# Patient Record
Sex: Male | Born: 1950
Health system: Southern US, Community
[De-identification: ages and names within clinical notes are randomized; demographics above are authoritative.]

## PROBLEM LIST (undated history)

## (undated) DIAGNOSIS — I2510 Atherosclerotic heart disease of native coronary artery without angina pectoris: Secondary | ICD-10-CM

## (undated) DIAGNOSIS — T4145XA Adverse effect of unspecified anesthetic, initial encounter: Secondary | ICD-10-CM

## (undated) DIAGNOSIS — I739 Peripheral vascular disease, unspecified: Secondary | ICD-10-CM

## (undated) DIAGNOSIS — H269 Unspecified cataract: Secondary | ICD-10-CM

## (undated) DIAGNOSIS — K219 Gastro-esophageal reflux disease without esophagitis: Secondary | ICD-10-CM

## (undated) DIAGNOSIS — C801 Malignant (primary) neoplasm, unspecified: Secondary | ICD-10-CM

## (undated) DIAGNOSIS — E119 Type 2 diabetes mellitus without complications: Secondary | ICD-10-CM

## (undated) DIAGNOSIS — E785 Hyperlipidemia, unspecified: Secondary | ICD-10-CM

## (undated) DIAGNOSIS — M1990 Unspecified osteoarthritis, unspecified site: Secondary | ICD-10-CM

## (undated) DIAGNOSIS — I10 Essential (primary) hypertension: Secondary | ICD-10-CM

## (undated) DIAGNOSIS — T8859XA Other complications of anesthesia, initial encounter: Secondary | ICD-10-CM

## (undated) DIAGNOSIS — F419 Anxiety disorder, unspecified: Secondary | ICD-10-CM

## (undated) DIAGNOSIS — G4730 Sleep apnea, unspecified: Secondary | ICD-10-CM

## (undated) HISTORY — DX: Unspecified cataract: H26.9

## (undated) HISTORY — PX: TONSILLECTOMY: SUR1361

## (undated) HISTORY — DX: Unspecified osteoarthritis, unspecified site: M19.90

## (undated) HISTORY — DX: Anxiety disorder, unspecified: F41.9

## (undated) HISTORY — DX: Peripheral vascular disease, unspecified: I73.9

## (undated) HISTORY — PX: MENISCUS REPAIR: SHX5179

## (undated) HISTORY — DX: Atherosclerotic heart disease of native coronary artery without angina pectoris: I25.10

## (undated) HISTORY — PX: CARDIAC SURGERY: SHX584

---

## 1974-10-25 HISTORY — PX: NASAL SINUS SURGERY: SHX719

## 2004-11-25 HISTORY — PX: CORONARY ANGIOPLASTY WITH STENT PLACEMENT: SHX49

## 2014-12-31 ENCOUNTER — Encounter: Payer: Self-pay | Admitting: *Deleted

## 2014-12-31 ENCOUNTER — Emergency Department
Admission: EM | Admit: 2014-12-31 | Discharge: 2014-12-31 | Disposition: A | Discharge status: Home / Self Care | Payer: 59 | Source: Home / Self Care | Attending: Emergency Medicine | Admitting: Emergency Medicine

## 2014-12-31 DIAGNOSIS — J0101 Acute recurrent maxillary sinusitis: Secondary | ICD-10-CM

## 2014-12-31 HISTORY — DX: Type 2 diabetes mellitus without complications: E11.9

## 2014-12-31 HISTORY — DX: Essential (primary) hypertension: I10

## 2014-12-31 HISTORY — DX: Hyperlipidemia, unspecified: E78.5

## 2014-12-31 HISTORY — DX: Gastro-esophageal reflux disease without esophagitis: K21.9

## 2014-12-31 MED ORDER — METHYLPREDNISOLONE 4 MG PO KIT: PACK | ORAL | Status: DC

## 2014-12-31 MED ORDER — TRIAMCINOLONE ACETONIDE 55 MCG/ACT NA AERO: 2.0000 | INHALATION_SPRAY | Freq: Two times a day (BID) | NASAL | Status: DC

## 2014-12-31 MED ORDER — CEFDINIR 300 MG PO CAPS: 300.0000 mg | ORAL_CAPSULE | Freq: Two times a day (BID) | ORAL | Status: DC

## 2014-12-31 NOTE — ED Notes (Signed)
Pt c/o nasal congestion x 3 days. He has taken OTC Coricidin and generic mucus relief with minimal relief.

## 2014-12-31 NOTE — ED Provider Notes (Signed)
CSN: [id number]     Arrival date & time 12/31/14  6283 History   First MD Initiated Contact with Patient 12/31/14 1014     Chief Complaint  Patient presents with  . Nasal Congestion   HPI SINUSITIS  Onset: 3 days Bilateral Facial/sinus pressure with discolored, green nasal mucus.    Severity: Severe Tried OTC Coricidin and Mucinex without significant relief. He admits that he is chronically allergic to Afrin nasal spray for years, using once daily or more often for many years. During this acute sinus episode, he is using Afrin nasal spray every 2-3 hours around the clock.  In the past has had recurrent sinusitis.  Associated Symptoms:  + Fever  + URI prodrome with nasal congestion + Minimal swollen neck glands + Moderate Sinus Headache + mild ear pressure  Review of systems:  No Allergy symptoms + Minimal Sore Throat No eye symptoms     No significant Cough No chest pain No shortness of breath  No wheezing  No Abdominal Pain No Nausea No Vomiting No diarrhea  No Myalgias No focal neurologic symptoms No syncope No Rash  No Urinary symptoms    Past Medical History  Diagnosis Date  . Diabetes mellitus without complication   . Hypertension   . Hyperlipidemia   . GERD (gastroesophageal reflux disease)    Past Surgical History  Procedure Laterality Date  . Tonsillectomy    . Meniscus repair Right   . Nasal sinus surgery    . Cardiac surgery     Family History  Problem Relation Age of Onset  . Diabetes Mother   . Hypertension Mother   . Hyperlipidemia Mother   . Alcohol abuse Brother    History  Substance Use Topics  . Smoking status: Former Research scientist (life sciences)  . Smokeless tobacco: Not on file  . Alcohol Use: Yes     Comment: 2-4 q year    Review of Systems See above Allergies  Dexilant  Home Medications   Prior to Admission medications   Medication Sig Start Date End Date Taking? Authorizing Provider  amLODipine (NORVASC) 5 MG tablet Take 5 mg by  mouth daily.   Yes Historical Provider, MD  aspirin 81 MG tablet Take 81 mg by mouth daily.   Yes Historical Provider, MD  atorvastatin (LIPITOR) 80 MG tablet Take 80 mg by mouth daily.   Yes Historical Provider, MD  busPIRone (BUSPAR) 10 MG tablet Take 10 mg by mouth 3 (three) times daily.   Yes Historical Provider, MD  carvedilol (COREG) 25 MG tablet Take 25 mg by mouth 2 (two) times daily with a meal.   Yes Historical Provider, MD  Cholecalciferol (VITAMIN D-3 PO) Take by mouth.   Yes Historical Provider, MD  clopidogrel (PLAVIX) 75 MG tablet Take 75 mg by mouth daily.   Yes Historical Provider, MD  co-enzyme Q-10 30 MG capsule Take 30 mg by mouth 3 (three) times daily.   Yes Historical Provider, MD  Cyanocobalamin (VITAMIN B-12) 2500 MCG SUBL Place under the tongue.   Yes Historical Provider, MD  fenofibrate (TRICOR) 145 MG tablet Take 145 mg by mouth daily.   Yes Historical Provider, MD  finasteride (PROSCAR) 5 MG tablet Take 5 mg by mouth daily.   Yes Historical Provider, MD  Fish Oil-Cholecalciferol (FISH OIL + D3 PO) Take by mouth.   Yes Historical Provider, MD  lisinopril (PRINIVIL,ZESTRIL) 40 MG tablet Take 40 mg by mouth daily.   Yes Historical Provider, MD  metFORMIN (GLUCOPHAGE) 1000  MG tablet Take 1,000 mg by mouth 2 (two) times daily with a meal.   Yes Historical Provider, MD  metoprolol succinate (TOPROL-XL) 50 MG 24 hr tablet Take 50 mg by mouth daily. Take with or immediately following a meal.   Yes Historical Provider, MD  MULTIPLE VITAMIN PO Take by mouth.   Yes Historical Provider, MD  pantoprazole (PROTONIX) 40 MG tablet Take 40 mg by mouth daily.   Yes Historical Provider, MD  ranitidine (ZANTAC) 300 MG capsule Take 300 mg by mouth every evening.   Yes Historical Provider, MD  cefdinir (OMNICEF) 300 MG capsule Take 1 capsule (300 mg total) by mouth 2 (two) times daily. X 10 days 12/31/14   Jacqulyn Cane, MD  methylPREDNISolone (MEDROL DOSEPAK) 4 MG tablet Take as directed for 6  days 12/31/14   Jacqulyn Cane, MD  triamcinolone (NASACORT ALLERGY 24HR) 55 MCG/ACT AERO nasal inhaler Place 2 sprays into the nose 2 (two) times daily. 12/31/14   Jacqulyn Cane, MD   BP 139/86 mmHg  Pulse 104  Temp(Src) 98.1 F (36.7 C) (Oral)  Resp 18  Ht 5\' 9"  (1.753 m)  Wt 278 lb (126.1 kg)  BMI 41.03 kg/m2  SpO2 96% Physical Exam  Constitutional: He is oriented to person, place, and time. He appears well-developed and well-nourished. No distress.  HENT:  Head: Normocephalic and atraumatic.  Right Ear: Tympanic membrane, external ear and ear canal normal.  Left Ear: Tympanic membrane, external ear and ear canal normal.  Nose: Mucosal edema and rhinorrhea present. Right sinus exhibits maxillary sinus tenderness. Left sinus exhibits maxillary sinus tenderness.  Mouth/Throat: Oropharynx is clear and moist. No oral lesions. No oropharyngeal exudate.  Eyes: Right eye exhibits no discharge. Left eye exhibits no discharge. No scleral icterus.  Neck: Neck supple.  Cardiovascular: Normal rate, regular rhythm and normal heart sounds.   Pulmonary/Chest: Effort normal and breath sounds normal. He has no wheezes. He has no rales.  Lymphadenopathy:    He has no cervical adenopathy.  Neurological: He is alert and oriented to person, place, and time.  Skin: Skin is warm and dry.  Nursing note and vitals reviewed.   ED Course  Procedures (including critical care time) Labs Review Labs Reviewed - No data to display  Imaging Review No results found.   MDM   1. Acute recurrent maxillary sinusitis   Treatment options discussed, as well as risks, benefits, alternatives. He declined any imaging today. Patient voiced understanding and agreement with the following plans:  Omnicef 300 mg twice a day 10 days Medrol 4 mg tablet-6 day Dosepak--given his severe symptoms and Afrin addiction, I feel the benefits of this outweigh the risks. (I advised steroid nasal spray such as Flonase. He declined  Flonase specifically because of the odor of that product) Therefore, prescribed Nasacort nasal spray, 2 sprays each nostril twice a day for 1 week, then decrease to 2 sprays each nostril daily.--I explained that it takes several days for the Nasacort nasal spray to achieve benefit. Wean off chronic Afrin nasal spray. Follow-up with ENT in 7-10 days, sooner if worse or new symptoms.  Jacqulyn Cane, MD 12/31/14 [phone]

## 2015-01-06 ENCOUNTER — Ambulatory Visit (INDEPENDENT_AMBULATORY_CARE_PROVIDER_SITE_OTHER): Payer: 59 | Admitting: Family Medicine

## 2015-01-06 ENCOUNTER — Encounter: Payer: Self-pay | Admitting: Family Medicine

## 2015-01-06 VITALS — BP 149/87 | HR 74 | Wt 277.0 lb

## 2015-01-06 DIAGNOSIS — I739 Peripheral vascular disease, unspecified: Secondary | ICD-10-CM | POA: Insufficient documentation

## 2015-01-06 DIAGNOSIS — Z955 Presence of coronary angioplasty implant and graft: Secondary | ICD-10-CM

## 2015-01-06 DIAGNOSIS — I2510 Atherosclerotic heart disease of native coronary artery without angina pectoris: Secondary | ICD-10-CM | POA: Insufficient documentation

## 2015-01-06 DIAGNOSIS — E119 Type 2 diabetes mellitus without complications: Secondary | ICD-10-CM

## 2015-01-06 DIAGNOSIS — I10 Essential (primary) hypertension: Secondary | ICD-10-CM

## 2015-01-06 DIAGNOSIS — E785 Hyperlipidemia, unspecified: Secondary | ICD-10-CM

## 2015-01-06 MED ORDER — CILOSTAZOL 100 MG PO TABS: 100.0000 mg | ORAL_TABLET | Freq: Two times a day (BID) | ORAL | Status: DC

## 2015-01-06 NOTE — Progress Notes (Signed)
CC: David Singh is a 64 y.o. male is here for Establish Care   Subjective: HPI:  Very pleasant 64 year old here to establish care  Patient reports a history of type 2 diabetes: He is uncertain when this was originally diagnosed. He's been taking metformin twice a day for at least a year now. He believes his A1c was checked 3 months ago and he is uncertain what the results were. No polyuria polyphagia or polydipsia. He tries to exercise on a daily basis but is limited by limb claudication  Reports a history of coronary artery disease requiring a stent many years ago. He was seen a cardiologist in [place] every 6 months and was getting at least an annual nuclear stress test he reports that all of them have been stable over the past 1 or 2 years. Denies any exertional chest pain or chest pain in general.  He tells me that he was administered with peripheral artery disease in the femoral artery on the right about 3 months ago. After 1.5 years of exertional buttock and thigh aching with walking he was seen by a vascular surgeon 3 months ago and was told that he wasn't walking enough. He tells me that within 1 or 2 minutes of walking on a hill he has to stop for a few minutes to let the pain subside. He believes that the pain is getting worse on a monthly basis. Denies any rest pain. No interventions other than above. He takes Plavix and statin.  Reports a history of hyperlipidemia. He is uncertain when his cholesterol was checked last.  He is on 80 mg of Lipitor without any right upper quadrant pain or myalgias other than the discomfort described above.  Essential hypertension: He is currently taking amlodipine, metoprolol, lisinopril on a daily basis. His medication list contains carvedilol however he is uncertain whether or not he is taking this at home.  Review of Systems - General ROS: negative for - chills, fever, night sweats, weight gain or weight loss Ophthalmic ROS: negative for - decreased  vision Psychological ROS: negative for - anxiety or depression ENT ROS: negative for - hearing change, nasal congestion, tinnitus or allergies Hematological and Lymphatic ROS: negative for - bleeding problems, bruising or swollen lymph nodes Breast ROS: negative Respiratory ROS: no cough, shortness of breath, or wheezing Cardiovascular ROS: no chest pain or dyspnea on exertion Gastrointestinal ROS: no abdominal pain, change in bowel habits, or black or bloody stools Genito-Urinary ROS: negative for - genital discharge, genital ulcers, incontinence or abnormal bleeding from genitals Musculoskeletal ROS: negative for - joint pain  Neurological ROS: negative for - headaches or memory loss Dermatological ROS: negative for lumps, mole changes, rash and skin lesion changes  Past Medical History  Diagnosis Date  . Diabetes mellitus without complication   . Hypertension   . Hyperlipidemia   . GERD (gastroesophageal reflux disease)     Past Surgical History  Procedure Laterality Date  . Tonsillectomy    . Meniscus repair Right   . Nasal sinus surgery    . Cardiac surgery     Family History  Problem Relation Age of Onset  . Diabetes Mother   . Hypertension Mother   . Hyperlipidemia Mother   . Alcohol abuse Brother     History   Social History  . Marital Status: Married    Spouse Name: N/A  . Number of Children: N/A  . Years of Education: N/A   Occupational History  . Not on file.  Social History Main Topics  . Smoking status: Former Research scientist (life sciences)  . Smokeless tobacco: Not on file  . Alcohol Use: Yes     Comment: 2-4 q year  . Drug Use: No  . Sexual Activity: Not on file   Other Topics Concern  . Not on file   Social History Narrative     Objective: BP 149/87 mmHg  Pulse 74  Wt 277 lb (125.646 kg)  General: Alert and Oriented, No Acute Distress HEENT: Pupils equal, round, reactive to light. Conjunctivae clear.  Moist mucous membranes Lungs: Clear to auscultation  bilaterally, no wheezing/ronchi/rales.  Comfortable work of breathing. Good air movement. Cardiac: Regular rate and rhythm. Normal S1/S2.  No murmurs, rubs, nor gallops. No carotid bruit  Abdomen: Obese and soft Extremities: No peripheral edema.  Faint peripheral pulses in the lower extremities.  Mental Status: No depression, anxiety, nor agitation. Skin: Warm and dry.  Assessment & Plan: Johathon was seen today for establish care.  Diagnoses and all orders for this visit:  Type 2 diabetes mellitus without complication Orders: -     BASIC METABOLIC PANEL WITH GFR -     Hemoglobin A1c  CAD in native artery Orders: -     Ambulatory referral to Cardiology  History of coronary artery stent placement Orders: -     Ambulatory referral to Cardiology  PAD (peripheral artery disease) Orders: -     Lipid panel  Hyperlipidemia Orders: -     Lipid panel  Essential hypertension  Bilateral claudication of lower limb Orders: -     Ambulatory referral to Vascular Surgery -     cilostazol (PLETAL) 100 MG tablet; Take 1 tablet (100 mg total) by mouth 2 (two) times daily.   Type 2 diabetes: Clinically controlled due for A1c and renal function. He is not fasting today but will return tomorrow morning Coronary artery disease: Currently stable referral to cardiology for further management. Continue beta blocker, statin, Plavix Peripheral artery disease with symptomatic claudication. Starting pletal, encouraged to continue walking and referral to vascular surgery for a second opinion, if the above medication does not help I think that he may need stenting in the lower extremities. Essential hypertension: Uncontrolled, discussed DASH diet prior to adjusting antihypertensives. I've asked him to stop taking carvedilol if he is taking metoprolol. Advised that he does not need to take ranitidine if he is taking Protonix and reflux symptoms are controlled with Protonix alone. Hyperlipidemia: Overdue  for lipid panel continue atorvastatin pending results, of note he cannot afford fenofibrate  Return in about 3 months (around 04/08/2015).

## 2015-01-10 ENCOUNTER — Other Ambulatory Visit: Payer: Self-pay

## 2015-01-10 DIAGNOSIS — I739 Peripheral vascular disease, unspecified: Secondary | ICD-10-CM

## 2015-01-16 ENCOUNTER — Telehealth: Payer: Self-pay | Admitting: Family Medicine

## 2015-01-16 DIAGNOSIS — E781 Pure hyperglyceridemia: Secondary | ICD-10-CM

## 2015-01-16 DIAGNOSIS — E785 Hyperlipidemia, unspecified: Secondary | ICD-10-CM

## 2015-01-16 LAB — BASIC METABOLIC PANEL WITH GFR
BUN: 13 mg/dL (ref 6–23)
CHLORIDE: 100 meq/L (ref 96–112)
CO2: 28 meq/L (ref 19–32)
Calcium: 9.4 mg/dL (ref 8.4–10.5)
Creat: 0.75 mg/dL (ref 0.50–1.35)
GFR, Est Non African American: >89 mL/min
GLUCOSE: 115 mg/dL — AB (ref 70–99)
POTASSIUM: 4.4 meq/L (ref 3.5–5.3)
SODIUM: 139 meq/L (ref 135–145)

## 2015-01-16 LAB — LIPID PANEL
CHOL/HDL RATIO: 4.7 ratio
Cholesterol: 159 mg/dL (ref 0–200)
HDL: 34 mg/dL — ABNORMAL LOW (ref >=40)
LDL Cholesterol: 75 mg/dL (ref 0–99)
Triglycerides: 252 mg/dL — ABNORMAL HIGH (ref <150)
VLDL: 50 mg/dL — ABNORMAL HIGH (ref 0–40)

## 2015-01-16 LAB — HEMOGLOBIN A1C
HEMOGLOBIN A1C: 6.8 % — AB (ref <5.7)
Mean Plasma Glucose: 148 mg/dL — ABNORMAL HIGH (ref <117)

## 2015-01-16 MED ORDER — ICOSAPENT ETHYL 1 G PO CAPS: 2.0000 g | ORAL_CAPSULE | Freq: Two times a day (BID) | ORAL | Status: DC

## 2015-01-16 NOTE — Telephone Encounter (Signed)
Andrea/Coverage, Will you please let patient know that his A1c was at goal at 6.8, liver and kidney function were normal.  LDL cholesterol was satisfactory and triglycerides were moderately elevated.  I know he's already taking fish oil but I'd recommend switching to a different formulation of fish oil that's more purified.  It's a Rx named Vascepa which I've sent to his [facility], this usually makes a huge difference for an individual's triglycerides.  It's best to get this Rx with a savings voucher we have here in the closet, can you please provide him with one of these?

## 2015-01-21 NOTE — Telephone Encounter (Signed)
Pt notified and he states the medication for his circulation is not helping FYI

## 2015-01-21 NOTE — Telephone Encounter (Signed)
He may as well stop taking it then as long as he's referring to cliostazol.  I still think it's very important to meet with the vascular surgeon and I referred him to earlier this month, please let me know if he is not been notified by now regarding a appointment.

## 2015-01-22 NOTE — Telephone Encounter (Signed)
Left message on vm

## 2015-01-23 ENCOUNTER — Telehealth: Payer: Self-pay | Admitting: Family Medicine

## 2015-01-23 NOTE — Telephone Encounter (Signed)
Received fax for prior auth on Vascepa 1 gm capsules sent through cover my meds waiting on auth. - CF

## 2015-01-24 ENCOUNTER — Telehealth: Payer: Self-pay | Admitting: *Deleted

## 2015-01-24 ENCOUNTER — Other Ambulatory Visit: Payer: Self-pay | Admitting: Family Medicine

## 2015-01-24 MED ORDER — FINASTERIDE 5 MG PO TABS: 5.0000 mg | ORAL_TABLET | Freq: Every day | ORAL | Status: DC

## 2015-01-24 NOTE — Telephone Encounter (Signed)
Patient called wondering if he would be still on Finasteride 5 mg. I called the patient to inform that   Dr Ileene Rubens wasn't the prescribing doctor & that I am sending medication refill request.

## 2015-01-24 NOTE — Telephone Encounter (Signed)
REfill appropriate, I sent this to his [facility] in [place].

## 2015-01-24 NOTE — Telephone Encounter (Signed)
Pharmacy sent fax for refill on Proscar. Please advise if this medication is appropriate as it was entered by a historical provider.

## 2015-01-27 ENCOUNTER — Telehealth: Payer: Self-pay | Admitting: Family Medicine

## 2015-01-27 DIAGNOSIS — E781 Pure hyperglyceridemia: Secondary | ICD-10-CM

## 2015-01-27 MED ORDER — NIACIN ER (ANTIHYPERLIPIDEMIC) 500 MG PO TBCR: EXTENDED_RELEASE_TABLET | ORAL | Status: DC

## 2015-01-27 NOTE — Telephone Encounter (Signed)
Left message on vm

## 2015-01-27 NOTE — Telephone Encounter (Signed)
Received fax on vascepa and medication has been denied - CF

## 2015-01-27 NOTE — Telephone Encounter (Signed)
David Singh, Will you please let patient know that [place] is not providing coverage for Vascepa.  To avoid having to pay hundreds of dollars out of pocket I'd reocmmend we try a different agent to lower his triglycerides.  I've sent an Rx of niacin to [facility] to help with this, he can go back to taking regular OTC fish oil with this medication.

## 2015-01-30 ENCOUNTER — Telehealth: Payer: Self-pay | Admitting: *Deleted

## 2015-01-30 NOTE — Telephone Encounter (Signed)
Pt called and states the Niacin in still $100 out of pocket. He called his insurance company  And colestipol is only $10 out of pocket. He wanted to know if this could be rx;ed instead

## 2015-01-31 MED ORDER — COLESTIPOL HCL 5 G PO GRAN: 5.0000 g | GRANULES | Freq: Every day | ORAL | Status: DC

## 2015-01-31 NOTE — Telephone Encounter (Signed)
Pt notified. Pt was to see a cardiovascular surgeon and at the last min the office canceled his appt and referred him to a Dr. Bobette Mo. He wanted hommel to know this

## 2015-01-31 NOTE — Telephone Encounter (Signed)
Yes this is an acceptable alternative, Rx sent to [facility] in [place].

## 2015-02-12 ENCOUNTER — Ambulatory Visit: Payer: 59 | Admitting: Cardiology

## 2015-02-13 ENCOUNTER — Encounter: Payer: Self-pay | Admitting: Vascular Surgery

## 2015-02-14 ENCOUNTER — Ambulatory Visit (INDEPENDENT_AMBULATORY_CARE_PROVIDER_SITE_OTHER): Payer: 59 | Admitting: Vascular Surgery

## 2015-02-14 ENCOUNTER — Ambulatory Visit (INDEPENDENT_AMBULATORY_CARE_PROVIDER_SITE_OTHER)
Admission: RE | Admit: 2015-02-14 | Discharge: 2015-02-14 | Disposition: A | Discharge status: Home / Self Care | Payer: 59 | Source: Ambulatory Visit | Attending: Vascular Surgery | Admitting: Vascular Surgery

## 2015-02-14 ENCOUNTER — Encounter: Payer: Self-pay | Admitting: Vascular Surgery

## 2015-02-14 ENCOUNTER — Ambulatory Visit (HOSPITAL_COMMUNITY)
Admission: RE | Admit: 2015-02-14 | Discharge: 2015-02-14 | Disposition: A | Discharge status: Home / Self Care | Payer: 59 | Source: Ambulatory Visit | Attending: Vascular Surgery | Admitting: Vascular Surgery

## 2015-02-14 VITALS — Ht 69.0 in | Wt 280.3 lb

## 2015-02-14 DIAGNOSIS — I70219 Atherosclerosis of native arteries of extremities with intermittent claudication, unspecified extremity: Secondary | ICD-10-CM

## 2015-02-14 DIAGNOSIS — I739 Peripheral vascular disease, unspecified: Secondary | ICD-10-CM

## 2015-02-14 NOTE — Progress Notes (Signed)
[facility]  Patient name: David Singh MRN: [id number] DOB: 10-18-1951 Sex: male  REASON FOR CONSULT: Claudication. Referred by Dr. Nicoletta Ba.  HPI: David Singh is a 64 y.o. male with a long history of claudication. He experiences significant pain in his right thigh initially and this later involves his hips until a lesser degree his right calf. His symptoms are brought on by ambulation and relieved with rest. He experiences some symptoms on the left however his symptoms are predominantly on the right side. He states he can walk approximately the distance of 3-4 houses for experiencing symptoms. The symptoms have gradually progressed. There are no other aggravating or alleviating factors. He denies any history of rest pain. He denies any history of nonhealing ulcers. He was on Pletal for 2 weeks but did not think that this helped and therefore stopped taking this.  I reviewed the patient's records from Dr. Lewis Moccasin office. The patient has type 2 diabetes. This has been under good control. The patient does have a history of coronary artery disease with no recent symptoms. The patient also has essential hypertension. The patient is on Pletal for his peripheral vascular disease.   Past Medical History  Diagnosis Date  . Diabetes mellitus without complication   . Hypertension   . Hyperlipidemia   . GERD (gastroesophageal reflux disease)   . Peripheral vascular disease    Family History  Problem Relation Age of Onset  . Diabetes Mother   . Hypertension Mother   . Hyperlipidemia Mother   . Heart attack Mother   . Alcohol abuse Brother   . Cancer Brother   . Diabetes Brother   . Hyperlipidemia Brother   . Hypertension Brother   . Hyperlipidemia Father   . Hypertension Father    SOCIAL HISTORY: History  Substance Use Topics  . Smoking status: Former Research scientist (life sciences)  . Smokeless tobacco: Not on file  . Alcohol Use: 0.0 oz/week    0 Standard drinks or  equivalent per week     Comment: 2-8 times per year   Allergies  Allergen Reactions  . Dexilant [Dexlansoprazole]     Pressure in stomach and chest   Current Outpatient Prescriptions  Medication Sig Dispense Refill  . amLODipine (NORVASC) 5 MG tablet Take 5 mg by mouth daily.    Marland Kitchen aspirin 81 MG tablet Take 81 mg by mouth daily.    Marland Kitchen atorvastatin (LIPITOR) 80 MG tablet Take 80 mg by mouth daily.    . busPIRone (BUSPAR) 10 MG tablet Take 10 mg by mouth 3 (three) times daily.    . Cholecalciferol (VITAMIN D-3 PO) Take by mouth.    . cilostazol (PLETAL) 100 MG tablet Take 1 tablet (100 mg total) by mouth 2 (two) times daily. 60 tablet 2  . clopidogrel (PLAVIX) 75 MG tablet Take 75 mg by mouth daily.    Marland Kitchen co-enzyme Q-10 30 MG capsule Take 30 mg by mouth 3 (three) times daily.    . colestipol (COLESTID) 5 G granules Take 5 g by mouth daily. 300 g 12  . Cyanocobalamin (VITAMIN B-12) 2500 MCG SUBL Place under the tongue.    . finasteride (PROSCAR) 5 MG tablet Take 1 tablet (5 mg total) by mouth daily. 90 tablet 1  . Fish Oil-Cholecalciferol (FISH OIL + D3 PO) Take by mouth.    Marland Kitchen lisinopril (PRINIVIL,ZESTRIL) 40 MG tablet Take 40 mg by mouth daily.    . metFORMIN (GLUCOPHAGE) 1000 MG tablet Take 1,000 mg by  mouth 2 (two) times daily with a meal.    . metoprolol succinate (TOPROL-XL) 50 MG 24 hr tablet Take 50 mg by mouth daily. Take with or immediately following a meal.    . MULTIPLE VITAMIN PO Take by mouth.    . pantoprazole (PROTONIX) 40 MG tablet Take 40 mg by mouth daily.    Marland Kitchen triamcinolone (NASACORT ALLERGY 24HR) 55 MCG/ACT AERO nasal inhaler Place 2 sprays into the nose 2 (two) times daily. 1 Inhaler 12   No current facility-administered medications for this visit.   REVIEW OF SYSTEMS: Valu.Nieves ] denotes positive finding; [  ] denotes negative finding  CARDIOVASCULAR:  [ ]  chest pain   [ ]  chest pressure   [ ]  palpitations   [ ]  orthopnea   Valu.Nieves ] dyspnea on exertion   Valu.Nieves ] claudication   [ ]   rest pain   [ ]  DVT   [ ]  phlebitis PULMONARY:   [ ]  productive cough   [ ]  asthma   [ ]  wheezing NEUROLOGIC:   [ ]  weakness  [ ]  paresthesias  [ ]  aphasia  [ ]  amaurosis  [ ]  dizziness HEMATOLOGIC:   [ ]  bleeding problems   [ ]  clotting disorders MUSCULOSKELETAL:  [ ]  joint pain   [ ]  joint swelling [ ]  leg swelling GASTROINTESTINAL: [ ]   blood in stool  [ ]   hematemesis GENITOURINARY:  [ ]   dysuria  [ ]   hematuria PSYCHIATRIC:  [ ]  history of major depression INTEGUMENTARY:  [ ]  rashes  [ ]  ulcers CONSTITUTIONAL:  [ ]  fever   [ ]  chills  PHYSICAL EXAM: Filed Vitals:   02/14/15 1401  Height: 5\' 9"  (1.753 m)  Weight: 280 lb 4.8 oz (127.143 kg)   GENERAL: The patient is a well-nourished male, in no acute distress. The vital signs are documented above. CARDIOVASCULAR: There is a regular rate and rhythm. I do not detect carotid bruits. He has a diminished right femoral pulse. He has a normal left femoral pulse. On the left lower extremity has palpable popliteal posterior tibial and dorsalis pedis pulse. He has a barely palpable right dorsalis pedis pulse. He has no significant lower extremity swelling. PULMONARY: There is good air exchange bilaterally without wheezing or rales. ABDOMEN: Soft and non-tender with normal pitched bowel sounds. I do not palpate an abdominal aortic aneurysm although his abdomen is difficult to assess because of his size. MUSCULOSKELETAL: There are no major deformities or cyanosis. NEUROLOGIC: No focal weakness or paresthesias are detected. SKIN: There are no ulcers or rashes noted. PSYCHIATRIC: The patient has a normal affect.  DATA:  I have independently interpreted his arterial Doppler study today which shows an ABI of 86% on the right. At rest he has a biphasic posterior tibial and dorsalis pedis signal on the right. On the left side he has an ABI of 100% with biphasic signals in the dorsalis pedis and posterior tibial positions.  Independently interpreted his  duplex scan which shows no significant infrainguinal arterial occlusive disease. Of note he has a monophasic common femoral artery waveform on the right with a triphasic common femoral artery waveform on the left.  MEDICAL ISSUES: PERIPHERAL VASCULAR DISEASE WITH CLAUDICATION: Based on his exam and Doppler studies he has evidence of right iliac artery occlusive disease. Discussed the importance of a structured walking program. Fortunately he quit tobacco 20 years ago. I've explained that if his symptoms were significantly disabling we could consider arteriography in order to determine  if he has an iliac stenosis amenable to angioplasty. However currently he feels the symptoms are tolerable. I ordered a follow up study in 6 months and I will see him back at that time. He knows to call sooner if he has problems. We also discussed the potential use of Pletal although he did not think this was especially helpful. I did explain that he would have to stay on it for at least a month to really know if it was beneficial.  DICKSON,CHRISTOPHER S [facility] [place]: [phone]

## 2015-02-18 NOTE — Addendum Note (Signed)
Addended by: Mena Goes on: 02/18/2015 03:19 PM   Modules accepted: Orders

## 2015-02-25 ENCOUNTER — Encounter: Payer: 59 | Admitting: Vascular Surgery

## 2015-02-25 ENCOUNTER — Encounter (HOSPITAL_COMMUNITY): Payer: 59

## 2015-02-25 ENCOUNTER — Other Ambulatory Visit (HOSPITAL_COMMUNITY): Payer: 59

## 2015-03-17 ENCOUNTER — Other Ambulatory Visit: Payer: Self-pay | Admitting: Family Medicine

## 2015-03-17 MED ORDER — AMLODIPINE BESYLATE 5 MG PO TABS: 5.0000 mg | ORAL_TABLET | Freq: Every day | ORAL | Status: DC

## 2015-03-17 MED ORDER — ATORVASTATIN CALCIUM 80 MG PO TABS: 80.0000 mg | ORAL_TABLET | Freq: Every day | ORAL | Status: DC

## 2015-03-17 NOTE — Telephone Encounter (Signed)
David Singh, Appropriate, I've sent refills to [facility] in [place]

## 2015-03-17 NOTE — Telephone Encounter (Addendum)
Received fax from pharmacy for refill on Amlodipine and Lipitor. These Rx's were entered by a historical provider. Please advise if refill is appropriate, thank you.

## 2015-03-19 ENCOUNTER — Telehealth: Payer: Self-pay | Admitting: Family Medicine

## 2015-03-19 NOTE — Telephone Encounter (Signed)
New Message  We are sending this correspondence to inform your office that we have attempted to contact the patient on three separate attempts to schedule per referral. We were unsuccessful in our attempts to reschedule and wanted you to be aware of our efforts.  Thank you!  [facility]  Patient care coordinator

## 2015-04-08 ENCOUNTER — Ambulatory Visit (INDEPENDENT_AMBULATORY_CARE_PROVIDER_SITE_OTHER): Payer: 59 | Admitting: Family Medicine

## 2015-04-08 ENCOUNTER — Encounter: Payer: Self-pay | Admitting: Family Medicine

## 2015-04-08 VITALS — BP 147/86 | HR 89 | Wt 283.0 lb

## 2015-04-08 DIAGNOSIS — E119 Type 2 diabetes mellitus without complications: Secondary | ICD-10-CM

## 2015-04-08 DIAGNOSIS — I739 Peripheral vascular disease, unspecified: Secondary | ICD-10-CM

## 2015-04-08 DIAGNOSIS — E781 Pure hyperglyceridemia: Secondary | ICD-10-CM | POA: Diagnosis not present

## 2015-04-08 DIAGNOSIS — I10 Essential (primary) hypertension: Secondary | ICD-10-CM

## 2015-04-08 MED ORDER — AMLODIPINE BESYLATE 10 MG PO TABS: 10.0000 mg | ORAL_TABLET | Freq: Every day | ORAL | Status: DC

## 2015-04-08 MED ORDER — COLESTIPOL HCL 1 G PO TABS: 2.0000 g | ORAL_TABLET | Freq: Two times a day (BID) | ORAL | Status: DC

## 2015-04-08 NOTE — Progress Notes (Signed)
CC: David Singh is a 64 y.o. male is here for Establish Care   Subjective: HPI:  Follow-up type 2 diabetes: Continues to take metformin daily without known side effects. No outside blood sugars to report. No formal exercise routine but trying to watch what he eats. No polyuria polyphagia or polydipsia  Follow peripheral artery disease: He tried Pletal for at least a month but did not notice any benefit so he stopped taking it. He still has calf and thigh claudication if walking more than a quarter of a mile. He's had difficulty getting beyond a quarter to half a mile due to the degree of pain that he experiences at this distance even after resting. He denies any chest pain or shortness of breath.  Follow-up hypercholesterolemia: He was unable to consume colestid a regular basis due to gagging on the powder despite trying different liquids to dilute this in. He denies right upper quadrant pain or epigastric pain. No new back pain.   Review Of Systems Outlined In HPI  Past Medical History  Diagnosis Date  . Diabetes mellitus without complication   . Hypertension   . Hyperlipidemia   . GERD (gastroesophageal reflux disease)   . Peripheral vascular disease     Past Surgical History  Procedure Laterality Date  . Tonsillectomy    . Meniscus repair Right   . Nasal sinus surgery    . Cardiac surgery     Family History  Problem Relation Age of Onset  . Diabetes Mother   . Hypertension Mother   . Hyperlipidemia Mother   . Heart attack Mother   . Alcohol abuse Brother   . Cancer Brother   . Diabetes Brother   . Hyperlipidemia Brother   . Hypertension Brother   . Hyperlipidemia Father   . Hypertension Father     History   Social History  . Marital Status: Married    Spouse Name: N/A  . Number of Children: N/A  . Years of Education: N/A   Occupational History  . Not on file.   Social History Main Topics  . Smoking status: Former Research scientist (life sciences)  . Smokeless tobacco: Not on file   . Alcohol Use: 0.0 oz/week    0 Standard drinks or equivalent per week     Comment: 2-8 times per year  . Drug Use: No  . Sexual Activity: Not on file   Other Topics Concern  . Not on file   Social History Narrative     Objective: BP 147/86 mmHg  Pulse 89  Wt 283 lb (128.368 kg)  General: Alert and Oriented, No Acute Distress HEENT: Pupils equal, round, reactive to light. Conjunctivae clear.  Moist mucous membranes Lungs: Clear to auscultation bilaterally, no wheezing/ronchi/rales.  Comfortable work of breathing. Good air movement. Cardiac: Regular rate and rhythm. Normal S1/S2.  No murmurs, rubs, nor gallops.   Abdomen: Obese and soft Extremities: No peripheral edema.  Strong peripheral pulses.  Mental Status: No depression, anxiety, nor agitation. Skin: Warm and dry.  Assessment & Plan: David Singh was seen today for establish care.  Diagnoses and all orders for this visit:  Type 2 diabetes mellitus without complication Orders: -     Hemoglobin A1c  PAD (peripheral artery disease)  Hypertriglyceridemia Orders: -     Lipid panel  Essential hypertension  Other orders -     colestipol (COLESTID) 1 G tablet; Take 2 tablets (2 g total) by mouth 2 (two) times daily. -     amLODipine (NORVASC) 10  MG tablet; Take 1 tablet (10 mg total) by mouth daily.   Type 2 diabetes: A1c is not due until next month, lab slip was printed, clinically appears to be controlled therefore continue metformin Peripheral artery disease: Still symptomatic, encouraged him to continue walking on a daily basis and to take it slow to focus on endurance as opposed to quickly getting over with. Continues on aspirin. Hypertriglyceridemia: Due for repeat lipid panel however since she's not been taking colestid a daily basis I've asked him to hold off on getting lipid panel until next month provided he can start the new tablet form of this medication on a daily basis. Essential hypertension: Uncontrolled  chronic condition increasing Norvasc to 10 mg daily  Return in about 3 months (around 07/09/2015).

## 2015-04-29 ENCOUNTER — Other Ambulatory Visit: Payer: Self-pay | Admitting: Family Medicine

## 2015-04-29 MED ORDER — METFORMIN HCL 1000 MG PO TABS: 1000.0000 mg | ORAL_TABLET | Freq: Two times a day (BID) | ORAL | Status: DC

## 2015-05-27 ENCOUNTER — Telehealth: Payer: Self-pay | Admitting: Family Medicine

## 2015-05-27 LAB — LIPID PANEL
CHOLESTEROL: 150 mg/dL (ref 125–200)
HDL: 31 mg/dL — ABNORMAL LOW (ref >=40)
LDL Cholesterol: 61 mg/dL (ref <130)
Total CHOL/HDL Ratio: 4.8 Ratio (ref <=5.0)
Triglycerides: 291 mg/dL — ABNORMAL HIGH (ref <150)
VLDL: 58 mg/dL — ABNORMAL HIGH (ref <30)

## 2015-05-27 LAB — HEMOGLOBIN A1C
HEMOGLOBIN A1C: 6.9 % — AB (ref <5.7)
Mean Plasma Glucose: 151 mg/dL — ABNORMAL HIGH (ref <117)

## 2015-05-27 MED ORDER — COLESTIPOL HCL 1 G PO TABS: 4.0000 g | ORAL_TABLET | Freq: Two times a day (BID) | ORAL | Status: DC

## 2015-05-27 NOTE — Telephone Encounter (Signed)
Pt notified and pt will come back in 3 months to recheck chol and A1c

## 2015-05-27 NOTE — Telephone Encounter (Signed)
David Singh, Will you please let patient know that his LDL cholesterol looks great but his triglycerides remain elevated.  I'd recommend increasing his colestipol to four tablets twice a day.  A Rx for this was sent to his [facility] in [place]. I'd recommend rechecking this in two months. His A1c was controlled at 6.9.

## 2015-06-02 ENCOUNTER — Ambulatory Visit: Payer: 59 | Admitting: Family Medicine

## 2015-06-09 ENCOUNTER — Ambulatory Visit: Payer: 59 | Admitting: Family Medicine

## 2015-07-02 ENCOUNTER — Other Ambulatory Visit: Payer: Self-pay | Admitting: *Deleted

## 2015-07-02 MED ORDER — AMLODIPINE BESYLATE 10 MG PO TABS: 10.0000 mg | ORAL_TABLET | Freq: Every day | ORAL | Status: DC

## 2015-07-03 ENCOUNTER — Other Ambulatory Visit: Payer: Self-pay | Admitting: Family Medicine

## 2015-07-04 MED ORDER — LISINOPRIL 40 MG PO TABS: 40.0000 mg | ORAL_TABLET | Freq: Every day | ORAL | Status: DC

## 2015-07-18 ENCOUNTER — Other Ambulatory Visit: Payer: Self-pay | Admitting: Family Medicine

## 2015-07-18 MED ORDER — BUSPIRONE HCL 10 MG PO TABS: 10.0000 mg | ORAL_TABLET | Freq: Three times a day (TID) | ORAL | Status: DC

## 2015-07-31 ENCOUNTER — Other Ambulatory Visit: Payer: Self-pay | Admitting: Family Medicine

## 2015-08-06 ENCOUNTER — Telehealth: Payer: Self-pay | Admitting: Family Medicine

## 2015-08-06 DIAGNOSIS — I70219 Atherosclerosis of native arteries of extremities with intermittent claudication, unspecified extremity: Secondary | ICD-10-CM

## 2015-08-06 NOTE — Telephone Encounter (Signed)
We received a Fax from [place] [facility] and they need a referral entered for this patient and Diagnosis code is I70.219 and patient is going to See Dr. Deitra Mayo on 08-20-15 so can you please enter that info in the comment section of the referral when placed so We will know when we get the referral who the patient is seeing and when. Thank you

## 2015-08-08 NOTE — Telephone Encounter (Signed)
Referral entered with comments

## 2015-08-20 ENCOUNTER — Ambulatory Visit: Payer: 59 | Admitting: Vascular Surgery

## 2015-08-20 ENCOUNTER — Encounter (HOSPITAL_COMMUNITY): Payer: 59

## 2015-09-09 ENCOUNTER — Telehealth: Payer: Self-pay

## 2015-09-09 DIAGNOSIS — E781 Pure hyperglyceridemia: Secondary | ICD-10-CM

## 2015-09-09 DIAGNOSIS — E119 Type 2 diabetes mellitus without complications: Secondary | ICD-10-CM

## 2015-09-09 NOTE — Telephone Encounter (Signed)
Fasting lab slips in your inbox.

## 2015-09-09 NOTE — Telephone Encounter (Signed)
Pt is due for a f/u visit but wants to get lab work done 1st before making appointment.

## 2015-09-10 NOTE — Telephone Encounter (Signed)
Pt.notified

## 2015-09-17 LAB — HEMOGLOBIN A1C
Hgb A1c MFr Bld: 6.5 % — ABNORMAL HIGH (ref <5.7)
Mean Plasma Glucose: 140 mg/dL — ABNORMAL HIGH (ref <117)

## 2015-09-18 LAB — LIPID PANEL
CHOL/HDL RATIO: 3.6 ratio (ref <=5.0)
Cholesterol: 105 mg/dL — ABNORMAL LOW (ref 125–200)
HDL: 29 mg/dL — AB (ref >=40)
LDL CALC: 20 mg/dL (ref <130)
Triglycerides: 281 mg/dL — ABNORMAL HIGH (ref <150)
VLDL: 56 mg/dL — AB (ref <30)

## 2015-10-10 ENCOUNTER — Other Ambulatory Visit: Payer: Self-pay

## 2015-10-10 MED ORDER — METFORMIN HCL 1000 MG PO TABS: 1000.0000 mg | ORAL_TABLET | Freq: Two times a day (BID) | ORAL | Status: DC

## 2015-10-21 ENCOUNTER — Ambulatory Visit: Payer: 59 | Admitting: Family Medicine

## 2015-10-30 ENCOUNTER — Other Ambulatory Visit: Payer: Self-pay | Admitting: Family Medicine

## 2015-10-30 ENCOUNTER — Ambulatory Visit: Payer: 59 | Admitting: Family Medicine

## 2015-10-31 NOTE — Telephone Encounter (Signed)
Please advise if refill is appropriate. Thanks.

## 2015-11-05 ENCOUNTER — Encounter: Payer: Self-pay | Admitting: Family Medicine

## 2015-11-05 ENCOUNTER — Ambulatory Visit (INDEPENDENT_AMBULATORY_CARE_PROVIDER_SITE_OTHER): Payer: BLUE CROSS/BLUE SHIELD | Admitting: Family Medicine

## 2015-11-05 VITALS — BP 153/94 | HR 82 | Wt 271.0 lb

## 2015-11-05 DIAGNOSIS — Z0000 Encounter for general adult medical examination without abnormal findings: Secondary | ICD-10-CM | POA: Diagnosis not present

## 2015-11-05 LAB — CBC
HEMATOCRIT: 41.3 % (ref 39.0–52.0)
Hemoglobin: 13.9 g/dL (ref 13.0–17.0)
MCH: 28.4 pg (ref 26.0–34.0)
MCHC: 33.7 g/dL (ref 30.0–36.0)
MCV: 84.3 fL (ref 78.0–100.0)
MPV: 11.1 fL (ref 8.6–12.4)
Platelets: 187 10*3/uL (ref 150–400)
RBC: 4.9 MIL/uL (ref 4.22–5.81)
RDW: 14.1 % (ref 11.5–15.5)
WBC: 5.8 10*3/uL (ref 4.0–10.5)

## 2015-11-05 LAB — COMPLETE METABOLIC PANEL WITH GFR
ALT: 47 U/L — ABNORMAL HIGH (ref 9–46)
AST: 30 U/L (ref 10–35)
Albumin: 4.4 g/dL (ref 3.6–5.1)
Alkaline Phosphatase: 52 U/L (ref 40–115)
BUN: 11 mg/dL (ref 7–25)
CALCIUM: 9.7 mg/dL (ref 8.6–10.3)
CHLORIDE: 102 mmol/L (ref 98–110)
CO2: 24 mmol/L (ref 20–31)
Creat: 0.84 mg/dL (ref 0.70–1.25)
GFR, Est Non African American: >89 mL/min (ref >=60)
Glucose, Bld: 108 mg/dL — ABNORMAL HIGH (ref 65–99)
POTASSIUM: 4.9 mmol/L (ref 3.5–5.3)
Sodium: 136 mmol/L (ref 135–146)
Total Bilirubin: 0.4 mg/dL (ref 0.2–1.2)
Total Protein: 6.9 g/dL (ref 6.1–8.1)

## 2015-11-05 LAB — LIPID PANEL
Cholesterol: 117 mg/dL — ABNORMAL LOW (ref 125–200)
HDL: 33 mg/dL — ABNORMAL LOW (ref >=40)
LDL CALC: 42 mg/dL (ref <130)
TRIGLYCERIDES: 209 mg/dL — AB (ref <150)
Total CHOL/HDL Ratio: 3.5 Ratio (ref <=5.0)
VLDL: 42 mg/dL — ABNORMAL HIGH (ref <30)

## 2015-11-05 MED ORDER — LISINOPRIL 40 MG PO TABS: 40.0000 mg | ORAL_TABLET | Freq: Every day | ORAL | Status: DC

## 2015-11-05 MED ORDER — METOPROLOL SUCCINATE ER 50 MG PO TB24: 50.0000 mg | ORAL_TABLET | Freq: Every day | ORAL | Status: DC

## 2015-11-05 MED ORDER — COLESTIPOL HCL 1 G PO TABS: 4.0000 g | ORAL_TABLET | Freq: Two times a day (BID) | ORAL | Status: DC

## 2015-11-05 MED ORDER — CLOPIDOGREL BISULFATE 75 MG PO TABS: 75.0000 mg | ORAL_TABLET | Freq: Every day | ORAL | Status: DC

## 2015-11-05 MED ORDER — FINASTERIDE 5 MG PO TABS: 5.0000 mg | ORAL_TABLET | Freq: Every day | ORAL | Status: DC

## 2015-11-05 MED ORDER — BUSPIRONE HCL 10 MG PO TABS: 10.0000 mg | ORAL_TABLET | Freq: Three times a day (TID) | ORAL | Status: DC

## 2015-11-05 MED ORDER — METFORMIN HCL 1000 MG PO TABS: 1000.0000 mg | ORAL_TABLET | Freq: Two times a day (BID) | ORAL | Status: DC

## 2015-11-05 MED ORDER — PANTOPRAZOLE SODIUM 40 MG PO TBEC: 40.0000 mg | DELAYED_RELEASE_TABLET | Freq: Every day | ORAL | Status: DC

## 2015-11-05 MED ORDER — ATORVASTATIN CALCIUM 80 MG PO TABS: 80.0000 mg | ORAL_TABLET | Freq: Every day | ORAL | Status: DC

## 2015-11-05 NOTE — Progress Notes (Signed)
CC: David Singh is a 65 y.o. male is here for Annual Exam and Medication Management   Subjective: HPI:  Colonoscopy: up-to-date and due in 5 years Prostate: Discussed screening risks/beneifts with patient during today's visit, we'll obtain PSA.  Influenza Vaccine: UTD from September Pneumovax: no current indication Td/Tdap: Overdue, he plans on getting this at [facility]. Zoster: UTD from this year  Requesting complete physical exam with no acute complaints. Requesting refills on all medications given recent switching pharmacies.   Review of Systems - General ROS: negative for - chills, fever, night sweats, weight gain or weight loss Ophthalmic ROS: negative for - decreased vision Psychological ROS: negative for - anxiety or depression ENT ROS: negative for - hearing change, nasal congestion, tinnitus or allergies Hematological and Lymphatic ROS: negative for - bleeding problems, bruising or swollen lymph nodes Breast ROS: negative Respiratory ROS: no cough, shortness of breath, or wheezing Cardiovascular ROS: no chest pain or dyspnea on exertion Gastrointestinal ROS: no abdominal pain, change in bowel habits, or black or bloody stools Genito-Urinary ROS: negative for - genital discharge, genital ulcers, incontinence or abnormal bleeding from genitals Musculoskeletal ROS: negative for - joint pain or muscle pain Neurological ROS: negative for - headaches or memory loss Dermatological ROS: negative for lumps, mole changes, rash and skin lesion changes  Past Medical History  Diagnosis Date  . Diabetes mellitus without complication   . Hypertension   . Hyperlipidemia   . GERD (gastroesophageal reflux disease)   . Peripheral vascular disease     Past Surgical History  Procedure Laterality Date  . Tonsillectomy    . Meniscus repair Right   . Nasal sinus surgery    . Cardiac surgery     Family History  Problem Relation Age of Onset  . Diabetes Mother   . Hypertension Mother    . Hyperlipidemia Mother   . Heart attack Mother   . Alcohol abuse Brother   . Cancer Brother   . Diabetes Brother   . Hyperlipidemia Brother   . Hypertension Brother   . Hyperlipidemia Father   . Hypertension Father     Social History   Social History  . Marital Status: Married    Spouse Name: N/A  . Number of Children: N/A  . Years of Education: N/A   Occupational History  . Not on file.   Social History Main Topics  . Smoking status: Former Research scientist (life sciences)  . Smokeless tobacco: Not on file  . Alcohol Use: 0.0 oz/week    0 Standard drinks or equivalent per week     Comment: 2-8 times per year  . Drug Use: No  . Sexual Activity: Not on file   Other Topics Concern  . Not on file   Social History Narrative     Objective: BP 153/94 mmHg  Pulse 82  Wt 271 lb (122.925 kg)  General: No Acute Distress HEENT: Atraumatic, normocephalic, conjunctivae normal without scleral icterus.  No nasal discharge, hearing grossly intact, TMs with good landmarks bilaterally with no middle ear abnormalities, posterior pharynx clear without oral lesions. Neck: Supple, trachea midline, no cervical nor supraclavicular adenopathy. Pulmonary: Clear to auscultation bilaterally without wheezing, rhonchi, nor rales. Cardiac: Regular rate and rhythm.  No murmurs, rubs, nor gallops. No peripheral edema.  2+ peripheral pulses bilaterally. Abdomen: Bowel sounds normal.  No masses.  Non-tender without rebound.  Negative Murphy's sign. MSK: Grossly intact, no signs of weakness.  Full strength throughout upper and lower extremities.  Full ROM in upper and  lower extremities.  No midline spinal tenderness. Neuro: Gait unremarkable, CN II-XII grossly intact.  C5-C6 Reflex 2/4 Bilaterally, L4 Reflex 2/4 Bilaterally.  Cerebellar function intact. Skin: No rashes. Psych: Alert and oriented to person/place/time.  Thought process normal. No anxiety/depression.  Assessment & Plan: Joandri was seen today for annual exam  and medication management.  Diagnoses and all orders for this visit:  Annual physical exam -     COMPLETE METABOLIC PANEL WITH GFR -     CBC -     Lipid panel -     PSA  Other orders -     atorvastatin (LIPITOR) 80 MG tablet; Take 1 tablet (80 mg total) by mouth daily. -     busPIRone (BUSPAR) 10 MG tablet; Take 1 tablet (10 mg total) by mouth 3 (three) times daily. -     colestipol (COLESTID) 1 g tablet; Take 4 tablets (4 g total) by mouth 2 (two) times daily. -     lisinopril (PRINIVIL,ZESTRIL) 40 MG tablet; Take 1 tablet (40 mg total) by mouth daily. -     metFORMIN (GLUCOPHAGE) 1000 MG tablet; Take 1 tablet (1,000 mg total) by mouth 2 (two) times daily with a meal. -     metoprolol succinate (TOPROL-XL) 50 MG 24 hr tablet; Take 1 tablet (50 mg total) by mouth daily. Take with or immediately following a meal. -     clopidogrel (PLAVIX) 75 MG tablet; Take 1 tablet (75 mg total) by mouth daily. -     pantoprazole (PROTONIX) 40 MG tablet; Take 1 tablet (40 mg total) by mouth daily. -     finasteride (PROSCAR) 5 MG tablet; Take 1 tablet (5 mg total) by mouth daily.   Healthy lifestyle interventions including but not limited to regular exercise, a healthy low fat diet, moderation of salt intake, the dangers of tobacco/alcohol/recreational drug use, nutrition supplementation, and accident avoidance were discussed with the patient and a handout was provided for future reference.  Return in about 3 months (around 02/03/2016) for Blood Sugar.

## 2015-11-06 ENCOUNTER — Telehealth: Payer: Self-pay

## 2015-11-06 LAB — PSA: PSA: 1.08 ng/mL (ref <=4.00)

## 2015-11-06 NOTE — Telephone Encounter (Signed)
Pt stated that Colestid is too costly and would like is there something cheaper he could take? And he also wants to make sure that he is not to take amlodipine?

## 2015-11-07 MED ORDER — AMLODIPINE BESYLATE 10 MG PO TABS: 10.0000 mg | ORAL_TABLET | Freq: Every day | ORAL | Status: DC

## 2015-11-07 MED ORDER — NIACIN ER 500 MG PO CPCR
500.0000 mg | ORAL_CAPSULE | Freq: Every day | ORAL | Status: DC
Start: 2015-11-07 — End: 2016-03-31

## 2015-11-07 NOTE — Telephone Encounter (Signed)
Wife notified.

## 2015-11-07 NOTE — Telephone Encounter (Signed)
A new medication called niacin will be sent to his [facility] to replace colestid.  Looking back it looks like his amlodipine Rx expired in our electronic medical records, I would actually still recommend he take it so I'll send in refills for the next year.  Sorry about the accidental expiration.

## 2016-03-09 ENCOUNTER — Ambulatory Visit (INDEPENDENT_AMBULATORY_CARE_PROVIDER_SITE_OTHER): Payer: BLUE CROSS/BLUE SHIELD

## 2016-03-09 ENCOUNTER — Encounter: Payer: Self-pay | Admitting: Family Medicine

## 2016-03-09 ENCOUNTER — Ambulatory Visit (INDEPENDENT_AMBULATORY_CARE_PROVIDER_SITE_OTHER): Payer: BLUE CROSS/BLUE SHIELD | Admitting: Family Medicine

## 2016-03-09 VITALS — BP 148/93 | HR 93 | Wt 278.0 lb

## 2016-03-09 DIAGNOSIS — M1000 Idiopathic gout, unspecified site: Secondary | ICD-10-CM | POA: Diagnosis not present

## 2016-03-09 DIAGNOSIS — M25474 Effusion, right foot: Secondary | ICD-10-CM | POA: Diagnosis not present

## 2016-03-09 DIAGNOSIS — M1A9XX0 Chronic gout, unspecified, without tophus (tophi): Secondary | ICD-10-CM | POA: Insufficient documentation

## 2016-03-09 DIAGNOSIS — M109 Gout, unspecified: Secondary | ICD-10-CM

## 2016-03-09 MED ORDER — COLCHICINE 0.6 MG PO TABS: 0.6000 mg | ORAL_TABLET | Freq: Every day | ORAL | Status: DC

## 2016-03-09 MED ORDER — COLCHICINE 0.6 MG PO CAPS: 1.0000 | ORAL_CAPSULE | Freq: Every day | ORAL | Status: DC

## 2016-03-09 MED ORDER — HYDROCODONE-ACETAMINOPHEN 5-325 MG PO TABS: 1.0000 | ORAL_TABLET | Freq: Four times a day (QID) | ORAL | Status: DC | PRN

## 2016-03-09 NOTE — Patient Instructions (Signed)
Thank you for coming in today. Take either the colchicine capsules or tablets based on cost.  Use norco for severe pain.  Use the rigid sole shoe as needed.  Return in 1 week if not better.  Return sooner if needed.  Get labs today as well.   Gout Gout is an inflammatory arthritis caused by a buildup of uric acid crystals in the joints. Uric acid is a chemical that is normally present in the blood. When the level of uric acid in the blood is too high it can form crystals that deposit in your joints and tissues. This causes joint redness, soreness, and swelling (inflammation). Repeat attacks are common. Over time, uric acid crystals can form into masses (tophi) near a joint, destroying bone and causing disfigurement. Gout is treatable and often preventable. CAUSES  The disease begins with elevated levels of uric acid in the blood. Uric acid is produced by your body when it breaks down a naturally found substance called purines. Certain foods you eat, such as meats and fish, contain high amounts of purines. Causes of an elevated uric acid level include:  Being passed down from parent to child (heredity).  Diseases that cause increased uric acid production (such as obesity, psoriasis, and certain cancers).  Excessive alcohol use.  Diet, especially diets rich in meat and seafood.  Medicines, including certain cancer-fighting medicines (chemotherapy), water pills (diuretics), and aspirin.  Chronic kidney disease. The kidneys are no longer able to remove uric acid well.  Problems with metabolism. Conditions strongly associated with gout include:  Obesity.  High blood pressure.  High cholesterol.  Diabetes. Not everyone with elevated uric acid levels gets gout. It is not understood why some people get gout and others do not. Surgery, joint injury, and eating too much of certain foods are some of the factors that can lead to gout attacks. SYMPTOMS   An attack of gout comes on quickly.  It causes intense pain with redness, swelling, and warmth in a joint.  Fever can occur.  Often, only one joint is involved. Certain joints are more commonly involved:  Base of the big toe.  Knee.  Ankle.  Wrist.  Finger. Without treatment, an attack usually goes away in a few days to weeks. Between attacks, you usually will not have symptoms, which is different from many other forms of arthritis. DIAGNOSIS  Your caregiver will suspect gout based on your symptoms and exam. In some cases, tests may be recommended. The tests may include:  Blood tests.  Urine tests.  X-rays.  Joint fluid exam. This exam requires a needle to remove fluid from the joint (arthrocentesis). Using a microscope, gout is confirmed when uric acid crystals are seen in the joint fluid. TREATMENT  There are two phases to gout treatment: treating the sudden onset (acute) attack and preventing attacks (prophylaxis).  Treatment of an Acute Attack.  Medicines are used. These include anti-inflammatory medicines or steroid medicines.  An injection of steroid medicine into the affected joint is sometimes necessary.  The painful joint is rested. Movement can worsen the arthritis.  You may use warm or cold treatments on painful joints, depending which works best for you.  Treatment to Prevent Attacks.  If you suffer from frequent gout attacks, your caregiver may advise preventive medicine. These medicines are started after the acute attack subsides. These medicines either help your kidneys eliminate uric acid from your body or decrease your uric acid production. You may need to stay on these medicines for  a very long time.  The early phase of treatment with preventive medicine can be associated with an increase in acute gout attacks. For this reason, during the first few months of treatment, your caregiver may also advise you to take medicines usually used for acute gout treatment. Be sure you understand your  caregiver's directions. Your caregiver may make several adjustments to your medicine dose before these medicines are effective.  Discuss dietary treatment with your caregiver or dietitian. Alcohol and drinks high in sugar and fructose and foods such as meat, poultry, and seafood can increase uric acid levels. Your caregiver or dietitian can advise you on drinks and foods that should be limited. HOME CARE INSTRUCTIONS   Do not take aspirin to relieve pain. This raises uric acid levels.  Only take over-the-counter or prescription medicines for pain, discomfort, or fever as directed by your caregiver.  Rest the joint as much as possible. When in bed, keep sheets and blankets off painful areas.  Keep the affected joint raised (elevated).  Apply warm or cold treatments to painful joints. Use of warm or cold treatments depends on which works best for you.  Use crutches if the painful joint is in your leg.  Drink enough fluids to keep your urine clear or pale yellow. This helps your body get rid of uric acid. Limit alcohol, sugary drinks, and fructose drinks.  Follow your dietary instructions. Pay careful attention to the amount of protein you eat. Your daily diet should emphasize fruits, vegetables, whole grains, and fat-free or low-fat milk products. Discuss the use of coffee, vitamin C, and cherries with your caregiver or dietitian. These may be helpful in lowering uric acid levels.  Maintain a healthy body weight. SEEK MEDICAL CARE IF:   You develop diarrhea, vomiting, or any side effects from medicines.  You do not feel better in 24 hours, or you are getting worse. SEEK IMMEDIATE MEDICAL CARE IF:   Your joint becomes suddenly more tender, and you have chills or a fever. MAKE SURE YOU:   Understand these instructions.  Will watch your condition.  Will get help right away if you are not doing well or get worse.   This information is not intended to replace advice given to you by  your health care provider. Make sure you discuss any questions you have with your health care provider.   Document Released: 10/08/2000 Document Revised: 11/01/2014 Document Reviewed: 05/24/2012 Elsevier Interactive Patient Education [employer].

## 2016-03-09 NOTE — Progress Notes (Signed)
Quick Note:  No fractures seen on xray ______

## 2016-03-09 NOTE — Progress Notes (Signed)
   Subjective:    I'm seeing this patient as a consultation for:  Dr. Ileene Rubens  CC: Right great toe pain  HPI: Patient notes right great toe and foot pain over the last several days worsening recently. He notes the pain was occurring without any specific injury or inciting event. However the pain did worsen when his 64-year-old grandson fell on his foot. He notes severe pain with difficulty ambulating. His tried some over-the-counter medicines which have not helped. He notes he's never been diagnosed with gout. No fevers chills nausea vomiting or diarrhea. He denies any tick bites.  Past medical history, Surgical history, Family history not pertinant except as noted below, Social history, Allergies, and medications have been entered into the medical record, reviewed, and no changes needed.   Review of Systems: No headache, visual changes, nausea, vomiting, diarrhea, constipation, dizziness, abdominal pain, skin rash, fevers, chills, night sweats, weight loss, swollen lymph nodes, body aches, joint swelling, muscle aches, chest pain, shortness of breath, mood changes, visual or auditory hallucinations.   Objective:    Filed Vitals:   03/09/16 1353  BP: 148/93  Pulse: 93   General: Well Developed, well nourished, and in no acute distress.  Neuro/Psych: Alert and oriented x3, extra-ocular muscles intact, able to move all 4 extremities, sensation grossly intact. Skin: Warm and dry, no rashes noted.  Respiratory: Not using accessory muscles, speaking in full sentences, trachea midline.  Cardiovascular: Pulses palpable, no extremity edema. Abdomen: Does not appear distended. MSK: Right foot is erythematous and swollen especially at the first MTP. Tender palpation at the first MTP. Pulses capillary refill and sensation are intact. Antalgic gait is present.    No results found for this or any previous visit (from the past 24 hour(s)). Dg Foot Complete Right  03/09/2016  CLINICAL DATA:  Right  great toe pain and erythema for the past day ; direct trauma to the foot when the patient's grandson jumped on it last night. EXAM: RIGHT FOOT COMPLETE - 3+ VIEW COMPARISON:  None in PACs FINDINGS: The bones are adequately mineralized. Specific attention to the great toe reveals no acute fracture nor dislocation. The joint spaces are preserved. There is mild soft tissue swelling over the medial aspect of the first MTP joint. There is degenerative change of the second metatarsophalangeal joint with mild joint space loss. The other MTP joints are normal. The interphalangeal joints are preserved. No phalangeal fracture is observed. The metatarsals and tarsals are intact. There is a plantar calcaneal spur. IMPRESSION: There is no acute bony abnormality of the right great toe. Mild soft tissue swelling over the medial aspect of the MTP joint is nonspecific. There is degenerative change of the second MTP joint. Electronically Signed   By: David  Martinique M.D.   On: 03/09/2016 15:19    Impression and Recommendations:   65 year old male with right foot pain likely gout related. Treat with colchicine, Norco, and rigid soled postoperative shoe. CBC and CMP and uric acid pending. Follow-up with PCP soon.  This case required medical decision making of moderate complexity.

## 2016-03-10 ENCOUNTER — Encounter: Payer: Self-pay | Admitting: Family Medicine

## 2016-03-10 LAB — COMPREHENSIVE METABOLIC PANEL
ALT: 32 U/L (ref 9–46)
AST: 25 U/L (ref 10–35)
Albumin: 4.4 g/dL (ref 3.6–5.1)
Alkaline Phosphatase: 48 U/L (ref 40–115)
BILIRUBIN TOTAL: 0.5 mg/dL (ref 0.2–1.2)
BUN: 16 mg/dL (ref 7–25)
CHLORIDE: 103 mmol/L (ref 98–110)
CO2: 20 mmol/L (ref 20–31)
CREATININE: 1.01 mg/dL (ref 0.70–1.25)
Calcium: 9.6 mg/dL (ref 8.6–10.3)
GLUCOSE: 90 mg/dL (ref 65–99)
Potassium: 4.1 mmol/L (ref 3.5–5.3)
SODIUM: 137 mmol/L (ref 135–146)
Total Protein: 7 g/dL (ref 6.1–8.1)

## 2016-03-10 LAB — CBC
HCT: 40.7 % (ref 38.5–50.0)
Hemoglobin: 13.6 g/dL (ref 13.2–17.1)
MCH: 28.5 pg (ref 27.0–33.0)
MCHC: 33.4 g/dL (ref 32.0–36.0)
MCV: 85.1 fL (ref 80.0–100.0)
MPV: 11.4 fL (ref 7.5–12.5)
PLATELETS: 181 10*3/uL (ref 140–400)
RBC: 4.78 MIL/uL (ref 4.20–5.80)
RDW: 14.8 % (ref 11.0–15.0)
WBC: 8.2 10*3/uL (ref 3.8–10.8)

## 2016-03-10 LAB — URIC ACID: Uric Acid, Serum: 8.8 mg/dL — ABNORMAL HIGH (ref 4.0–7.8)

## 2016-03-10 NOTE — Progress Notes (Signed)
Quick Note:  Labs show significant gout. Take the colchicine if possible and follow-up with your primary care doctor soon to manage your gout levels. ______

## 2016-03-18 ENCOUNTER — Other Ambulatory Visit: Payer: Self-pay | Admitting: Family Medicine

## 2016-03-31 ENCOUNTER — Ambulatory Visit (INDEPENDENT_AMBULATORY_CARE_PROVIDER_SITE_OTHER): Payer: BLUE CROSS/BLUE SHIELD | Admitting: Family Medicine

## 2016-03-31 ENCOUNTER — Encounter: Payer: Self-pay | Admitting: Family Medicine

## 2016-03-31 VITALS — BP 157/94 | HR 80 | Wt 276.0 lb

## 2016-03-31 DIAGNOSIS — E119 Type 2 diabetes mellitus without complications: Secondary | ICD-10-CM

## 2016-03-31 DIAGNOSIS — I2510 Atherosclerotic heart disease of native coronary artery without angina pectoris: Secondary | ICD-10-CM | POA: Diagnosis not present

## 2016-03-31 DIAGNOSIS — E781 Pure hyperglyceridemia: Secondary | ICD-10-CM | POA: Diagnosis not present

## 2016-03-31 DIAGNOSIS — I10 Essential (primary) hypertension: Secondary | ICD-10-CM

## 2016-03-31 DIAGNOSIS — I739 Peripheral vascular disease, unspecified: Secondary | ICD-10-CM

## 2016-03-31 LAB — LIPID PANEL
CHOL/HDL RATIO: 4.2 ratio (ref <=5.0)
CHOLESTEROL: 157 mg/dL (ref 125–200)
HDL: 37 mg/dL — ABNORMAL LOW (ref >=40)
LDL CALC: 71 mg/dL (ref <130)
TRIGLYCERIDES: 244 mg/dL — AB (ref <150)
VLDL: 49 mg/dL — AB (ref <30)

## 2016-03-31 LAB — POCT GLYCOSYLATED HEMOGLOBIN (HGB A1C): Hemoglobin A1C: 6.9

## 2016-03-31 MED ORDER — NIACIN ER 500 MG PO CPCR: 500.0000 mg | ORAL_CAPSULE | Freq: Every day | ORAL | Status: DC

## 2016-03-31 MED ORDER — AMLODIPINE BESYLATE 10 MG PO TABS: 10.0000 mg | ORAL_TABLET | Freq: Every day | ORAL | Status: DC

## 2016-03-31 MED ORDER — ATORVASTATIN CALCIUM 80 MG PO TABS: 80.0000 mg | ORAL_TABLET | Freq: Every day | ORAL | Status: DC

## 2016-03-31 MED ORDER — METFORMIN HCL 1000 MG PO TABS: ORAL_TABLET | ORAL | Status: DC

## 2016-03-31 MED ORDER — METOPROLOL SUCCINATE ER 100 MG PO TB24: 100.0000 mg | ORAL_TABLET | Freq: Every day | ORAL | Status: DC

## 2016-03-31 MED ORDER — LISINOPRIL 40 MG PO TABS: 40.0000 mg | ORAL_TABLET | Freq: Every day | ORAL | Status: DC

## 2016-03-31 MED ORDER — BUSPIRONE HCL 10 MG PO TABS: 10.0000 mg | ORAL_TABLET | Freq: Three times a day (TID) | ORAL | Status: DC

## 2016-03-31 NOTE — Progress Notes (Signed)
CC: David Singh is a 65 y.o. male is here for Hypertension and Hyperglycemia   Subjective: HPI:  Follow-up type 2 diabetes: Taking metformin 100% compliance, no outside blood sugars to report. No polyuria or polyphagia or polydipsia.  Follow hypertension: Taking metoprolol, amlodipine and lisinopril. No known side effects. He tells me he "feels like his blood pressure is elevated". He has difficulty elaborating on what symptoms are influencing this sensation however he denies any chest pain shortness of breath or peripheral edema. He still has some right leg claudication when walking long distances however those away after a minute or 2 of resting. He is interested in getting a stent placed in his femoral artery however he cannot afford it right now with his high deductible insurance plan and is hoping to wait until he gets Medicare in February.  Follow-up hypertriglyceridemia: He is able to afford niacin has been taking it daily without any known side effects. No right upper quadrant pain or epigastric pain.   Review Of Systems Outlined In HPI  Past Medical History  Diagnosis Date  . Diabetes mellitus without complication ([place])   . Hypertension   . Hyperlipidemia   . GERD (gastroesophageal reflux disease)   . Peripheral vascular disease [facility])     Past Surgical History  Procedure Laterality Date  . Tonsillectomy    . Meniscus repair Right   . Nasal sinus surgery    . Cardiac surgery     Family History  Problem Relation Age of Onset  . Diabetes Mother   . Hypertension Mother   . Hyperlipidemia Mother   . Heart attack Mother   . Alcohol abuse Brother   . Cancer Brother   . Diabetes Brother   . Hyperlipidemia Brother   . Hypertension Brother   . Hyperlipidemia Father   . Hypertension Father     Social History   Social History  . Marital Status: Married    Spouse Name: N/A  . Number of Children: N/A  . Years of Education: N/A   Occupational History  . Not on file.    Social History Main Topics  . Smoking status: Former Research scientist (life sciences)  . Smokeless tobacco: Not on file  . Alcohol Use: 0.0 oz/week    0 Standard drinks or equivalent per week     Comment: 2-8 times per year  . Drug Use: No  . Sexual Activity: Not on file   Other Topics Concern  . Not on file   Social History Narrative     Objective: BP 157/94 mmHg  Pulse 80  Wt 276 lb (125.193 kg)  General: Alert and Oriented, No Acute Distress HEENT: Pupils equal, round, reactive to light. Conjunctivae clear.  Moist mucous membranes Lungs: Clear to auscultation bilaterally, no wheezing/ronchi/rales.  Comfortable work of breathing. Good air movement. Cardiac: Regular rate and rhythm. Normal S1/S2.  No murmurs, rubs, nor gallops.   Abdomen: Normal bowel sounds, soft and non tender without palpable masses. Extremities: No peripheral edema.  Strong peripheral pulses.  Mental Status: No depression, anxiety, nor agitation. Skin: Warm and dry.  Assessment & Plan: Nabeel was seen today for hypertension and hyperglycemia.  Diagnoses and all orders for this visit:  Type 2 diabetes mellitus without complication, without long-term current use of insulin (HCC) -     atorvastatin (LIPITOR) 80 MG tablet; Take 1 tablet (80 mg total) by mouth daily. -     metFORMIN (GLUCOPHAGE) 1000 MG tablet; TAKE ONE TABLET BY MOUTH TWICE DAILY WITH MEALS -  POCT HgB A1C  Essential hypertension -     amLODipine (NORVASC) 10 MG tablet; Take 1 tablet (10 mg total) by mouth daily. -     lisinopril (PRINIVIL,ZESTRIL) 40 MG tablet; Take 1 tablet (40 mg total) by mouth daily. -     metoprolol succinate (TOPROL-XL) 100 MG 24 hr tablet; Take 1 tablet (100 mg total) by mouth daily. Take with or immediately following a meal.  CAD in native artery  Hypertriglyceridemia -     niacin 500 MG CR capsule; Take 1 capsule (500 mg total) by mouth at bedtime. -     Lipid panel  PAD (peripheral artery disease) (HCC)  Other orders -      busPIRone (BUSPAR) 10 MG tablet; Take 1 tablet (10 mg total) by mouth 3 (three) times daily.   Type 2 diabetes: A1c of 6.9, controlled continue metformin twice a day Essential hypertension: Uncontrolled chronic condition continue current dose of lisinopril and amlodipine, increasing metoprolol Peripheral artery disease: Symptomatically but stable, discussed importance of staying on aspirin and Plavix and encouraging a walking regimen daily as tolerated for pain and subtherapeutic for his planned to wait until he gets Medicare before stenting Hypertriglyceridemia: Continue niacin pending lipid panel today,   Return in about 3 months (around 07/01/2016) for sugar.

## 2016-03-31 NOTE — Addendum Note (Signed)
Addended by: Delrae Alfred on: 03/31/2016 11:06 AM   Modules accepted: Miquel Dunn

## 2016-04-05 ENCOUNTER — Telehealth: Payer: Self-pay

## 2016-04-05 ENCOUNTER — Other Ambulatory Visit: Payer: Self-pay

## 2016-04-05 DIAGNOSIS — E781 Pure hyperglyceridemia: Secondary | ICD-10-CM

## 2016-04-05 MED ORDER — FENOFIBRATE 160 MG PO TABS: 160.0000 mg | ORAL_TABLET | Freq: Every day | ORAL | Status: DC

## 2016-04-05 NOTE — Telephone Encounter (Signed)
Starting fenofibrate 160, recheck lipids in 3 months.Fasting.

## 2016-04-05 NOTE — Assessment & Plan Note (Signed)
Starting fenofibrate 160, recheck lipid panel in 3 months.

## 2016-04-05 NOTE — Telephone Encounter (Signed)
Opened in error

## 2016-04-05 NOTE — Telephone Encounter (Signed)
Pt notified of results and is ok with starting fenofibrate

## 2016-05-07 ENCOUNTER — Other Ambulatory Visit: Payer: Self-pay | Admitting: Family Medicine

## 2016-06-02 ENCOUNTER — Other Ambulatory Visit: Payer: Self-pay | Admitting: Family Medicine

## 2016-07-20 ENCOUNTER — Other Ambulatory Visit: Payer: Self-pay | Admitting: Family Medicine

## 2016-07-20 DIAGNOSIS — E781 Pure hyperglyceridemia: Secondary | ICD-10-CM

## 2016-08-13 ENCOUNTER — Other Ambulatory Visit: Payer: Self-pay | Admitting: Sports Medicine

## 2016-08-13 DIAGNOSIS — E781 Pure hyperglyceridemia: Secondary | ICD-10-CM

## 2016-08-17 ENCOUNTER — Telehealth: Payer: Self-pay | Admitting: Family Medicine

## 2016-08-17 NOTE — Telephone Encounter (Signed)
That is fine 

## 2016-08-17 NOTE — Telephone Encounter (Signed)
Patient wishes to transfer care from [facility] (Dr. Marcial Pacas) to Dr. Lorelei Pont. Dr. Ileene Rubens has left the [place] office.

## 2016-08-18 ENCOUNTER — Emergency Department (HOSPITAL_BASED_OUTPATIENT_CLINIC_OR_DEPARTMENT_OTHER): Payer: BLUE CROSS/BLUE SHIELD

## 2016-08-18 ENCOUNTER — Encounter (HOSPITAL_BASED_OUTPATIENT_CLINIC_OR_DEPARTMENT_OTHER): Payer: Self-pay | Admitting: *Deleted

## 2016-08-18 ENCOUNTER — Telehealth: Payer: Self-pay | Admitting: Family Medicine

## 2016-08-18 ENCOUNTER — Emergency Department (HOSPITAL_BASED_OUTPATIENT_CLINIC_OR_DEPARTMENT_OTHER)
Admission: EM | Admit: 2016-08-18 | Discharge: 2016-08-18 | Disposition: A | Discharge status: Home / Self Care | Payer: BLUE CROSS/BLUE SHIELD | Attending: Emergency Medicine | Admitting: Emergency Medicine

## 2016-08-18 DIAGNOSIS — Z7984 Long term (current) use of oral hypoglycemic drugs: Secondary | ICD-10-CM | POA: Insufficient documentation

## 2016-08-18 DIAGNOSIS — Z87891 Personal history of nicotine dependence: Secondary | ICD-10-CM | POA: Diagnosis not present

## 2016-08-18 DIAGNOSIS — Z79899 Other long term (current) drug therapy: Secondary | ICD-10-CM | POA: Insufficient documentation

## 2016-08-18 DIAGNOSIS — E119 Type 2 diabetes mellitus without complications: Secondary | ICD-10-CM | POA: Diagnosis not present

## 2016-08-18 DIAGNOSIS — Z7982 Long term (current) use of aspirin: Secondary | ICD-10-CM | POA: Insufficient documentation

## 2016-08-18 DIAGNOSIS — R0789 Other chest pain: Secondary | ICD-10-CM

## 2016-08-18 DIAGNOSIS — R079 Chest pain, unspecified: Secondary | ICD-10-CM

## 2016-08-18 DIAGNOSIS — I10 Essential (primary) hypertension: Secondary | ICD-10-CM | POA: Diagnosis not present

## 2016-08-18 DIAGNOSIS — I2510 Atherosclerotic heart disease of native coronary artery without angina pectoris: Secondary | ICD-10-CM | POA: Diagnosis not present

## 2016-08-18 LAB — BASIC METABOLIC PANEL
Anion gap: 9 (ref 5–15)
BUN: 15 mg/dL (ref 6–20)
CHLORIDE: 103 mmol/L (ref 101–111)
CO2: 25 mmol/L (ref 22–32)
CREATININE: 0.9 mg/dL (ref 0.61–1.24)
Calcium: 9.6 mg/dL (ref 8.9–10.3)
GFR calc Af Amer: >60 mL/min (ref >60)
GFR calc non Af Amer: >60 mL/min (ref >60)
Glucose, Bld: 139 mg/dL — ABNORMAL HIGH (ref 65–99)
POTASSIUM: 4 mmol/L (ref 3.5–5.1)
Sodium: 137 mmol/L (ref 135–145)

## 2016-08-18 LAB — CBC
HEMATOCRIT: 39.7 % (ref 39.0–52.0)
Hemoglobin: 13.5 g/dL (ref 13.0–17.0)
MCH: 29.4 pg (ref 26.0–34.0)
MCHC: 34 g/dL (ref 30.0–36.0)
MCV: 86.5 fL (ref 78.0–100.0)
PLATELETS: 186 10*3/uL (ref 150–400)
RBC: 4.59 MIL/uL (ref 4.22–5.81)
RDW: 13.2 % (ref 11.5–15.5)
WBC: 5.7 10*3/uL (ref 4.0–10.5)

## 2016-08-18 LAB — CBG MONITORING, ED: Glucose-Capillary: 138 mg/dL — ABNORMAL HIGH (ref 65–99)

## 2016-08-18 LAB — TROPONIN I
Troponin I: <0.03 ng/mL (ref <0.03)
Troponin I: <0.03 ng/mL (ref <0.03)

## 2016-08-18 NOTE — ED Notes (Signed)
Patient transported to X-ray 

## 2016-08-18 NOTE — ED Provider Notes (Signed)
[place] DEPT [facility] Provider Note   CSN: [id number] Arrival date & time: 08/18/16  0940     History   Chief Complaint Chief Complaint  Patient presents with  . Chest Pain    HPI David Singh is a 65 y.o. male.  HPI   Ache in the chest started yesterday AM And when touch area of ribs and upper sternum has pain.  Aching radiating to shoulder blades and back, when lay back hurts, when sit up it's better. Today upper middle chest and when touch it hurts.  Now only hurts when pressing on it.   Not worse with exertion, no hx of exertional chest pain. 2006 had heavy chest and had stents placed. Pain today is different. No falls, no trauma, no cough, no fever, no dypsnea. On Saturday had high blood sugar and nausea.   [place] was where cath was, then 1 week went back     Past Medical History:  Diagnosis Date  . Diabetes mellitus without complication ([place])   . GERD (gastroesophageal reflux disease)   . Hyperlipidemia   . Hypertension   . Peripheral vascular disease [facility])     Patient Active Problem List   Diagnosis Date Noted  . Chronic gout 03/09/2016  . Hypertriglyceridemia 01/16/2015  . Type 2 diabetes mellitus ([place]) 01/06/2015  . CAD in native artery 01/06/2015  . History of coronary artery stent placement 01/06/2015  . PAD (peripheral artery disease) ([place]) 01/06/2015  . Hyperlipidemia 01/06/2015  . Essential hypertension 01/06/2015    Past Surgical History:  Procedure Laterality Date  . CARDIAC SURGERY    . CORONARY ANGIOPLASTY WITH STENT PLACEMENT    . MENISCUS REPAIR Right   . NASAL SINUS SURGERY    . TONSILLECTOMY         Home Medications    Prior to Admission medications   Medication Sig Start Date End Date Taking? Authorizing Provider  anastrozole (ARIMIDEX) 1 MG tablet Take 1 mg by mouth 3 (three) times a week.   Yes Historical Provider, MD  aspirin 81 MG tablet Take 81 mg by mouth daily.   Yes Historical Provider, MD  atorvastatin  (LIPITOR) 40 MG tablet Take 40 mg by mouth daily.   Yes Historical Provider, MD  busPIRone (BUSPAR) 10 MG tablet Take 1 tablet (10 mg total) by mouth 3 (three) times daily. Patient taking differently: Take 7.5 mg by mouth daily.  03/31/16  Yes Sean Hommel, DO  carvedilol (COREG) 25 MG tablet Take 25 mg by mouth 2 (two) times daily with a meal.   Yes Historical Provider, MD  clopidogrel (PLAVIX) 75 MG tablet Take 1 tablet (75 mg total) by mouth daily. 11/05/15  Yes Sean Hommel, DO  co-enzyme Q-10 30 MG capsule Take 30 mg by mouth 3 (three) times daily.   Yes Historical Provider, MD  Cyanocobalamin (VITAMIN B-12) 2500 MCG SUBL Place under the tongue.   Yes Historical Provider, MD  fenofibrate 160 MG tablet TAKE 1 TABLET(160 MG) BY MOUTH DAILY. NEED FOLLOW UP VISIT FOR MORE REFILLS 08/15/16  Yes Silverio Decamp, MD  lisinopril (PRINIVIL,ZESTRIL) 40 MG tablet Take 1 tablet (40 mg total) by mouth daily. 03/31/16  Yes Sean Hommel, DO  metFORMIN (GLUCOPHAGE) 1000 MG tablet TAKE ONE TABLET BY MOUTH TWICE DAILY WITH MEALS Patient taking differently: 500 mg 2 (two) times daily with a meal. TAKE ONE TABLET BY MOUTH TWICE DAILY WITH MEALS 03/31/16  Yes Sean Hommel, DO  ranitidine (ZANTAC) 300 MG capsule Take 300 mg  by mouth every evening.   Yes Historical Provider, MD  amLODipine (NORVASC) 10 MG tablet Take 1 tablet (10 mg total) by mouth daily. 03/31/16   Sean Hommel, DO  atorvastatin (LIPITOR) 80 MG tablet TAKE 1 TABLET BY MOUTH EVERY DAY 05/07/16   Marcial Pacas, DO  Cholecalciferol (VITAMIN D-3 PO) Take 2,000 Int'l Units by mouth daily.     Historical Provider, MD  finasteride (PROSCAR) 5 MG tablet Take 1 tablet (5 mg total) by mouth daily. 11/05/15   Marcial Pacas, DO  Fish Oil-Cholecalciferol (FISH OIL + D3 PO) Take by mouth.    Historical Provider, MD  metoprolol succinate (TOPROL-XL) 100 MG 24 hr tablet Take 1 tablet (100 mg total) by mouth daily. Take with or immediately following a meal. 03/31/16   Sean Hommel,  DO  MULTIPLE VITAMIN PO Take by mouth.    Historical Provider, MD  niacin 500 MG CR capsule Take 1 capsule (500 mg total) by mouth at bedtime. 03/31/16   Sean Hommel, DO  Omega-3 1000 MG CAPS Take by mouth.    Historical Provider, MD  pantoprazole (PROTONIX) 40 MG tablet TAKE 1 TABLET (40 MG TOTAL) BY MOUTH DAILY. 06/02/16   Marcial Pacas, DO    Family History Family History  Problem Relation Age of Onset  . Alcohol abuse Brother   . Cancer Brother   . Diabetes Brother   . Hyperlipidemia Brother   . Hypertension Brother   . Diabetes Mother   . Hypertension Mother   . Hyperlipidemia Mother   . Heart attack Mother   . Hyperlipidemia Father   . Hypertension Father     Social History Social History  Substance Use Topics  . Smoking status: Former Research scientist (life sciences)  . Smokeless tobacco: Former Systems developer  . Alcohol use 0.0 oz/week     Comment: 2-8 times per year     Allergies   Dexilant [dexlansoprazole]   Review of Systems Review of Systems  Constitutional: Negative for diaphoresis and fever.  HENT: Negative for sore throat.   Eyes: Negative for visual disturbance.  Respiratory: Negative for shortness of breath.   Cardiovascular: Positive for chest pain. Negative for leg swelling.  Gastrointestinal: Positive for constipation. Negative for abdominal pain, diarrhea, nausea and vomiting.  Genitourinary: Negative for difficulty urinating.  Musculoskeletal: Negative for back pain and neck stiffness.  Skin: Negative for rash.  Neurological: Negative for syncope, light-headedness and headaches.     Physical Exam Updated Vital Signs BP 131/78   Pulse 73   Temp 98.5 F (36.9 C) (Oral)   Resp 19   Ht 5\' 9"  (1.753 m)   Wt 273 lb (123.8 kg)   SpO2 95%   BMI 40.32 kg/m   Physical Exam  Constitutional: He is oriented to person, place, and time. He appears well-developed and well-nourished. No distress.  HENT:  Head: Normocephalic and atraumatic.  Eyes: Conjunctivae and EOM are normal.    Neck: Normal range of motion.  Cardiovascular: Normal rate, regular rhythm, normal heart sounds and intact distal pulses.  Exam reveals no gallop and no friction rub.   No murmur heard. Pulmonary/Chest: Effort normal and breath sounds normal. No respiratory distress. He has no wheezes. He has no rales. He exhibits tenderness (focal tenderness over manubrium).  Abdominal: Soft. He exhibits no distension. There is no tenderness. There is no guarding.  Musculoskeletal: He exhibits no edema.  Neurological: He is alert and oriented to person, place, and time.  Skin: Skin is warm and dry. He is  not diaphoretic.  Nursing note and vitals reviewed.    ED Treatments / Results  Labs (all labs ordered are listed, but only abnormal results are displayed) Labs Reviewed  BASIC METABOLIC PANEL - Abnormal; Notable for the following:       Result Value   Glucose, Bld 139 (*)    All other components within normal limits  CBG MONITORING, ED - Abnormal; Notable for the following:    Glucose-Capillary 138 (*)    All other components within normal limits  CBC  TROPONIN I  TROPONIN I    EKG  EKG Interpretation  Date/Time:  Wednesday August 18 2016 09:52:22 EDT Ventricular Rate:  84 PR Interval:    QRS Duration: 111 QT Interval:  380 QTC Calculation: 450 R Axis:   -29 Text Interpretation:  Sinus rhythm Borderline left axis deviation No previous ECGs available Confirmed by [facility] MD, [place] ([id number]) on 08/18/2016 10:37:52 AM       Radiology Dg Chest 2 View  Result Date: 08/18/2016 CLINICAL DATA:  Chest pain starting yesterday. EXAM: CHEST  2 VIEW COMPARISON:  None. FINDINGS: Atherosclerotic calcification of the aortic arch. Heart size within normal limits. Mild lingular subsegmental atelectasis or scarring. Slight lobularity of the right hemidiaphragm. No blunting of the costophrenic angles.  Mild thoracic spondylosis. IMPRESSION: 1. Atherosclerotic aortic arch but no cardiomegaly or  pulmonary edema identified. 2. Subsegmental atelectasis or scarring in the lingula. Electronically Signed   By: Van Clines M.D.   On: 08/18/2016 10:15    Procedures Procedures (including critical care time)  Medications Ordered in ED Medications - No data to display   Initial Impression / Assessment and Plan / ED Course  I have reviewed the triage vital signs and the nursing notes.  Pertinent labs & imaging results that were available during my care of the patient were reviewed by me and considered in my medical decision making (see chart for details).  Clinical Course    65 year old male with history of htn, hlpe, CAD, PVD, DM, presents with concern of chest pain. Differential diagnosis for chest pain includes pulmonary embolus, dissection, pneumothorax, pneumonia, ACS, myocarditis, pericarditis.  EKG was done and evaluate by me and showed no acute ST changes and no signs of pericarditis. Chest x-ray was done and evaluated by me and radiology and showed no sign of pneumonia or pneumothorax. No dyspnea or tachycardia nor hypoxia and have low suspicion for PE.  Delta troponins negative, ECG unchanged.  Given no exertional symptoms, and focal pain and tenderness over manubrium suspect move likely musculoskeletal etiology of this chest pain, however given hx of CAD, recommend close follow up with PCP and Cardiology. Patient discharged in stable condition with understanding of reasons to return.    Final diagnoses:  Chest wall pain  Chest pain, unspecified type    New Prescriptions Discharge Medication List as of 08/18/2016  2:31 PM       Gareth Morgan, MD 08/19/16 0021

## 2016-08-18 NOTE — Telephone Encounter (Signed)
Scheduled pt to establish care with Dr. Lorelei Pont for 09/29/16. While scheduling pt mentioned having some shoulder and chest discomfort that started yesterday morning. Transferred pt to [facility], spoke with Maudie Mercury. (pt coordinator)

## 2016-08-18 NOTE — ED Notes (Signed)
MD at bedside. 

## 2016-08-18 NOTE — ED Notes (Signed)
Pt's wife in lobby updated on plan of care per pt request. She declined to come back to room

## 2016-08-18 NOTE — Telephone Encounter (Signed)
Patient Name: David Singh  DOB: [birth year]    Initial Comment Caller states he's having shoulder discomfort, and around chest area. Bottom of neck pain.   Nurse Assessment  Nurse: Julien Girt, RN, [facility] Date/Time Eilene Ghazi Time): 08/18/2016 9:01:56 AM  Confirm and document reason for call. If symptomatic, describe symptoms. You must click the next button to save text entered. ---Caller states he woke up on Tuesday with pain at the base of his neck, below collar bone, that radiated down through his ribs. States it was tender to touch. He was unable to sleep last night due to this pain, that began radiating through to his shoulder blades. This morning he has pain in the same area, below collar bone but in center of upper chest and shoulder blades.  Has the patient traveled out of the country within the last 30 days? ---Not Applicable  Does the patient have any new or worsening symptoms? ---Yes  Will a triage be completed? ---Yes  Related visit to physician within the last 2 weeks? ---No  Does the PT have any chronic conditions? (i.e. diabetes, asthma, etc.) ---Yes  List chronic conditions. ---Htn, High Cholesterol, Anxiety, Cardiac Stent, Inquinal Hernia, CVD, Type 2 diabetic  Is this a behavioral health or substance abuse call? ---No     Guidelines    Guideline Title Affirmed Question Affirmed Notes  Chest Pain [1] Chest pain lasts > 5 minutes AND [2] history of heart disease (i.e., heart attack, bypass surgery, angina, angioplasty, CHF; not just a heart murmur)    Final Disposition User   Call EMS 911 Now Julien Girt, RN, [facility]    Referrals  [facility] [place] - ED   Disagree/Comply: Disagree  Disagree/Comply Reason: Disagree with instructions   08/18/2016 9:19:59 AM 911 Outcome Documentation Julien Girt, RN, [facility] Reason: Caller refused to call 911, his wife will drive him to the ED

## 2016-08-18 NOTE — ED Triage Notes (Addendum)
Pt reports chest pain since yesterday "ache". States pain was worse when he pushed on his sternum and pain was worse when lying down last night. Pt had a cardiac stent placed 10 years ago. Denies pain at this time

## 2016-09-20 ENCOUNTER — Other Ambulatory Visit: Payer: Self-pay | Admitting: Sports Medicine

## 2016-09-20 DIAGNOSIS — E781 Pure hyperglyceridemia: Secondary | ICD-10-CM

## 2016-09-28 ENCOUNTER — Telehealth: Payer: Self-pay

## 2016-09-28 NOTE — Telephone Encounter (Signed)
Pre-visit call completed with  patient. 

## 2016-09-29 ENCOUNTER — Encounter: Payer: Self-pay | Admitting: Family Medicine

## 2016-09-29 ENCOUNTER — Ambulatory Visit (INDEPENDENT_AMBULATORY_CARE_PROVIDER_SITE_OTHER): Payer: BLUE CROSS/BLUE SHIELD | Admitting: Family Medicine

## 2016-09-29 VITALS — BP 140/86 | HR 72 | Temp 98.0°F | Ht 69.0 in | Wt 275.6 lb

## 2016-09-29 DIAGNOSIS — E119 Type 2 diabetes mellitus without complications: Secondary | ICD-10-CM | POA: Diagnosis not present

## 2016-09-29 DIAGNOSIS — I739 Peripheral vascular disease, unspecified: Secondary | ICD-10-CM

## 2016-09-29 DIAGNOSIS — J310 Chronic rhinitis: Secondary | ICD-10-CM

## 2016-09-29 DIAGNOSIS — I10 Essential (primary) hypertension: Secondary | ICD-10-CM | POA: Diagnosis not present

## 2016-09-29 DIAGNOSIS — T485X5A Adverse effect of other anti-common-cold drugs, initial encounter: Secondary | ICD-10-CM

## 2016-09-29 DIAGNOSIS — I2510 Atherosclerotic heart disease of native coronary artery without angina pectoris: Secondary | ICD-10-CM

## 2016-09-29 DIAGNOSIS — T485X1A Poisoning by other anti-common-cold drugs, accidental (unintentional), initial encounter: Secondary | ICD-10-CM

## 2016-09-29 DIAGNOSIS — E781 Pure hyperglyceridemia: Secondary | ICD-10-CM | POA: Diagnosis not present

## 2016-09-29 NOTE — Patient Instructions (Addendum)
I think that plain mucinex (guiafenesin) would be ok for your current URI symptoms. Let me know if you are nog getting back to normal soon.  Please try to taper off the nasal decongestant spray Have [facility] contact me if you need any refills of medications or test strips.   I am going to refer you to cardiology and vascular surgery but will ask them to schedule you for February or later so that your medicare will be in order Please plan to see me in February as well for labs, etc.  If you have any concerns in the meantime please let me know

## 2016-09-29 NOTE — Progress Notes (Signed)
[facility] [address] [place], [place] [address] [phone]  Date:  09/29/2016   Name:  David Singh   DOB:  01/20/1951   MRN:  [id number]  PCP:  Marcial Pacas, DO    Chief Complaint: No chief complaint on file.   History of Present Illness:  David Singh is a 65 y.o. very pleasant male patient who presents with the following:  Here today as a new patient to my practice History of HTN, DM2, dyslipidemia, PAD, CAD s/p angioplasty and stent placed in 2006.   Cardiologist: does not have one currently.  He was in the ER in October with chest pain- he had a rule-out He has right iliofemoral occlusive disease per [place] 02/14/15 He has pain in his leg with exertion.  He would like to have this treated surgically but will need to wait until spring.  Right how his insurnace coverage is minimal and he is awaiting his medicare starting in [place]  Here today to establish care, also with complaint of possible illness His past PCP has left the area so they were in need of a new provider  They moved here from [place] a couple of years ago.  Their daughter and SIL live here with their 4 children so they wanted to be closer. Their son is near [place]- he is unmarried He has an umbilical hernia and lower ventral hernia that are not really bothersome.    He thinks that he recently caught cold from his granddaughter- he has noted sx of illness for about one week- he has noted cough and chest congestion. This is getting better and he does not think it is anything serious.  However he was not sure what medication he could safely take for his sx No fever noted. No ST or earache.    Lab Results  Component Value Date   HGBA1C 6.9 03/31/2016   Will arrange vascular surgery for February for him- also would like to set up with cardiolgoy  He has been on a nasal decongestant spray for a long time He has been using this for 2 years,  notes that he has very annoying congestion if he does not use it, but knows that he needs to stop using this  Declines labs today- would like to wait until 2/18  Patient Active Problem List   Diagnosis Date Noted  . Chronic gout 03/09/2016  . Hypertriglyceridemia 01/16/2015  . Type 2 diabetes mellitus ([place]) 01/06/2015  . CAD in native artery 01/06/2015  . History of coronary artery stent placement 01/06/2015  . PAD (peripheral artery disease) ([place]) 01/06/2015  . Hyperlipidemia 01/06/2015  . Essential hypertension 01/06/2015    Past Medical History:  Diagnosis Date  . Diabetes mellitus without complication ([place])   . GERD (gastroesophageal reflux disease)   . Hyperlipidemia   . Hypertension   . Peripheral vascular disease [facility])     Past Surgical History:  Procedure Laterality Date  . CARDIAC SURGERY    . CORONARY ANGIOPLASTY WITH STENT PLACEMENT    . MENISCUS REPAIR Right   . NASAL SINUS SURGERY    . TONSILLECTOMY      Social History  Substance Use Topics  . Smoking status: Former Research scientist (life sciences)  . Smokeless tobacco: Former Systems developer  . Alcohol use 0.0 oz/week     Comment: 2-8 times per year    Family History  Problem Relation Age of Onset  . Alcohol abuse  Brother   . Cancer Brother   . Diabetes Brother   . Hyperlipidemia Brother   . Hypertension Brother   . Diabetes Mother   . Hypertension Mother   . Hyperlipidemia Mother   . Heart attack Mother   . Hyperlipidemia Father   . Hypertension Father     Allergies  Allergen Reactions  . Dexilant [Dexlansoprazole]     Pressure in stomach and chest    Medication list has been reviewed and updated.  Current Outpatient Prescriptions on File Prior to Visit  Medication Sig Dispense Refill  . amLODipine (NORVASC) 10 MG tablet Take 1 tablet (10 mg total) by mouth daily. 90 tablet 1  . anastrozole (ARIMIDEX) 1 MG tablet Take 1 mg by mouth 3 (three) times a week.    Marland Kitchen aspirin 81 MG tablet Take 81 mg by mouth daily.    Marland Kitchen  atorvastatin (LIPITOR) 40 MG tablet Take 40 mg by mouth daily.    Marland Kitchen atorvastatin (LIPITOR) 80 MG tablet TAKE 1 TABLET BY MOUTH EVERY DAY 90 tablet 0  . busPIRone (BUSPAR) 10 MG tablet Take 1 tablet (10 mg total) by mouth 3 (three) times daily. (Patient taking differently: Take 7.5 mg by mouth daily. ) 90 tablet 5  . carvedilol (COREG) 25 MG tablet Take 25 mg by mouth 2 (two) times daily with a meal.    . Cholecalciferol (VITAMIN D-3 PO) Take 2,000 Int'l Units by mouth daily.     . clopidogrel (PLAVIX) 75 MG tablet Take 1 tablet (75 mg total) by mouth daily. 90 tablet 3  . co-enzyme Q-10 30 MG capsule Take 30 mg by mouth 3 (three) times daily.    . Cyanocobalamin (VITAMIN B-12) 2500 MCG SUBL Place under the tongue.    . fenofibrate 160 MG tablet TAKE 1 TABLET(160 MG) BY MOUTH DAILY. NEED FOLLOW UP VISIT FOR MORE REFILLS 30 tablet 0  . finasteride (PROSCAR) 5 MG tablet Take 1 tablet (5 mg total) by mouth daily. 90 tablet 2  . Fish Oil-Cholecalciferol (FISH OIL + D3 PO) Take by mouth.    Marland Kitchen lisinopril (PRINIVIL,ZESTRIL) 40 MG tablet Take 1 tablet (40 mg total) by mouth daily. 90 tablet 1  . metFORMIN (GLUCOPHAGE) 1000 MG tablet TAKE ONE TABLET BY MOUTH TWICE DAILY WITH MEALS (Patient taking differently: 500 mg 2 (two) times daily with a meal. TAKE ONE TABLET BY MOUTH TWICE DAILY WITH MEALS) 180 tablet 1  . metoprolol succinate (TOPROL-XL) 100 MG 24 hr tablet Take 1 tablet (100 mg total) by mouth daily. Take with or immediately following a meal. 90 tablet 1  . MULTIPLE VITAMIN PO Take by mouth.    . niacin 500 MG CR capsule Take 1 capsule (500 mg total) by mouth at bedtime. 90 capsule 1  . Omega-3 1000 MG CAPS Take by mouth.    . pantoprazole (PROTONIX) 40 MG tablet TAKE 1 TABLET (40 MG TOTAL) BY MOUTH DAILY. 180 tablet 0  . ranitidine (ZANTAC) 300 MG capsule Take 300 mg by mouth every evening.     No current facility-administered medications on file prior to visit.     Review of Systems:  As per  HPI- otherwise negative.   Physical Examination: Blood pressure 140/86, pulse 72, temperature 98 F (36.7 C), temperature source Oral, height 5\' 9"  (1.753 m), weight 275 lb 9.6 oz (125 kg), SpO2 99 %. Body mass index is 40.7 kg/m.  Ideal Body Weight:    GEN: WDWN, NAD, Non-toxic, A & O x  3, obese, otherwise looks well HEENT: Atraumatic, Normocephalic. Neck supple. No masses, No LAD.  Bilateral TM wnl, oropharynx normal.  PEERL,EOMI.   Nasal cavity is normal  Ears and Nose: No external deformity. CV: RRR, No M/G/R. No JVD. No thrill. No extra heart sounds. PULM: CTA B, no wheezes, crackles, rhonchi. No retractions. No resp. distress. No accessory muscle use. EXTR: No c/c/e NEURO Normal gait.  PSYCH: Normally interactive. Conversant. Not depressed or anxious appearing.  Calm demeanor.    Assessment and Plan: Hypertriglyceridemia  Type 2 diabetes mellitus without complication, without long-term current use of insulin (HCC)  Essential hypertension  CAD in native artery - Plan: Ambulatory referral to Cardiology  PAD (peripheral artery disease) ([place]) - Plan: Ambulatory referral to Vascular Surgery  Rhinitis medicamentosa  Here today to establish care.  He declines labs today as he would like to wait until February- this is ok Referrals made to vascular surgery and cardiology for 2/18 Discussed tapering off his afrin and encouraged him to try and stop using this over a couple of weeks- he will try He can use plain mucinex for the time being See patient instructions for more details.     Signed Lamar Blinks, MD

## 2016-10-01 ENCOUNTER — Other Ambulatory Visit: Payer: Self-pay

## 2016-10-01 DIAGNOSIS — I739 Peripheral vascular disease, unspecified: Secondary | ICD-10-CM

## 2016-10-16 ENCOUNTER — Other Ambulatory Visit: Payer: Self-pay | Admitting: Sports Medicine

## 2016-10-16 DIAGNOSIS — E781 Pure hyperglyceridemia: Secondary | ICD-10-CM

## 2016-10-22 ENCOUNTER — Other Ambulatory Visit: Payer: Self-pay | Admitting: Family Medicine

## 2016-10-22 ENCOUNTER — Other Ambulatory Visit: Payer: Self-pay | Admitting: Emergency Medicine

## 2016-10-22 DIAGNOSIS — I10 Essential (primary) hypertension: Secondary | ICD-10-CM

## 2016-10-22 MED ORDER — METOPROLOL SUCCINATE ER 100 MG PO TB24: 100.0000 mg | ORAL_TABLET | Freq: Every day | ORAL | 3 refills | Status: DC

## 2016-10-22 NOTE — Telephone Encounter (Signed)
Caller name: Relationship to patient:Self Can be reached: [phone]  Pharmacy:  [facility] [place] - [address] AT [place] [phone] (Phone) [phone] (Fax)     Reason for call: Request refill metoprolol succinate (TOPROL-XL) 100 MG 24 hr tablet [id number]

## 2016-10-30 ENCOUNTER — Other Ambulatory Visit: Payer: Self-pay | Admitting: Family Medicine

## 2016-10-30 DIAGNOSIS — E119 Type 2 diabetes mellitus without complications: Secondary | ICD-10-CM

## 2016-10-30 DIAGNOSIS — I10 Essential (primary) hypertension: Secondary | ICD-10-CM

## 2016-11-17 ENCOUNTER — Other Ambulatory Visit: Payer: Self-pay | Admitting: Emergency Medicine

## 2016-11-17 DIAGNOSIS — E781 Pure hyperglyceridemia: Secondary | ICD-10-CM

## 2016-11-17 MED ORDER — NIACIN ER 500 MG PO CPCR: 500.0000 mg | ORAL_CAPSULE | Freq: Every day | ORAL | 3 refills | Status: DC

## 2016-11-17 MED ORDER — FINASTERIDE 5 MG PO TABS: 5.0000 mg | ORAL_TABLET | Freq: Every day | ORAL | 3 refills | Status: DC

## 2016-11-17 NOTE — Telephone Encounter (Signed)
Received request from [facility] for New Prescription for Niacin 500 MG and Finasteride 5 MG, 90 day supply. Both last refilled by Corinna Lines, DO (Niacin on 03/30/16 and Finasteride 5 MG on 11/05/15). Last office visit 09/29/16. Please advise.

## 2016-12-09 ENCOUNTER — Encounter: Payer: Medicare Other | Admitting: Family Medicine

## 2016-12-09 ENCOUNTER — Ambulatory Visit (INDEPENDENT_AMBULATORY_CARE_PROVIDER_SITE_OTHER): Payer: Medicare Other | Admitting: Medical

## 2016-12-09 ENCOUNTER — Encounter: Payer: Self-pay | Admitting: Medical

## 2016-12-09 VITALS — BP 140/90 | HR 73 | Temp 98.1°F | Ht 69.75 in | Wt 280.2 lb

## 2016-12-09 DIAGNOSIS — N401 Enlarged prostate with lower urinary tract symptoms: Secondary | ICD-10-CM | POA: Diagnosis not present

## 2016-12-09 DIAGNOSIS — I10 Essential (primary) hypertension: Secondary | ICD-10-CM | POA: Diagnosis not present

## 2016-12-09 DIAGNOSIS — Z23 Encounter for immunization: Secondary | ICD-10-CM

## 2016-12-09 DIAGNOSIS — Z1283 Encounter for screening for malignant neoplasm of skin: Secondary | ICD-10-CM | POA: Diagnosis not present

## 2016-12-09 DIAGNOSIS — Z1159 Encounter for screening for other viral diseases: Secondary | ICD-10-CM

## 2016-12-09 DIAGNOSIS — N3943 Post-void dribbling: Secondary | ICD-10-CM

## 2016-12-09 DIAGNOSIS — E119 Type 2 diabetes mellitus without complications: Secondary | ICD-10-CM | POA: Diagnosis not present

## 2016-12-09 DIAGNOSIS — Z125 Encounter for screening for malignant neoplasm of prostate: Secondary | ICD-10-CM

## 2016-12-09 DIAGNOSIS — Z0000 Encounter for general adult medical examination without abnormal findings: Secondary | ICD-10-CM | POA: Diagnosis not present

## 2016-12-09 DIAGNOSIS — E781 Pure hyperglyceridemia: Secondary | ICD-10-CM | POA: Diagnosis not present

## 2016-12-09 LAB — CBC WITH DIFFERENTIAL/PLATELET
BASOS PCT: 0.7 % (ref 0.0–3.0)
Basophils Absolute: 0 10*3/uL (ref 0.0–0.1)
EOS PCT: 2 % (ref 0.0–5.0)
Eosinophils Absolute: 0.1 10*3/uL (ref 0.0–0.7)
HCT: 40.4 % (ref 39.0–52.0)
Hemoglobin: 13.6 g/dL (ref 13.0–17.0)
LYMPHS ABS: 1.9 10*3/uL (ref 0.7–4.0)
Lymphocytes Relative: 26.3 % (ref 12.0–46.0)
MCHC: 33.7 g/dL (ref 30.0–36.0)
MCV: 87.8 fl (ref 78.0–100.0)
MONO ABS: 0.8 10*3/uL (ref 0.1–1.0)
MONOS PCT: 10.6 % (ref 3.0–12.0)
NEUTROS PCT: 60.4 % (ref 43.0–77.0)
Neutro Abs: 4.3 10*3/uL (ref 1.4–7.7)
Platelets: 198 10*3/uL (ref 150.0–400.0)
RBC: 4.6 Mil/uL (ref 4.22–5.81)
RDW: 13.3 % (ref 11.5–15.5)
WBC: 7.1 10*3/uL (ref 4.0–10.5)

## 2016-12-09 LAB — POC URINALSYSI DIPSTICK (AUTOMATED)
BILIRUBIN UA: NEGATIVE
Blood, UA: NEGATIVE
Glucose, UA: NEGATIVE
Ketones, UA: NEGATIVE
Leukocytes, UA: NEGATIVE
NITRITE UA: NEGATIVE
PH UA: 6
Protein, UA: NEGATIVE
Spec Grav, UA: 1.015
Urobilinogen, UA: NEGATIVE

## 2016-12-09 LAB — COMPREHENSIVE METABOLIC PANEL
ALT: 47 U/L (ref 0–53)
AST: 31 U/L (ref 0–37)
Albumin: 4.8 g/dL (ref 3.5–5.2)
Alkaline Phosphatase: 35 U/L — ABNORMAL LOW (ref 39–117)
BUN: 13 mg/dL (ref 6–23)
CALCIUM: 9.7 mg/dL (ref 8.4–10.5)
CHLORIDE: 102 meq/L (ref 96–112)
CO2: 27 meq/L (ref 19–32)
Creatinine, Ser: 0.85 mg/dL (ref 0.40–1.50)
GFR: 96.16 mL/min (ref >60.00)
Glucose, Bld: 113 mg/dL — ABNORMAL HIGH (ref 70–99)
POTASSIUM: 4.5 meq/L (ref 3.5–5.1)
Sodium: 135 mEq/L (ref 135–145)
Total Bilirubin: 0.5 mg/dL (ref 0.2–1.2)
Total Protein: 7.2 g/dL (ref 6.0–8.3)

## 2016-12-09 LAB — PSA: PSA: 0.91 ng/mL (ref 0.10–4.00)

## 2016-12-09 LAB — LDL CHOLESTEROL, DIRECT: LDL DIRECT: 94 mg/dL

## 2016-12-09 LAB — HEMOGLOBIN A1C: Hgb A1c MFr Bld: 7 % — ABNORMAL HIGH (ref 4.6–6.5)

## 2016-12-09 LAB — LIPID PANEL
CHOL/HDL RATIO: 5
CHOLESTEROL: 180 mg/dL (ref 0–200)
HDL: 38.1 mg/dL — ABNORMAL LOW (ref >39.00)
NonHDL: 141.8
TRIGLYCERIDES: 330 mg/dL — AB (ref 0.0–149.0)
VLDL: 66 mg/dL — AB (ref 0.0–40.0)

## 2016-12-09 LAB — HEPATITIS C ANTIBODY: HCV AB: NEGATIVE

## 2016-12-09 LAB — TSH: TSH: 0.75 u[IU]/mL (ref 0.35–4.50)

## 2016-12-09 MED ORDER — AMLODIPINE BESYLATE 10 MG PO TABS: 10.0000 mg | ORAL_TABLET | Freq: Every day | ORAL | 0 refills | Status: DC

## 2016-12-09 MED ORDER — METOPROLOL SUCCINATE ER 100 MG PO TB24: 100.0000 mg | ORAL_TABLET | Freq: Every day | ORAL | 3 refills | Status: DC

## 2016-12-09 MED ORDER — PANTOPRAZOLE SODIUM 40 MG PO TBEC: 40.0000 mg | DELAYED_RELEASE_TABLET | Freq: Every day | ORAL | 1 refills | Status: DC

## 2016-12-09 MED ORDER — FINASTERIDE 5 MG PO TABS: 5.0000 mg | ORAL_TABLET | Freq: Every day | ORAL | 3 refills | Status: DC

## 2016-12-09 MED ORDER — CLOPIDOGREL BISULFATE 75 MG PO TABS: 75.0000 mg | ORAL_TABLET | Freq: Every day | ORAL | 3 refills | Status: DC

## 2016-12-09 MED ORDER — BUSPIRONE HCL 10 MG PO TABS: 10.0000 mg | ORAL_TABLET | Freq: Two times a day (BID) | ORAL | 3 refills | Status: DC

## 2016-12-09 MED ORDER — LISINOPRIL 40 MG PO TABS: 40.0000 mg | ORAL_TABLET | Freq: Every day | ORAL | 1 refills | Status: DC

## 2016-12-09 MED ORDER — METFORMIN HCL 1000 MG PO TABS: 1000.0000 mg | ORAL_TABLET | Freq: Two times a day (BID) | ORAL | 0 refills | Status: DC

## 2016-12-09 MED ORDER — ATORVASTATIN CALCIUM 80 MG PO TABS: 80.0000 mg | ORAL_TABLET | Freq: Every day | ORAL | 0 refills | Status: DC

## 2016-12-09 MED ORDER — FENOFIBRATE 160 MG PO TABS: ORAL_TABLET | ORAL | 1 refills | Status: DC

## 2016-12-09 NOTE — Patient Instructions (Addendum)
For your wellness exam will get labs today. Give urine at lab to UA dip.  Tdap given today and hep c screening lab done today as well.  Refer for diabetic eye exam and refer to derm skin cancer screening.  When you are ready to see surgeon for umbilical hernia let us know.  Medications refilled today.  Will notify you of lab results when in.  Follow date to be determined after lab review  Also get new bp mahcine. Start to check daily. If bp greater then 140/90 consistently then will need bp med adjusted.(gave rx for bp cuff)   Preventive Care 40-64 Years, Male Preventive care refers to lifestyle choices and visits with your health care provider that can promote health and wellness. What does preventive care include?  A yearly physical exam. This is also called an annual well check.  Dental exams once or twice a year.  Routine eye exams. Ask your health care provider how often you should have your eyes checked.  Personal lifestyle choices, including:  Daily care of your teeth and gums.  Regular physical activity.  Eating a healthy diet.  Avoiding tobacco and drug use.  Limiting alcohol use.  Practicing safe sex.  Taking low-dose aspirin every day starting at age 72. What happens during an annual well check? The services and screenings done by your health care provider during your annual well check will depend on your age, overall health, lifestyle risk factors, and family history of disease. Counseling  Your health care provider may ask you questions about your:  Alcohol use.  Tobacco use.  Drug use.  Emotional well-being.  Home and relationship well-being.  Sexual activity.  Eating habits.  Work and work Statistician. Screening  You may have the following tests or measurements:  Height, weight, and BMI.  Blood pressure.  Lipid and cholesterol levels. These may be checked every 5 years, or more frequently if you are over 44 years old.  Skin  check.  Lung cancer screening. You may have this screening every year starting at age 31 if you have a 30-pack-year history of smoking and currently smoke or have quit within the past 15 years.  Fecal occult blood test (FOBT) of the stool. You may have this test every year starting at age 26.  Flexible sigmoidoscopy or colonoscopy. You may have a sigmoidoscopy every 5 years or a colonoscopy every 10 years starting at age 35.  Prostate cancer screening. Recommendations will vary depending on your family history and other risks.  Hepatitis C blood test.  Hepatitis B blood test.  Sexually transmitted disease (STD) testing.  Diabetes screening. This is done by checking your blood sugar (glucose) after you have not eaten for a while (fasting). You may have this done every 1-3 years. Discuss your test results, treatment options, and if necessary, the need for more tests with your health care provider. Vaccines  Your health care provider may recommend certain vaccines, such as:  Influenza vaccine. This is recommended every year.  Tetanus, diphtheria, and acellular pertussis (Tdap, Td) vaccine. You may need a Td booster every 10 years.  Varicella vaccine. You may need this if you have not been vaccinated.  Zoster vaccine. You may need this after age 63.  Measles, mumps, and rubella (MMR) vaccine. You may need at least one dose of MMR if you were born in 1957 or later. You may also need a second dose.  Pneumococcal 13-valent conjugate (PCV13) vaccine. You may need this if you have  certain conditions and have not been vaccinated.  Pneumococcal polysaccharide (PPSV23) vaccine. You may need one or two doses if you smoke cigarettes or if you have certain conditions.  Meningococcal vaccine. You may need this if you have certain conditions.  Hepatitis A vaccine. You may need this if you have certain conditions or if you travel or work in places where you may be exposed to hepatitis  A.  Hepatitis B vaccine. You may need this if you have certain conditions or if you travel or work in places where you may be exposed to hepatitis B.  Haemophilus influenzae type b (Hib) vaccine. You may need this if you have certain risk factors. Talk to your health care provider about which screenings and vaccines you need and how often you need them. This information is not intended to replace advice given to you by your health care provider. Make sure you discuss any questions you have with your health care provider. Document Released: 11/07/2015 Document Revised: 06/30/2016 Document Reviewed: 08/12/2015 Elsevier Interactive Patient Education  2017 [employer].

## 2016-12-09 NOTE — Progress Notes (Signed)
Pre visit review using our clinic review tool, if applicable. No additional management support is needed unless otherwise documented below in the visit note. 

## 2016-12-09 NOTE — Progress Notes (Signed)
Subjective:    Patient ID: David Singh, male    DOB: 10-Sep-1951, 66 y.o.   MRN: [id number]  HPI   I have reviewed pt PMH, PSH, FH, Social History and Surgical History  Pt is up to date on flu vaccine, zostavax and colonoscopy.  Pt due for tdap.   Pt only states only faint shaky feeling lat 2 days but no presently. No gross motor or neurologic or cardiac symptoms. Sugars have not been  Real high or low. Pt did not check his bp since battery corroded and likey ruined the machine. Pt states he may have white coat htn.   Review of Systems  Constitutional: Negative for chills, fatigue and fever.  HENT: Negative for congestion, ear pain, nosebleeds, postnasal drip, rhinorrhea, sinus pain, sinus pressure, sore throat and trouble swallowing.   Respiratory: Negative for cough, choking, shortness of breath and wheezing.   Gastrointestinal: Negative for abdominal pain, nausea and vomiting.  Genitourinary: Negative for decreased urine volume, dysuria, flank pain, testicular pain and urgency.       Pt brother had history of prostate cancer.   Pt has some leakage when urinates. Known bph.  Musculoskeletal: Negative for back pain, gait problem, myalgias and neck pain.  Skin: Negative for rash.  Neurological: Negative for dizziness, seizures, syncope, speech difficulty, weakness and headaches.  Hematological: Negative for adenopathy. Does not bruise/bleed easily.  Psychiatric/Behavioral: Negative for behavioral problems, confusion and hallucinations. The patient is not nervous/anxious.     Past Medical History:  Diagnosis Date  . Diabetes mellitus without complication ([place])   . GERD (gastroesophageal reflux disease)   . Hyperlipidemia   . Hypertension   . Peripheral vascular disease [facility])      Social History   Social History  . Marital status: Married    Spouse name: N/A  . Number of children: N/A  . Years of education: N/A   Occupational History  . Not on file.   Social  History Main Topics  . Smoking status: Former Research scientist (life sciences)  . Smokeless tobacco: Former Systems developer  . Alcohol use 0.0 oz/week     Comment: 2-8 times per year  . Drug use: No  . Sexual activity: Not on file   Other Topics Concern  . Not on file   Social History Narrative  . No narrative on file    Past Surgical History:  Procedure Laterality Date  . CARDIAC SURGERY    . CORONARY ANGIOPLASTY WITH STENT PLACEMENT    . MENISCUS REPAIR Right   . NASAL SINUS SURGERY    . TONSILLECTOMY      Family History  Problem Relation Age of Onset  . Alcohol abuse Brother   . Cancer Brother   . Diabetes Brother   . Hyperlipidemia Brother   . Hypertension Brother   . Diabetes Mother   . Hypertension Mother   . Hyperlipidemia Mother   . Heart attack Mother   . Hyperlipidemia Father   . Hypertension Father     Allergies  Allergen Reactions  . Dexilant [Dexlansoprazole]     Pressure in stomach and chest    Current Outpatient Prescriptions on File Prior to Visit  Medication Sig Dispense Refill  . amLODipine (NORVASC) 10 MG tablet TAKE 1 TABLET BY MOUTH ONCE DAILY 90 tablet 0  . anastrozole (ARIMIDEX) 1 MG tablet Take 1 mg by mouth 3 (three) times a week.    Marland Kitchen aspirin 81 MG tablet Take 81 mg by mouth daily.    Marland Kitchen  atorvastatin (LIPITOR) 80 MG tablet TAKE 1 TABLET BY MOUTH ONCE DAILY 90 tablet 0  . busPIRone (BUSPAR) 10 MG tablet Take 1 tablet (10 mg total) by mouth 3 (three) times daily. (Patient taking differently: Take 7.5 mg by mouth daily. ) 90 tablet 5  . carvedilol (COREG) 25 MG tablet Take 25 mg by mouth 2 (two) times daily with a meal.    . Cholecalciferol (VITAMIN D-3 PO) Take 2,000 Int'l Units by mouth daily.     . clopidogrel (PLAVIX) 75 MG tablet Take 1 tablet (75 mg total) by mouth daily. 90 tablet 3  . co-enzyme Q-10 30 MG capsule Take 30 mg by mouth 3 (three) times daily.    . Cyanocobalamin (VITAMIN B-12) 2500 MCG SUBL Place under the tongue.    . fenofibrate 160 MG tablet TAKE 1  TABLET(160 MG) BY MOUTH DAILY. NEED FOLLOW UP VISIT FOR MORE REFILLS 30 tablet 0  . finasteride (PROSCAR) 5 MG tablet Take 1 tablet (5 mg total) by mouth daily. 90 tablet 3  . Fish Oil-Cholecalciferol (FISH OIL + D3 PO) Take by mouth.    Marland Kitchen lisinopril (PRINIVIL,ZESTRIL) 40 MG tablet Take 1 tablet (40 mg total) by mouth daily. 90 tablet 1  . metFORMIN (GLUCOPHAGE) 1000 MG tablet TAKE 1 TABLET BY MOUTH TWICE DAILY WITH MEALS 180 tablet 0  . metoprolol succinate (TOPROL-XL) 100 MG 24 hr tablet Take 1 tablet (100 mg total) by mouth daily. Take with or immediately following a meal. 90 tablet 3  . MULTIPLE VITAMIN PO Take by mouth.    . niacin 500 MG CR capsule Take 1 capsule (500 mg total) by mouth at bedtime. 90 capsule 3  . Omega-3 1000 MG CAPS Take by mouth.    . pantoprazole (PROTONIX) 40 MG tablet TAKE 1 TABLET (40 MG TOTAL) BY MOUTH DAILY. 180 tablet 0  . ranitidine (ZANTAC) 300 MG capsule Take 300 mg by mouth every evening.     No current facility-administered medications on file prior to visit.     BP (!) 161/87   Pulse 73   Temp 98.1 F (36.7 C) (Oral)   Ht 5' 9.75" (1.772 m)   Wt 280 lb 3.2 oz (127.1 kg)   SpO2 99%   BMI 40.49 kg/m       Objective:   Physical Exam  General Mental Status- Alert. General Appearance- Not in acute distress.   Skin General:  Scattered borderline sized  moles on his back.  Neck Carotid Arteries- Normal color. Moisture- Normal Moisture. No carotid bruits. No JVD.  Chest and Lung Exam Auscultation: Breath Sounds:-Normal.  Cardiovascular Auscultation:Rythm- Regular. Murmurs & Other Heart Sounds:Auscultation of the heart reveals- No Murmurs.  Abdomen Inspection:-Inspeection Normal. Palpation/Percussion:Note:No mass. Palpation and Percussion of the abdomen reveal- Non Tender, Non Distended + BS, no rebound or guarding.(pt had ventral hernia on exam)    Neurologic Cranial Nerve exam:- CN III-XII intact(No nystagmus), symmetric  smile. Strength:- 5/5 equal and symmetric strength both upper and lower extremities.   Male Genitourinary Urethra:- No discharge. Penis- Circumcised. Scrotum- No masses. Testes- Bilateral-Normal.  Rectal Anorectal Exam: Performed- Normal sphincter tone. No masses noted. Prostate smooth normal size. Stool HEME Negative.          Assessment & Plan:  For your wellness exam will get labs today. Give urine at lab to UA dip.  Tdap given today and hep c screening lab done today as well.  Refer for diabetic eye exam and refer to derm skin cancer  screening.  When you are ready to see surgeon for umbilical hernia let us know.  Medications refilled today.  Will notify you of lab results when in.  Follow date to be determined after lab review

## 2016-12-10 ENCOUNTER — Telehealth: Payer: Self-pay | Admitting: Medical

## 2016-12-10 NOTE — Telephone Encounter (Signed)
Opened to review 

## 2016-12-15 ENCOUNTER — Encounter: Payer: Medicare Other | Admitting: Medical

## 2016-12-15 ENCOUNTER — Encounter: Payer: Self-pay | Admitting: Vascular Surgery

## 2016-12-15 ENCOUNTER — Encounter: Payer: Self-pay | Admitting: *Deleted

## 2016-12-15 NOTE — Telephone Encounter (Signed)
Opened in error

## 2016-12-22 ENCOUNTER — Encounter: Payer: Self-pay | Admitting: Vascular Surgery

## 2016-12-22 ENCOUNTER — Other Ambulatory Visit: Payer: Self-pay

## 2016-12-22 ENCOUNTER — Ambulatory Visit (HOSPITAL_COMMUNITY)
Admission: RE | Admit: 2016-12-22 | Discharge: 2016-12-22 | Disposition: A | Discharge status: Home / Self Care | Payer: Medicare Other | Source: Ambulatory Visit | Attending: Vascular Surgery | Admitting: Vascular Surgery

## 2016-12-22 ENCOUNTER — Ambulatory Visit (INDEPENDENT_AMBULATORY_CARE_PROVIDER_SITE_OTHER): Payer: Medicare Other | Admitting: Vascular Surgery

## 2016-12-22 VITALS — BP 148/94 | HR 75 | Temp 98.2°F | Resp 20 | Ht 69.0 in | Wt 280.0 lb

## 2016-12-22 DIAGNOSIS — I739 Peripheral vascular disease, unspecified: Secondary | ICD-10-CM | POA: Diagnosis not present

## 2016-12-22 NOTE — Progress Notes (Signed)
Patient name: David Singh MRN: [id number] DOB: 1951/03/01 Sex: male  REASON FOR VISIT: Follow up of peripheral vascular disease. Referred by Dr. Lorelei Pont.  HPI: David Singh is a 66 y.o. male who I saw in consultation on 02/14/2015 with claudication. At that time he had an ABI of 86% on the right and 100% on the left. He did have a monophasic common femoral artery waveform in the right suggesting some inflow disease on the right. We discussed the importance of a structured walking program. I will order a follow up study in 6 months but have not seen him back since that time.  He continues to have right thigh hip and calf claudication which is brought on by ambulation and relieved with rest. He has minimal symptoms on the left side. His symptoms have gradually progressed over the last year. He denies any rest pain or history of nonhealing ulcers.  He has had previous coronary angioplasty and stenting in 2006 in [place] and is on Plavix.  He quit smoking in 1994.  Past Medical History:  Diagnosis Date  . Diabetes mellitus without complication ([place])   . GERD (gastroesophageal reflux disease)   . Hyperlipidemia   . Hypertension   . Peripheral vascular disease ([place])     Family History  Problem Relation Age of Onset  . Alcohol abuse Brother   . Cancer Brother   . Diabetes Brother   . Hyperlipidemia Brother   . Hypertension Brother   . Diabetes Mother   . Hypertension Mother   . Hyperlipidemia Mother   . Heart attack Mother   . Hyperlipidemia Father   . Hypertension Father     SOCIAL HISTORY: Social History  Substance Use Topics  . Smoking status: Former Smoker    Quit date: 1994  . Smokeless tobacco: Former Systems developer  . Alcohol use 0.0 oz/week     Comment: 2-8 times per year    Allergies  Allergen Reactions  . Dexilant [Dexlansoprazole]     Pressure in stomach and chest    Current Outpatient Prescriptions  Medication Sig Dispense Refill  . amLODipine (NORVASC) 10 MG  tablet Take 1 tablet (10 mg total) by mouth daily. 90 tablet 0  . aspirin 81 MG tablet Take 81 mg by mouth daily.    Marland Kitchen atorvastatin (LIPITOR) 80 MG tablet Take 1 tablet (80 mg total) by mouth daily. 90 tablet 0  . busPIRone (BUSPAR) 10 MG tablet Take 1 tablet (10 mg total) by mouth 2 (two) times daily. 60 tablet 3  . clopidogrel (PLAVIX) 75 MG tablet Take 1 tablet (75 mg total) by mouth daily. 90 tablet 3  . co-enzyme Q-10 30 MG capsule Take 30 mg by mouth 3 (three) times daily.    . Cyanocobalamin (VITAMIN B-12) 2500 MCG SUBL Place under the tongue.    . fenofibrate 160 MG tablet TAKE 1 TABLET(160 MG) BY MOUTH DAILY. 90 tablet 1  . finasteride (PROSCAR) 5 MG tablet Take 1 tablet (5 mg total) by mouth daily. 90 tablet 3  . Fish Oil-Cholecalciferol (FISH OIL + D3 PO) Take by mouth.    Marland Kitchen lisinopril (PRINIVIL,ZESTRIL) 40 MG tablet Take 1 tablet (40 mg total) by mouth daily. 90 tablet 1  . metFORMIN (GLUCOPHAGE) 1000 MG tablet Take 1 tablet (1,000 mg total) by mouth 2 (two) times daily with a meal. 180 tablet 0  . metoprolol succinate (TOPROL-XL) 100 MG 24 hr tablet Take 1 tablet (100 mg total) by mouth daily. Take with or immediately  following a meal. 90 tablet 3  . MULTIPLE VITAMIN PO Take by mouth.    . niacin 500 MG tablet Take 500 mg by mouth at bedtime.    . pantoprazole (PROTONIX) 40 MG tablet Take 1 tablet (40 mg total) by mouth daily. 180 tablet 1   No current facility-administered medications for this visit.     REVIEW OF SYSTEMS:  [X]  denotes positive finding, [ ]  denotes negative finding Cardiac  Comments:  Chest pain or chest pressure:    Shortness of breath upon exertion: X   Short of breath when lying flat:    Irregular heart rhythm:        Vascular    Pain in calf, thigh, or hip brought on by ambulation:    Pain in feet at night that wakes you up from your sleep:     Blood clot in your veins:    Leg swelling:         Pulmonary    Oxygen at home:    Productive cough:      Wheezing:         Neurologic    Sudden weakness in arms or legs:     Sudden numbness in arms or legs:     Sudden onset of difficulty speaking or slurred speech:    Temporary loss of vision in one eye:     Problems with dizziness:         Gastrointestinal    Blood in stool:     Vomited blood:         Genitourinary    Burning when urinating:     Blood in urine:        Psychiatric    Major depression:         Hematologic    Bleeding problems:    Problems with blood clotting too easily:        Skin    Rashes or ulcers:        Constitutional    Fever or chills:      PHYSICAL EXAM: Vitals:   12/22/16 1256  BP: (!) 148/94  Pulse: 75  Resp: 20  Temp: 98.2 F (36.8 C)  TempSrc: Oral  SpO2: 95%  Weight: 280 lb (127 kg)  Height: 5\' 9"  (1.753 m)    GENERAL: The patient is a well-nourished male, in no acute distress. The vital signs are documented above. CARDIAC: There is a regular rate and rhythm.  VASCULAR: I do not detect carotid bruits. Because of his weight, it is difficult to palpate femoral pulses. I cannot palpate pedal pulses on the right. He does have a palpable left posterior tibial pulse. He has mild bilateral lower extremity swelling. PULMONARY: There is good air exchange bilaterally without wheezing or rales. ABDOMEN: Soft and non-tender with normal pitched bowel sounds.  MUSCULOSKELETAL: There are no major deformities or cyanosis. NEUROLOGIC: No focal weakness or paresthesias are detected. SKIN: There are no ulcers or rashes noted. PSYCHIATRIC: The patient has a normal affect.  DATA:   BILATERAL LOWER EXTREMITY ARTERIAL DOPPLER STUDY: I have independently interpreted his bilateral lower extremity arterial Doppler study.  On the right side, which is the symptomatic side, he has a monophasic posterior tibial signal with a biphasic dorsalis pedis signal. ABI is 84% which is stable compared to one year ago. Toe pressures 80 mmHg.  On the left side, he  has an ABI of 100%. As a triphasic posterior tibial signal with a biphasic dorsalis pedis signal.  Toe pressure on the left is 96 mmHg.   MEDICAL ISSUES:  AORTOILIAC OCCLUSIVE DISEASE:  Although his Doppler study looks reasonable at rest, I suspect that he has right common iliac artery or external iliac artery disease which explains his symptoms. We've discussed conservative treatment versus arteriography and possible angioplasty and stenting. He states that since he moved here to [place] he has been anxious to do some hiking with his wife and therefore feels that this is significantly limiting him. He would like to proceed with arteriography and possible angioplasty and stenting of the right iliac artery. I will make further recommendations pending these results.  I have reviewed with the patient the indications for arteriography. In addition, I have reviewed the potential complications of arteriography including but not limited to: Bleeding, arterial injury, arterial thrombosis, dye action, renal insufficiency, or other unpredictable medical problems. I have explained to the patient that if we find disease amenable to angioplasty we could potentially address this at the same time. I have discussed the potential complications of angioplasty and stenting, including but not limited to: Bleeding, arterial thrombosis, arterial injury, dissection, or the need for surgical intervention.  HYPERTENSION: The patient's initial blood pressure today was elevated. We repeated this and this was still elevated. We have encouraged the patient to follow up with their primary care physician for management of their blood pressure.   Deitra Mayo [facility] [phone]

## 2016-12-27 ENCOUNTER — Encounter (HOSPITAL_COMMUNITY): Payer: Self-pay | Admitting: Vascular Surgery

## 2016-12-27 ENCOUNTER — Telehealth: Payer: Self-pay | Admitting: Vascular Surgery

## 2016-12-27 ENCOUNTER — Ambulatory Visit (HOSPITAL_COMMUNITY)
Admission: RE | Admit: 2016-12-27 | Discharge: 2016-12-27 | Disposition: A | Discharge status: Home / Self Care | Payer: Medicare Other | Source: Ambulatory Visit | Attending: Vascular Surgery | Admitting: Vascular Surgery

## 2016-12-27 ENCOUNTER — Encounter (HOSPITAL_COMMUNITY)
Admission: RE | Disposition: A | Discharge status: Home / Self Care | Payer: Self-pay | Source: Ambulatory Visit | Attending: Vascular Surgery

## 2016-12-27 DIAGNOSIS — Z8249 Family history of ischemic heart disease and other diseases of the circulatory system: Secondary | ICD-10-CM | POA: Diagnosis not present

## 2016-12-27 DIAGNOSIS — Z7982 Long term (current) use of aspirin: Secondary | ICD-10-CM | POA: Diagnosis not present

## 2016-12-27 DIAGNOSIS — Z955 Presence of coronary angioplasty implant and graft: Secondary | ICD-10-CM | POA: Diagnosis not present

## 2016-12-27 DIAGNOSIS — Z833 Family history of diabetes mellitus: Secondary | ICD-10-CM | POA: Insufficient documentation

## 2016-12-27 DIAGNOSIS — Z7902 Long term (current) use of antithrombotics/antiplatelets: Secondary | ICD-10-CM | POA: Insufficient documentation

## 2016-12-27 DIAGNOSIS — I70211 Atherosclerosis of native arteries of extremities with intermittent claudication, right leg: Secondary | ICD-10-CM | POA: Diagnosis not present

## 2016-12-27 DIAGNOSIS — E1151 Type 2 diabetes mellitus with diabetic peripheral angiopathy without gangrene: Secondary | ICD-10-CM | POA: Insufficient documentation

## 2016-12-27 DIAGNOSIS — I70213 Atherosclerosis of native arteries of extremities with intermittent claudication, bilateral legs: Secondary | ICD-10-CM | POA: Insufficient documentation

## 2016-12-27 DIAGNOSIS — Z6841 Body Mass Index (BMI) 40.0 and over, adult: Secondary | ICD-10-CM | POA: Insufficient documentation

## 2016-12-27 DIAGNOSIS — K219 Gastro-esophageal reflux disease without esophagitis: Secondary | ICD-10-CM | POA: Diagnosis not present

## 2016-12-27 DIAGNOSIS — I10 Essential (primary) hypertension: Secondary | ICD-10-CM | POA: Diagnosis not present

## 2016-12-27 DIAGNOSIS — Z87891 Personal history of nicotine dependence: Secondary | ICD-10-CM | POA: Diagnosis not present

## 2016-12-27 DIAGNOSIS — E669 Obesity, unspecified: Secondary | ICD-10-CM | POA: Diagnosis not present

## 2016-12-27 DIAGNOSIS — Z7984 Long term (current) use of oral hypoglycemic drugs: Secondary | ICD-10-CM | POA: Diagnosis not present

## 2016-12-27 DIAGNOSIS — E785 Hyperlipidemia, unspecified: Secondary | ICD-10-CM | POA: Insufficient documentation

## 2016-12-27 HISTORY — PX: ABDOMINAL AORTOGRAM W/LOWER EXTREMITY: CATH118223

## 2016-12-27 LAB — POCT I-STAT, CHEM 8
BUN: 9 mg/dL (ref 6–20)
CHLORIDE: 103 mmol/L (ref 101–111)
CREATININE: 0.8 mg/dL (ref 0.61–1.24)
Calcium, Ion: 1.26 mmol/L (ref 1.15–1.40)
GLUCOSE: 156 mg/dL — AB (ref 65–99)
HCT: 40 % (ref 39.0–52.0)
HEMOGLOBIN: 13.6 g/dL (ref 13.0–17.0)
POTASSIUM: 4 mmol/L (ref 3.5–5.1)
Sodium: 141 mmol/L (ref 135–145)
TCO2: 26 mmol/L (ref 0–100)

## 2016-12-27 LAB — GLUCOSE, CAPILLARY: Glucose-Capillary: 121 mg/dL — ABNORMAL HIGH (ref 65–99)

## 2016-12-27 SURGERY — ABDOMINAL AORTOGRAM W/LOWER EXTREMITY

## 2016-12-27 MED ORDER — FENTANYL CITRATE (PF) 100 MCG/2ML IJ SOLN
INTRAMUSCULAR | Status: DC | PRN
  Administered 2016-12-27: 50 ug via INTRAVENOUS

## 2016-12-27 MED ORDER — HEPARIN (PORCINE) IN NACL 2-0.9 UNIT/ML-% IJ SOLN
INTRAMUSCULAR | Status: AC
  Filled 2016-12-27: qty 1000

## 2016-12-27 MED ORDER — FENTANYL CITRATE (PF) 100 MCG/2ML IJ SOLN
INTRAMUSCULAR | Status: AC
  Filled 2016-12-27: qty 2

## 2016-12-27 MED ORDER — LIDOCAINE HCL (PF) 1 % IJ SOLN
INTRAMUSCULAR | Status: AC
  Filled 2016-12-27: qty 30

## 2016-12-27 MED ORDER — LIDOCAINE HCL (PF) 1 % IJ SOLN
INTRAMUSCULAR | Status: DC | PRN
  Administered 2016-12-27: 20 mL

## 2016-12-27 MED ORDER — HEPARIN (PORCINE) IN NACL 2-0.9 UNIT/ML-% IJ SOLN
INTRAMUSCULAR | Status: DC | PRN
  Administered 2016-12-27: 1000 mL

## 2016-12-27 MED ORDER — MIDAZOLAM HCL 2 MG/2ML IJ SOLN
INTRAMUSCULAR | Status: AC
  Filled 2016-12-27: qty 2

## 2016-12-27 MED ORDER — IODIXANOL 320 MG/ML IV SOLN
INTRAVENOUS | Status: DC | PRN
  Administered 2016-12-27: 167 mL via INTRA_ARTERIAL

## 2016-12-27 MED ORDER — MIDAZOLAM HCL 2 MG/2ML IJ SOLN
INTRAMUSCULAR | Status: DC | PRN
  Administered 2016-12-27: 1 mg via INTRAVENOUS

## 2016-12-27 MED ORDER — SODIUM CHLORIDE 0.9 % IV SOLN
INTRAVENOUS | Status: DC
  Administered 2016-12-27: 07:00:00 via INTRAVENOUS

## 2016-12-27 MED ORDER — SODIUM CHLORIDE 0.9 % IV SOLN: 1.0000 mL/kg/h | INTRAVENOUS | Status: DC

## 2016-12-27 SURGICAL SUPPLY — 11 items
CATH ANGIO 5F BER2 65CM (CATHETERS) ×2 IMPLANT
CATH ANGIO 5F PIGTAIL 65CM (CATHETERS) ×2 IMPLANT
CATH STRAIGHT 5FR 65CM (CATHETERS) ×4 IMPLANT
GUIDEWIRE ANGLED .035X150CM (WIRE) ×2 IMPLANT
KIT PV (KITS) ×2 IMPLANT
SHEATH PINNACLE 5F 10CM (SHEATH) ×2 IMPLANT
SYR MEDRAD MARK V 150ML (SYRINGE) ×2 IMPLANT
TRANSDUCER W/STOPCOCK (MISCELLANEOUS) ×2 IMPLANT
TRAY PV CATH (CUSTOM PROCEDURE TRAY) ×2 IMPLANT
WIRE HITORQ VERSACORE ST 145CM (WIRE) ×2 IMPLANT
WIRE MINI STICK MAX (SHEATH) ×2 IMPLANT

## 2016-12-27 NOTE — Discharge Instructions (Signed)
Angiogram, Care After °This sheet gives you information about how to care for yourself after your procedure. Your health care provider may also give you more specific instructions. If you have problems or questions, contact your health care provider. °What can I expect after the procedure? °After the procedure, it is common to have bruising and tenderness at the catheter insertion area. °Follow these instructions at home: °Insertion site care  °· Follow instructions from your health care provider about how to take care of your insertion site. Make sure you: °¨ Wash your hands with soap and water before you change your bandage (dressing). If soap and water are not available, use hand sanitizer. °¨ Change your dressing as told by your health care provider. °¨ Leave stitches (sutures), skin glue, or adhesive strips in place. These skin closures may need to stay in place for 2 weeks or longer. If adhesive strip edges start to loosen and curl up, you may trim the loose edges. Do not remove adhesive strips completely unless your health care provider tells you to do that. °· Do not take baths, swim, or use a hot tub until your health care provider approves. °· You may shower 24-48 hours after the procedure or as told by your health care provider. °¨ Gently wash the site with plain soap and water. °¨ Pat the area dry with a clean towel. °¨ Do not rub the site. This may cause bleeding. °· Do not apply powder or lotion to the site. Keep the site clean and dry. °· Check your insertion site every day for signs of infection. Check for: °¨ Redness, swelling, or pain. °¨ Fluid or blood. °¨ Warmth. °¨ Pus or a bad smell. °Activity  °· Rest as told by your health care provider, usually for 1-2 days. °· Do not lift anything that is heavier than 10 lbs. (4.5 kg) or as told by your health care provider. °· Do not drive for 24 hours if you were given a medicine to help you relax (sedative). °· Do not drive or use heavy machinery while  taking prescription pain medicine. °General instructions  °· Return to your normal activities as told by your health care provider, usually in about a week. Ask your health care provider what activities are safe for you. °· If the catheter site starts bleeding, lie flat and put pressure on the site. If the bleeding does not stop, get help right away. This is a medical emergency. °· Drink enough fluid to keep your urine clear or pale yellow. This helps flush the contrast dye from your body. °· Take over-the-counter and prescription medicines only as told by your health care provider. °· Keep all follow-up visits as told by your health care provider. This is important. °Contact a health care provider if: °· You have a fever or chills. °· You have redness, swelling, or pain around your insertion site. °· You have fluid or blood coming from your insertion site. °· The insertion site feels warm to the touch. °· You have pus or a bad smell coming from your insertion site. °· You have bruising around the insertion site. °· You notice blood collecting in the tissue around the catheter site (hematoma). The hematoma may be painful to the touch. °Get help right away if: °· You have severe pain at the catheter insertion area. °· The catheter insertion area swells very fast. °· The catheter insertion area is bleeding, and the bleeding does not stop when you hold steady pressure on   the area. °· The area near or just beyond the catheter insertion site becomes pale, cool, tingly, or numb. °These symptoms may represent a serious problem that is an emergency. Do not wait to see if the symptoms will go away. Get medical help right away. Call your local emergency services (911 in the [place] Do not drive yourself to the hospital. °Summary °· After the procedure, it is common to have bruising and tenderness at the catheter insertion area. °· After the procedure, it is important to rest and drink plenty of fluids. °· Do not take baths,  swim, or use a hot tub until your health care provider says it is okay to do so. You may shower 24-48 hours after the procedure or as told by your health care provider. °· If the catheter site starts bleeding, lie flat and put pressure on the site. If the bleeding does not stop, get help right away. This is a medical emergency. °This information is not intended to replace advice given to you by your health care provider. Make sure you discuss any questions you have with your health care provider. °Document Released: 04/29/2005 Document Revised: 09/15/2016 Document Reviewed: 09/15/2016 °Elsevier Interactive Patient Education © 2017 [employer] ° °

## 2016-12-27 NOTE — Progress Notes (Addendum)
Site area: LFA Site Prior to Removal:  Level 0 Pressure Applied For:20 min  Manual: yes   Patient Status During Pull:  stable Post Pull Site:  Level 0 Post Pull Instructions Given:  yes Post Pull Pulses Present: doppler Dressing Applied:  tegaderm Bedrest begins @ [id number] till 1320 Comments:by Ramond Marrow

## 2016-12-27 NOTE — Telephone Encounter (Signed)
-----   Message from Mena Goes, RN sent at 12/27/2016 10:08 AM EST ----- Regarding: 6 months   ----- Message ----- From: Angelia Mould, MD Sent: 12/27/2016   8:52 AM To: [facility] Charge Pool Subject: charge                                         PROCEDURE:  1. Ultrasound-guided access to the left common femoral artery 2. Aortogram with bilateral iliac arteriogram and bilateral lower extremity runoff  Also conscious sedation.  SURGEON: Judeth Cornfield. Scot Dock, MD, FACS  He will need a follow up visit in 6 months with ABIs. Thank you. CD

## 2016-12-27 NOTE — H&P (View-Only) (Signed)
Patient name: David Singh MRN: [id number] DOB: 1951-04-11 Sex: male  REASON FOR VISIT: Follow up of peripheral vascular disease. Referred by Dr. Lorelei Pont.  HPI: David Singh is a 66 y.o. male who I saw in consultation on 02/14/2015 with claudication. At that time he had an ABI of 86% on the right and 100% on the left. He did have a monophasic common femoral artery waveform in the right suggesting some inflow disease on the right. We discussed the importance of a structured walking program. I will order a follow up study in 6 months but have not seen him back since that time.  He continues to have right thigh hip and calf claudication which is brought on by ambulation and relieved with rest. He has minimal symptoms on the left side. His symptoms have gradually progressed over the last year. He denies any rest pain or history of nonhealing ulcers.  He has had previous coronary angioplasty and stenting in 2006 in [place] and is on Plavix.  He quit smoking in 1994.  Past Medical History:  Diagnosis Date  . Diabetes mellitus without complication ([place])   . GERD (gastroesophageal reflux disease)   . Hyperlipidemia   . Hypertension   . Peripheral vascular disease ([place])     Family History  Problem Relation Age of Onset  . Alcohol abuse Brother   . Cancer Brother   . Diabetes Brother   . Hyperlipidemia Brother   . Hypertension Brother   . Diabetes Mother   . Hypertension Mother   . Hyperlipidemia Mother   . Heart attack Mother   . Hyperlipidemia Father   . Hypertension Father     SOCIAL HISTORY: Social History  Substance Use Topics  . Smoking status: Former Smoker    Quit date: 1994  . Smokeless tobacco: Former Systems developer  . Alcohol use 0.0 oz/week     Comment: 2-8 times per year    Allergies  Allergen Reactions  . Dexilant [Dexlansoprazole]     Pressure in stomach and chest    Current Outpatient Prescriptions  Medication Sig Dispense Refill  . amLODipine (NORVASC) 10 MG  tablet Take 1 tablet (10 mg total) by mouth daily. 90 tablet 0  . aspirin 81 MG tablet Take 81 mg by mouth daily.    Marland Kitchen atorvastatin (LIPITOR) 80 MG tablet Take 1 tablet (80 mg total) by mouth daily. 90 tablet 0  . busPIRone (BUSPAR) 10 MG tablet Take 1 tablet (10 mg total) by mouth 2 (two) times daily. 60 tablet 3  . clopidogrel (PLAVIX) 75 MG tablet Take 1 tablet (75 mg total) by mouth daily. 90 tablet 3  . co-enzyme Q-10 30 MG capsule Take 30 mg by mouth 3 (three) times daily.    . Cyanocobalamin (VITAMIN B-12) 2500 MCG SUBL Place under the tongue.    . fenofibrate 160 MG tablet TAKE 1 TABLET(160 MG) BY MOUTH DAILY. 90 tablet 1  . finasteride (PROSCAR) 5 MG tablet Take 1 tablet (5 mg total) by mouth daily. 90 tablet 3  . Fish Oil-Cholecalciferol (FISH OIL + D3 PO) Take by mouth.    Marland Kitchen lisinopril (PRINIVIL,ZESTRIL) 40 MG tablet Take 1 tablet (40 mg total) by mouth daily. 90 tablet 1  . metFORMIN (GLUCOPHAGE) 1000 MG tablet Take 1 tablet (1,000 mg total) by mouth 2 (two) times daily with a meal. 180 tablet 0  . metoprolol succinate (TOPROL-XL) 100 MG 24 hr tablet Take 1 tablet (100 mg total) by mouth daily. Take with or immediately  following a meal. 90 tablet 3  . MULTIPLE VITAMIN PO Take by mouth.    . niacin 500 MG tablet Take 500 mg by mouth at bedtime.    . pantoprazole (PROTONIX) 40 MG tablet Take 1 tablet (40 mg total) by mouth daily. 180 tablet 1   No current facility-administered medications for this visit.     REVIEW OF SYSTEMS:  [X]  denotes positive finding, [ ]  denotes negative finding Cardiac  Comments:  Chest pain or chest pressure:    Shortness of breath upon exertion: X   Short of breath when lying flat:    Irregular heart rhythm:        Vascular    Pain in calf, thigh, or hip brought on by ambulation:    Pain in feet at night that wakes you up from your sleep:     Blood clot in your veins:    Leg swelling:         Pulmonary    Oxygen at home:    Productive cough:      Wheezing:         Neurologic    Sudden weakness in arms or legs:     Sudden numbness in arms or legs:     Sudden onset of difficulty speaking or slurred speech:    Temporary loss of vision in one eye:     Problems with dizziness:         Gastrointestinal    Blood in stool:     Vomited blood:         Genitourinary    Burning when urinating:     Blood in urine:        Psychiatric    Major depression:         Hematologic    Bleeding problems:    Problems with blood clotting too easily:        Skin    Rashes or ulcers:        Constitutional    Fever or chills:      PHYSICAL EXAM: Vitals:   12/22/16 1256  BP: (!) 148/94  Pulse: 75  Resp: 20  Temp: 98.2 F (36.8 C)  TempSrc: Oral  SpO2: 95%  Weight: 280 lb (127 kg)  Height: 5\' 9"  (1.753 m)    GENERAL: The patient is a well-nourished male, in no acute distress. The vital signs are documented above. CARDIAC: There is a regular rate and rhythm.  VASCULAR: I do not detect carotid bruits. Because of his weight, it is difficult to palpate femoral pulses. I cannot palpate pedal pulses on the right. He does have a palpable left posterior tibial pulse. He has mild bilateral lower extremity swelling. PULMONARY: There is good air exchange bilaterally without wheezing or rales. ABDOMEN: Soft and non-tender with normal pitched bowel sounds.  MUSCULOSKELETAL: There are no major deformities or cyanosis. NEUROLOGIC: No focal weakness or paresthesias are detected. SKIN: There are no ulcers or rashes noted. PSYCHIATRIC: The patient has a normal affect.  DATA:   BILATERAL LOWER EXTREMITY ARTERIAL DOPPLER STUDY: I have independently interpreted his bilateral lower extremity arterial Doppler study.  On the right side, which is the symptomatic side, he has a monophasic posterior tibial signal with a biphasic dorsalis pedis signal. ABI is 84% which is stable compared to one year ago. Toe pressures 80 mmHg.  On the left side, he  has an ABI of 100%. As a triphasic posterior tibial signal with a biphasic dorsalis pedis signal.  Toe pressure on the left is 96 mmHg.   MEDICAL ISSUES:  AORTOILIAC OCCLUSIVE DISEASE:  Although his Doppler study looks reasonable at rest, I suspect that he has right common iliac artery or external iliac artery disease which explains his symptoms. We've discussed conservative treatment versus arteriography and possible angioplasty and stenting. He states that since he moved here to [place] he has been anxious to do some hiking with his wife and therefore feels that this is significantly limiting him. He would like to proceed with arteriography and possible angioplasty and stenting of the right iliac artery. I will make further recommendations pending these results.  I have reviewed with the patient the indications for arteriography. In addition, I have reviewed the potential complications of arteriography including but not limited to: Bleeding, arterial injury, arterial thrombosis, dye action, renal insufficiency, or other unpredictable medical problems. I have explained to the patient that if we find disease amenable to angioplasty we could potentially address this at the same time. I have discussed the potential complications of angioplasty and stenting, including but not limited to: Bleeding, arterial thrombosis, arterial injury, dissection, or the need for surgical intervention.  HYPERTENSION: The patient's initial blood pressure today was elevated. We repeated this and this was still elevated. We have encouraged the patient to follow up with their primary care physician for management of their blood pressure.   Deitra Mayo [facility] [place] [phone]

## 2016-12-27 NOTE — Op Note (Signed)
PATIENT: David Singh   MRN: [id number] DOB: 1951-08-29    DATE OF PROCEDURE: 12/27/2016  INDICATIONS: Johnlee Matsuyama is a 66 y.o. male who presents with progressive right lower extremity claudication. He felt that his symptoms were limiting his lifestyle and he wished to pursue arteriography and possible angioplasty.  PROCEDURE:  1. Ultrasound-guided access to the left common femoral artery 2. Aortogram with bilateral iliac arteriogram and bilateral lower extremity runoff  SURGEON: Judeth Cornfield. Scot Dock, MD, FACS  ANESTHESIA: Local with sedation   EBL: Minimal  TECHNIQUE: The patient was brought to the peripheral vascular lab and was sedated. The conscious sedation was 57 minutes. He received 1 mg of Versed and 50 g of fentanyl.  During that time period, I was present face-to-face 100% of the time.  The patient's heart rate, blood pressure, and oxygen saturation were monitored by the nurse continuously during the procedure.  Both groins were prepped and draped in usual sterile fashion. Under ultrasound guidance, I cannulated the right common femoral artery but was unable to pass the wire on multiple attempts. I therefore elected to stick the left side. After the skin was anesthetized, under ultrasound guidance, the left common femoral artery was cannulated with a micropuncture needle a micropuncture sheath introduced over the wire. This was exchanged for a 5 [place] sheath over a [employer] wire. I was unable to advanced [facility] wire through the left common iliac artery. Ultimately I was able to navigate a large bulky calcific plaque at the aortic bifurcation using a Berenstein 2 catheter and an angled Glidewire. I then positioned a pigtail catheter above the renals and flush aortogram was obtained. The catheter was then positioned above the aortic bifurcation and oblique iliac projections were obtained. Bilateral lower extremity runoff films were then obtained. The patient was then transferred to  the holding area for removal of the sheet. No immediate complications were noted.  FINDINGS:  1. There are single renal arteries bilaterally with no significant renal artery stenosis identified. 2. The infrarenal aorta is widely patent but there is a large bulky calcific plaque at the aortic bifurcation with occlusion of the right common iliac artery and significant plaque in the proximal left common iliac artery. The IMA is markedly enlarged and providing collateral flow around this blockage. 3. On the right side, which is the symptomatic side, the right common iliac artery is occluded. There is reconstitution of the distal right common iliac artery. The external iliac artery and hypogastric artery are patent. The common femoral, superficial femoral, deep femoral and popliteal arteries are patent. The anterior tibial artery is patent on the right. The tibial peroneal trunk, proximal peroneal, and proximal posterior tibial arteries are patent. These appear to be occluded distally although because of the proximal occlusion it difficult to get adequate visualization distally in the right leg. 4. On the left side, the proximal common iliac artery has a calcific plaque with moderate narrowing. The hypogastric artery and external iliac arteries are patent. There is some calcific disease in the left common femoral artery. The deep femoral superficial femoral, popliteal, and posterior tibial arteries are patent. There is poor visualization of the left anterior tibial and peroneal arteries on the left which may be occluded.  CLINICAL NOTE: This patient is obese and is not a good candidate for aortofemoral bypass grafting. If his symptoms progressed significantly, consideration could be given to iliac angioplasty which would be associated with significant risk given the calcific bulky nature of the plaque. Another consideration would be  angioplasty and stenting of the left iliac system with a left-to-right  femorofemoral bypass. However given his weight and the complex of the lesion I would favor conservative treatment if at all possible. I have discussed this with him.   Deitra Mayo, MD, FACS [facility]  DATE OF DICTATION:   12/27/2016

## 2016-12-27 NOTE — Interval H&P Note (Signed)
History and Physical Interval Note:  12/27/2016 7:31 AM  David Singh  has presented today for surgery, with the diagnosis of pvd w/right leg claudication  The various methods of treatment have been discussed with the patient and family. After consideration of risks, benefits and other options for treatment, the patient has consented to  Procedure(s): Abdominal Aortogram w/Lower Extremity (N/A) as a surgical intervention .  The patient's history has been reviewed, patient examined, no change in status, stable for surgery.  I have reviewed the patient's chart and labs.  Questions were answered to the patient's satisfaction.     Deitra Mayo

## 2016-12-27 NOTE — Telephone Encounter (Signed)
LVM and mailed lttr for f/u appt 9/5 [place] 11, OV 11:30

## 2017-01-10 ENCOUNTER — Encounter: Payer: Self-pay | Admitting: Family Medicine

## 2017-01-10 ENCOUNTER — Ambulatory Visit (INDEPENDENT_AMBULATORY_CARE_PROVIDER_SITE_OTHER): Payer: Medicare Other | Admitting: Family Medicine

## 2017-01-10 VITALS — BP 140/80 | HR 72 | Temp 98.2°F | Ht 69.0 in | Wt 277.6 lb

## 2017-01-10 DIAGNOSIS — E785 Hyperlipidemia, unspecified: Secondary | ICD-10-CM

## 2017-01-10 DIAGNOSIS — E119 Type 2 diabetes mellitus without complications: Secondary | ICD-10-CM

## 2017-01-10 DIAGNOSIS — G8929 Other chronic pain: Secondary | ICD-10-CM | POA: Diagnosis not present

## 2017-01-10 DIAGNOSIS — M25561 Pain in right knee: Secondary | ICD-10-CM

## 2017-01-10 DIAGNOSIS — Z23 Encounter for immunization: Secondary | ICD-10-CM

## 2017-01-10 DIAGNOSIS — I10 Essential (primary) hypertension: Secondary | ICD-10-CM | POA: Diagnosis not present

## 2017-01-10 NOTE — Patient Instructions (Addendum)
I will ask our referrals dept to reschedule your dermatology appt.  We will set you up to see orthopedics about your knee- in the meantime a knee brace is a good idea Continue to work on your diet and weight loss!  You have an umbilical hernia as you know.  I cannot feel a definite hernia in the groin, but this does not mean that you could not have a small hernia. If you develop any acute or persistent pain please seek emergency care.  Otherwise if this gets to be more bothersome we can have general surgery see you for possible operative repair   Please see me in 3-4 months for a recheck and take care!

## 2017-01-10 NOTE — Progress Notes (Signed)
[facility] [address], [place], [place] [address] [phone]  Date:  01/10/2017   Name:  David Singh   DOB:  1951/10/15   MRN:  [id number]  PCP:  Lamar Blinks, MD    Chief Complaint: Follow-up (Hernia, R knee pain,cardiology)   History of Present Illness:  David Singh is a 66 y.o. very pleasant male patient who presents with the following:  Here today to follow-up on HTN, DM2, dyslipidemia.  HTN - on Amlodipine 10 mg, Lisinopril 40 mg, Metoprolol XL 100 mg Hyperlipidemia - on Atorvastatin 80 mg, Fish oil Lab Results  Component Value Date   CHOL 180 12/09/2016   HDL 38.10 (L) 12/09/2016   LDLCALC 71 03/31/2016   LDLDIRECT 94.0 12/09/2016   TRIG 330.0 (H) 12/09/2016   CHOLHDL 5 12/09/2016   DM2 - on Metformin 1000 mg BID.   Lab Results  Component Value Date   HGBA1C 7.0 (H) 12/09/2016     Had full labs completed on 12-09-2016.  These labs have been reviewed.  Follow by Dr Scot Dock ([facility]).  On 12-27-2016 had Aortogram with bilateral iliac arteriogram and bilateral lower extremity runoff.  Has follow-up in September 2018.  Has Cardiologist appointment in 1-2 weeks.  Is going to talk to them about "nuclear treadmill" test.    Has not been checking his B/P at home.  Feels like that his blood pressure been running high lately.  Denies any feelings of hypotension.  Feels like he is hypoglycemic once every couple of months, that he attributes to not eating.  Normal CBG is 120-130.  He has not had any severe hypoglycemic episodes and keeps frosting with him to eat in an emergency  Is working on changing his diet.  Eating more vegetables.  Eating less fried foods and more lean meats.   Plans to start walking/exercising.  Had meniscal repair on right knee (2006).  Is having issues with this knee again however. It started bothering him again .  Has had some occasional  abdominal pain, which he thinks is a hernia.  Has umbulical hernia for 4 years, which is protruding more in the last couple of months.  Was doing some heavy lifting this past Saturday. Has taken Tylenol PM, which has helped.  He was supposed to be seen at [facility] dermatology but had to cancel due to illness - he wanted to get a skin check. He has not been able to get his appt rescheduled as of yet despite several phone calls   Admission on 12/27/2016, Discharged on 12/27/2016  Component Date Value Ref Range Status  . Sodium 12/27/2016 141  135 - 145 mmol/L Final  . Potassium 12/27/2016 4.0  3.5 - 5.1 mmol/L Final  . Chloride 12/27/2016 103  101 - 111 mmol/L Final  . BUN 12/27/2016 9  6 - 20 mg/dL Final  . Creatinine, Ser 12/27/2016 0.80  0.61 - 1.24 mg/dL Final  . Glucose, Bld 12/27/2016 156* 65 - 99 mg/dL Final  . Calcium, Ion 12/27/2016 1.26  1.15 - 1.40 mmol/L Final  . TCO2 12/27/2016 26  0 - 100 mmol/L Final  . Hemoglobin 12/27/2016 13.6  13.0 - 17.0 g/dL Final  . HCT 12/27/2016 40.0  39.0 - 52.0 % Final  . Glucose-Capillary 12/27/2016 121* 65 - 99 mg/dL Final     Patient Active Problem List   Diagnosis Date Noted  . Chronic gout 03/09/2016  .  Hypertriglyceridemia 01/16/2015  . Type 2 diabetes mellitus ([place]) 01/06/2015  . CAD in native artery 01/06/2015  . History of coronary artery stent placement 01/06/2015  . PAD (peripheral artery disease) ([place]) 01/06/2015  . Hyperlipidemia 01/06/2015  . Essential hypertension 01/06/2015    Past Medical History:  Diagnosis Date  . Diabetes mellitus without complication ([place])   . GERD (gastroesophageal reflux disease)   . Hyperlipidemia   . Hypertension   . Peripheral vascular disease [facility])     Past Surgical History:  Procedure Laterality Date  . ABDOMINAL AORTOGRAM W/LOWER EXTREMITY N/A 12/27/2016   Procedure: Abdominal Aortogram w/Lower Extremity;  Surgeon: Angelia Mould, MD;  Location: [place] CV LAB;  Service:  Cardiovascular;  Laterality: N/A;  . CARDIAC SURGERY    . CORONARY ANGIOPLASTY WITH STENT PLACEMENT    . MENISCUS REPAIR Right   . NASAL SINUS SURGERY    . TONSILLECTOMY      Social History  Substance Use Topics  . Smoking status: Former Smoker    Quit date: 1994  . Smokeless tobacco: Former Systems developer  . Alcohol use 0.0 oz/week     Comment: 2-8 times per year    Family History  Problem Relation Age of Onset  . Alcohol abuse Brother   . Cancer Brother   . Diabetes Brother   . Hyperlipidemia Brother   . Hypertension Brother   . Diabetes Mother   . Hypertension Mother   . Hyperlipidemia Mother   . Heart attack Mother   . Hyperlipidemia Father   . Hypertension Father     Allergies  Allergen Reactions  . Dexilant [Dexlansoprazole]     Pressure in stomach and chest    Medication list has been reviewed and updated.  Current Outpatient Prescriptions on File Prior to Visit  Medication Sig Dispense Refill  . amLODipine (NORVASC) 10 MG tablet Take 1 tablet (10 mg total) by mouth daily. 90 tablet 0  . aspirin 81 MG tablet Take 81 mg by mouth daily.    Marland Kitchen atorvastatin (LIPITOR) 80 MG tablet Take 1 tablet (80 mg total) by mouth daily. 90 tablet 0  . B Complex-C (SUPER B COMPLEX PO) Take 1 tablet by mouth daily.    . busPIRone (BUSPAR) 10 MG tablet Take 1 tablet (10 mg total) by mouth 2 (two) times daily. 60 tablet 3  . Cholecalciferol (VITAMIN D) 2000 units tablet Take 2,000 Units by mouth daily.    . clopidogrel (PLAVIX) 75 MG tablet Take 1 tablet (75 mg total) by mouth daily. 90 tablet 3  . Coenzyme Q10 300 MG CAPS Take 300 mg by mouth daily.     . Cyanocobalamin (VITAMIN B-12) 5000 MCG SUBL Place 1 tablet under the tongue daily.     . fenofibrate 160 MG tablet TAKE 1 TABLET(160 MG) BY MOUTH DAILY. 90 tablet 1  . finasteride (PROSCAR) 5 MG tablet Take 1 tablet (5 mg total) by mouth daily. 90 tablet 3  . lisinopril (PRINIVIL,ZESTRIL) 40 MG tablet Take 1 tablet (40 mg total) by mouth  daily. 90 tablet 1  . metFORMIN (GLUCOPHAGE) 1000 MG tablet Take 1 tablet (1,000 mg total) by mouth 2 (two) times daily with a meal. 180 tablet 0  . metoprolol succinate (TOPROL-XL) 100 MG 24 hr tablet Take 1 tablet (100 mg total) by mouth daily. Take with or immediately following a meal. 90 tablet 3  . MULTIPLE VITAMIN PO Take 1 tablet by mouth daily.     . niacin 500 MG  tablet Take 500 mg by mouth at bedtime.    . Omega-3 Fatty Acids (EQL OMEGA 3 FISH OIL) 1000 MG CAPS Take 1 capsule by mouth 2 (two) times daily.    . pantoprazole (PROTONIX) 40 MG tablet Take 1 tablet (40 mg total) by mouth daily. 180 tablet 1   No current facility-administered medications on file prior to visit.     Review of Systems:  As per HPI- otherwise negative.   Physical Examination: Vitals:   01/10/17 0904  BP: 140/80  Pulse: 72  Temp: 98.2 F (36.8 C)   Vitals:   01/10/17 0904  Weight: 277 lb 9.6 oz (125.9 kg)  Height: 5\' 9"  (1.753 m)   Body mass index is 40.99 kg/m. Ideal Body Weight: Weight in (lb) to have BMI = 25: 168.9  GEN: WDWN, NAD, Non-toxic, A & O x 3, obese, otherwise looks well HEENT: Atraumatic, Normocephalic. Neck supple. No masses, No LAD. Ears and Nose: No external deformity. CV: RRR, No M/G/R. No JVD. No thrill. No extra heart sounds. PULM: CTA B, no wheezes, crackles, rhonchi. No retractions. No resp. distress. No accessory muscle use. ABD: S, NT, ND, +BS. No rebound. No HSM. EXTR: No c/c/e NEURO Normal gait.  PSYCH: Normally interactive. Conversant. Not depressed or anxious appearing.  Calm demeanor.  Right knee: He does have an umbilical hernia which I cannot fully reduce. No palpable inguinal hernia.   Right knee:  He notes pain on the medial joint line.  Joint is stable, no effusion or redness, no crepitus noted   Assessment and Plan:  Dyslipidemia  Essential hypertension  Type 2 diabetes mellitus without complication, without long-term current use of insulin  (HCC)  Immunization due - Plan: Pneumococcal conjugate vaccine 13-valent IM, CANCELED: Pneumococcal conjugate vaccine 13-valent IM  Chronic pain of right knee - Plan: Ambulatory referral to Orthopedic Surgery  Message to referral staff about his derm appt Ortho referral for his knee Discussed hernia and anticipatory guidance regarding this issue Recent A1c ok- continue current medications  BP is under control  His triglyceriedes are still high but we are treating him with high dose statin already  Signed Lamar Blinks, MD

## 2017-01-14 NOTE — Progress Notes (Signed)
HPI: 66 yo male for evaluation of CAD. Patient apparently had PCI in [place] 10 years ago. No records available. Patient is followed by vascular surgery for peripheral vascular disease. Angiogram March 2018 showed no renal artery stenosis. 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 There was reconstitution of the distal right common iliac artery it was felt medical therapy indicated. Patient denies dyspnea, chest pain, palpitations or syncope. He has bilateral lower extremity claudication after walking approximately 25 yards right greater than left.  Current Outpatient Prescriptions  Medication Sig Dispense Refill  . amLODipine (NORVASC) 10 MG tablet Take 1 tablet (10 mg total) by mouth daily. 90 tablet 0  . aspirin 81 MG tablet Take 81 mg by mouth daily.    Marland Kitchen atorvastatin (LIPITOR) 80 MG tablet Take 1 tablet (80 mg total) by mouth daily. 90 tablet 0  . Blood Glucose Monitoring Suppl (FREESTYLE FREEDOM LITE) w/Device KIT Check blood sugar once daily 1 each 0  . busPIRone (BUSPAR) 10 MG tablet Take 1 tablet (10 mg total) by mouth 2 (two) times daily. 60 tablet 3  . Cholecalciferol (VITAMIN D3) 2000 units TABS Take 2,000 Units by mouth daily.    . clopidogrel (PLAVIX) 75 MG tablet Take 1 tablet (75 mg total) by mouth daily. 90 tablet 3  . Coenzyme Q10 300 MG CAPS Take 300 mg by mouth daily.     . Cyanocobalamin (VITAMIN B-12 PO) Take 2,500 mg by mouth daily.    . fenofibrate 160 MG tablet TAKE 1 TABLET(160 MG) BY MOUTH DAILY. 90 tablet 1  . finasteride (PROSCAR) 5 MG tablet Take 1 tablet (5 mg total) by mouth daily. 90 tablet 3  . glucose blood (FREESTYLE LITE) test strip Check blood sugar once daily 100 each 12  . Lancets (FREESTYLE) lancets Check blood sugar once daily 100 each 12  . lisinopril (PRINIVIL,ZESTRIL) 40 MG tablet Take 1 tablet (40 mg total) by mouth daily. 90 tablet 1  . metFORMIN (GLUCOPHAGE) 1000 MG tablet Take  1 tablet (1,000 mg total) by mouth 2 (two) times daily with a meal. 180 tablet 0  . metoprolol succinate (TOPROL-XL) 100 MG 24 hr tablet Take 1 tablet (100 mg total) by mouth daily. Take with or immediately following a meal. 90 tablet 3  . MULTIPLE VITAMIN PO Take 1 tablet by mouth daily.     . niacin 500 MG tablet Take 500 mg by mouth at bedtime.    . Omega-3 Fatty Acids (EQL OMEGA 3 FISH OIL) 1000 MG CAPS Take 1 capsule by mouth 2 (two) times daily.    . pantoprazole (PROTONIX) 40 MG tablet Take 1 tablet (40 mg total) by mouth daily. 180 tablet 1   No current facility-administered medications for this visit.     Allergies  Allergen Reactions  . Dexilant [Dexlansoprazole]     Pressure in stomach and chest     Past Medical History:  Diagnosis Date  . CAD (coronary artery disease)   . Diabetes mellitus without complication ([place])   . GERD (gastroesophageal reflux disease)   . Hyperlipidemia   . Hypertension   . Peripheral vascular disease [facility])     Past Surgical History:  Procedure Laterality Date  . ABDOMINAL AORTOGRAM W/LOWER EXTREMITY N/A 12/27/2016   Procedure: Abdominal Aortogram w/Lower Extremity;  Surgeon: Angelia Mould, MD;  Location: [place] CV LAB;  Service: Cardiovascular;  Laterality: N/A;  . CARDIAC SURGERY    .  CORONARY ANGIOPLASTY WITH STENT PLACEMENT    . MENISCUS REPAIR Right   . NASAL SINUS SURGERY    . TONSILLECTOMY      Social History   Social History  . Marital status: Married    Spouse name: N/A  . Number of children: 2  . Years of education: N/A   Occupational History  . Not on file.   Social History Main Topics  . Smoking status: Former Smoker    Quit date: 1994  . Smokeless tobacco: Former Systems developer  . Alcohol use 0.0 oz/week     Comment: 2-8 times per year  . Drug use: No  . Sexual activity: Not on file   Other Topics Concern  . Not on file   Social History Narrative  . No narrative on file    Family History  Problem  Relation Age of Onset  . Alcohol abuse Brother   . Cancer Brother   . Diabetes Brother   . Hyperlipidemia Brother   . Hypertension Brother   . Diabetes Mother   . Hypertension Mother   . Hyperlipidemia Mother   . Heart attack Mother   . Hyperlipidemia Father   . Hypertension Father     ROS: no fevers or chills, productive cough, hemoptysis, dysphasia, odynophagia, melena, hematochezia, dysuria, hematuria, rash, seizure activity, orthopnea, PND, pedal edema. Remaining systems are negative.  Physical Exam:   Blood pressure 130/82, pulse 75, height '5\' 9"'  (1.753 m), weight 277 lb 12.8 oz (126 kg).  General:  Well developed/obese in NAD Skin warm/dry Patient not depressed No peripheral clubbing Back-normal HEENT-normal/normal eyelids Neck supple/normal carotid upstroke bilaterally; no bruits; no JVD; no thyromegaly chest - CTA/ normal expansion CV - RRR/normal S1 and S2; no murmurs, rubs or gallops;  PMI nondisplaced Abdomen -NT/ND, no HSM, no mass, + bowel sounds, no bruit 2+ femoral pulses, no bruits Ext-no edema, chords; diminished distal pulses Neuro-grossly nonfocal  ECG - sinus rhythm at a rate of 75. No ST changes. personally reviewed  A/P  1 coronary artery disease-plan to continue aspirin but discontinue Plavix as it has been 9 years since his previous stent. Continue statin. Schedule Lexiscan nuclear study for risk stratification as he is contemplating initiating an exercise program. Obtain records from [place].  2 hyperlipidemia-recent LDL 94. Continue Lipitor 80 mg daily and add Zetia 10 mg daily. Check lipids and liver in 6 weeks.  3 hypertension-blood pressure controlled. Continue present medications.  4 peripheral vascular disease-continue aspirin and statin. Followed by vascular surgery and medical therapy planned at this point.  5 obesity-we discussed the importance of weight loss and he is initiating an exercise program.  6 diabetes mellitus-management per  primary care.  Kirk Ruths, MD

## 2017-01-18 ENCOUNTER — Encounter: Payer: Self-pay | Admitting: Family Medicine

## 2017-01-19 ENCOUNTER — Ambulatory Visit (INDEPENDENT_AMBULATORY_CARE_PROVIDER_SITE_OTHER): Payer: Medicare Other | Admitting: Cardiology

## 2017-01-19 ENCOUNTER — Encounter: Payer: Self-pay | Admitting: Cardiology

## 2017-01-19 VITALS — BP 130/82 | HR 75 | Ht 69.0 in | Wt 277.8 lb

## 2017-01-19 DIAGNOSIS — I2510 Atherosclerotic heart disease of native coronary artery without angina pectoris: Secondary | ICD-10-CM | POA: Diagnosis not present

## 2017-01-19 DIAGNOSIS — I10 Essential (primary) hypertension: Secondary | ICD-10-CM | POA: Diagnosis not present

## 2017-01-19 DIAGNOSIS — E7800 Pure hypercholesterolemia, unspecified: Secondary | ICD-10-CM | POA: Diagnosis not present

## 2017-01-19 MED ORDER — GLUCOSE BLOOD VI STRP: ORAL_STRIP | 12 refills | Status: DC

## 2017-01-19 MED ORDER — FREESTYLE LANCETS MISC: 12 refills | Status: DC

## 2017-01-19 MED ORDER — EZETIMIBE 10 MG PO TABS: 10.0000 mg | ORAL_TABLET | Freq: Every day | ORAL | 3 refills | Status: DC

## 2017-01-19 MED ORDER — FREESTYLE FREEDOM LITE W/DEVICE KIT: PACK | 0 refills | Status: DC

## 2017-01-19 NOTE — Patient Instructions (Signed)
Medication Instructions:   STOP PLAVIX  START ZETIA 10 MG ONCE DAILY  Labwork:  Your physician recommends that you return for lab work in: [place] OFFICE= 3RD FLOOR= DO NOT EAT PRIOR TO LAB WORK  Testing/Procedures:  Your physician has requested that you have a lexiscan myoview. For further information please visit [email or web address]. Please follow instruction sheet, as given.    Follow-Up:  Your physician wants you to follow-up in: [place] will receive a reminder letter in the mail two months in advance. If you don't receive a letter, please call our office to schedule the follow-up appointment.   If you need a refill on your cardiac medications before your next appointment, please call your pharmacy.

## 2017-01-26 DIAGNOSIS — L821 Other seborrheic keratosis: Secondary | ICD-10-CM | POA: Diagnosis not present

## 2017-01-26 DIAGNOSIS — D235 Other benign neoplasm of skin of trunk: Secondary | ICD-10-CM | POA: Diagnosis not present

## 2017-01-26 DIAGNOSIS — L918 Other hypertrophic disorders of the skin: Secondary | ICD-10-CM | POA: Diagnosis not present

## 2017-01-26 DIAGNOSIS — D225 Melanocytic nevi of trunk: Secondary | ICD-10-CM | POA: Diagnosis not present

## 2017-01-27 ENCOUNTER — Telehealth (HOSPITAL_COMMUNITY): Payer: Self-pay

## 2017-01-27 NOTE — Telephone Encounter (Signed)
Encounter Complete.  

## 2017-02-01 ENCOUNTER — Ambulatory Visit (HOSPITAL_COMMUNITY)
Admission: RE | Admit: 2017-02-01 | Discharge: 2017-02-01 | Disposition: A | Discharge status: Home / Self Care | Payer: Medicare Other | Source: Ambulatory Visit | Attending: Cardiovascular Disease | Admitting: Cardiovascular Disease

## 2017-02-01 DIAGNOSIS — I2510 Atherosclerotic heart disease of native coronary artery without angina pectoris: Secondary | ICD-10-CM | POA: Diagnosis not present

## 2017-02-01 LAB — MYOCARDIAL PERFUSION IMAGING
CHL CUP NUCLEAR SRS: 0
LV dias vol: 140 mL (ref 62–150)
LVSYSVOL: 64 mL
Peak HR: 103 {beats}/min
Rest HR: 77 {beats}/min
SDS: 0
SSS: 0
TID: 1.15

## 2017-02-01 MED ORDER — REGADENOSON 0.4 MG/5ML IV SOLN
0.4000 mg | Freq: Once | INTRAVENOUS | Status: AC
  Administered 2017-02-01: 0.4 mg via INTRAVENOUS

## 2017-02-01 MED ORDER — TECHNETIUM TC 99M TETROFOSMIN IV KIT
10.0000 | PACK | Freq: Once | INTRAVENOUS | Status: AC | PRN
  Administered 2017-02-01: 10 via INTRAVENOUS
  Filled 2017-02-01: qty 10

## 2017-02-01 MED ORDER — TECHNETIUM TC 99M TETROFOSMIN IV KIT
31.4000 | PACK | Freq: Once | INTRAVENOUS | Status: AC | PRN
  Administered 2017-02-01: 31.4 via INTRAVENOUS
  Filled 2017-02-01: qty 32

## 2017-02-07 ENCOUNTER — Encounter (INDEPENDENT_AMBULATORY_CARE_PROVIDER_SITE_OTHER): Payer: Self-pay | Admitting: Orthopaedic Surgery

## 2017-02-07 ENCOUNTER — Ambulatory Visit (INDEPENDENT_AMBULATORY_CARE_PROVIDER_SITE_OTHER): Payer: Medicare Other

## 2017-02-07 ENCOUNTER — Ambulatory Visit (INDEPENDENT_AMBULATORY_CARE_PROVIDER_SITE_OTHER): Payer: Medicare Other | Admitting: Orthopaedic Surgery

## 2017-02-07 ENCOUNTER — Encounter: Payer: Self-pay | Admitting: Cardiology

## 2017-02-07 VITALS — BP 141/76 | HR 77 | Ht 69.0 in | Wt 275.0 lb

## 2017-02-07 DIAGNOSIS — G8929 Other chronic pain: Secondary | ICD-10-CM | POA: Diagnosis not present

## 2017-02-07 DIAGNOSIS — I2510 Atherosclerotic heart disease of native coronary artery without angina pectoris: Secondary | ICD-10-CM | POA: Diagnosis not present

## 2017-02-07 DIAGNOSIS — M25561 Pain in right knee: Secondary | ICD-10-CM

## 2017-02-07 NOTE — Progress Notes (Signed)
Office Visit Note   Patient: David Singh           Date of Birth: [birth year]           MRN: [id number] Visit Date: 02/07/2017              Requested by: Darreld Mclean, MD [address] [place], [place] [address] PCP: Lamar Blinks, MD   Assessment & Plan: Visit Diagnoses:  1. Chronic pain of right knee   Tricompartmental osteoarthritis right knee with predominant changes in the medial compartment  Plan: Long discussion over 30-40 minutes regarding her diagnosis and different treatment options including cortisone injection Visco supplementation and even knee replacement. I do not think he is a candidate for that at this point as other options have not been explored. This is all complicated by the fact that he has vascular claudication with partial occlusion of the common iliac vessels. He is being followed by the vascular surgery service. To continue wearing his brace and taking over-the-counter medicines and reserve injections future consideration.  Follow-Up Instructions: Return if symptoms worsen or fail to improve.   Orders:  Orders Placed This Encounter  Procedures  . XR KNEE 3 VIEW RIGHT  . XR HIP UNILAT W OR W/O PELVIS 1V RIGHT   No orders of the defined types were placed in this encounter.     Procedures: No procedures performed   Clinical Data: No additional findings.   Subjective: Chief Complaint  Patient presents with  . Right Knee - Pain    David. Singh is a 66 year old male that presents with Right knee pain x 25 years. He relates a meniscus repair in 2006 in [place] of the Right knee. He had an illness as a child (can't remember) that kept him out of [employer]. Saw Dr. Rachelle Hora (cardio) and has  David Blank is accompanied by his wife and seen for evaluation of chronic right knee pain. As mentioned above he's had trouble with his knee off and on for many years. While living in [place] he underwent an arthroscopic medial meniscectomy. He  has been diagnosed with coronary artery disease and peripheral vascular disease. A stent was attempted into the common iliac vessels but apparently was unsuccessful. He presently is just taking aspirin. He does have significant claudication and is limited in his ability to ambulate beyond about 25 yards. Specifically notes a difference between his claudication pain and the pain along the medial compartment of his right knee. No injury or trauma.  HPI  Review of Systems   Objective: Vital Signs: BP (!) 141/76   Pulse 77   Ht 5\' 9"  (1.753 m)   Wt 275 lb (124.7 kg)   BMI 40.61 kg/m   Physical Exam  Ortho Exam right knee exam without evidence of effusion. Full extension. Minimal patellar crepitation. No pain laterally. Increased varus with weightbearing associated with medial joint pain. No popping or clicking. No calf discomfort. No pulses identified at the ankle. Skin intact. Normal feeling.  Specialty Comments:  No specialty comments available.  Imaging: Xr Hip Unilat W Or W/o Pelvis 1v Right  Result Date: 02/07/2017 Film of the pelvis was  taken in the AP projection. Diffuse calcification of femoral arteries bilaterally. Hip joint spaces are maintained. Osteitis pubis identified. Some sclerosis about both greater trochanters. No evidence of fracture.  Xr Knee 3 View Right  Result Date: 02/07/2017 Films of the right knee are obtained in 3 projections standing. There is  considerable decrease in the medial joint space with about 1-2 of varus. Subchondral sclerosis is identified in the medial tibia and femur associated with peripheral osteophytes. No ectopic calcification. Minimal degenerative changes at the patellofemoral joint and lateral compartment.    PMFS History: Patient Active Problem List   Diagnosis Date Noted  . Chronic gout 03/09/2016  . Hypertriglyceridemia 01/16/2015  . Type 2 diabetes mellitus ([place]) 01/06/2015  . CAD in native artery 01/06/2015  . History of  coronary artery stent placement 01/06/2015  . PAD (peripheral artery disease) ([place]) 01/06/2015  . Hyperlipidemia 01/06/2015  . Essential hypertension 01/06/2015   Past Medical History:  Diagnosis Date  . CAD (coronary artery disease)   . Diabetes mellitus without complication ([place])   . GERD (gastroesophageal reflux disease)   . Hyperlipidemia   . Hypertension   . Peripheral vascular disease ([place])     Family History  Problem Relation Age of Onset  . Alcohol abuse Brother   . Cancer Brother   . Diabetes Brother   . Hyperlipidemia Brother   . Hypertension Brother   . Diabetes Mother   . Hypertension Mother   . Hyperlipidemia Mother   . Heart attack Mother   . Hyperlipidemia Father   . Hypertension Father     Past Surgical History:  Procedure Laterality Date  . ABDOMINAL AORTOGRAM W/LOWER EXTREMITY N/A 12/27/2016   Procedure: Abdominal Aortogram w/Lower Extremity;  Surgeon: Angelia Mould, MD;  Location: [facility];  Service: Cardiovascular;  Laterality: N/A;  . CARDIAC SURGERY    . CORONARY ANGIOPLASTY WITH STENT PLACEMENT    . MENISCUS REPAIR Right   . NASAL SINUS SURGERY    . TONSILLECTOMY     Social History   Occupational History  . Not on file.   Social History Main Topics  . Smoking status: Former Smoker    Quit date: 1994  . Smokeless tobacco: Former Systems developer  . Alcohol use 0.0 oz/week     Comment: 2-8 times per year  . Drug use: No  . Sexual activity: Not on file

## 2017-02-09 ENCOUNTER — Telehealth: Payer: Self-pay | Admitting: Cardiology

## 2017-02-09 NOTE — Telephone Encounter (Signed)
Faxed Release signed by patient to Dr Fransisco Beau [facility] to obtain records per Dr Stanford Breed.

## 2017-02-11 ENCOUNTER — Telehealth: Payer: Self-pay | Admitting: Cardiology

## 2017-02-11 NOTE — Telephone Encounter (Signed)
Received records from [facility] as requested by Dr Stanford Breed.  Records given to Dr Stanford Breed to review. lp

## 2017-03-14 ENCOUNTER — Ambulatory Visit: Payer: Medicare Other | Admitting: Family Medicine

## 2017-03-29 ENCOUNTER — Encounter: Payer: Self-pay | Admitting: *Deleted

## 2017-04-16 ENCOUNTER — Encounter: Payer: Self-pay | Admitting: Family Medicine

## 2017-04-26 ENCOUNTER — Other Ambulatory Visit: Payer: Self-pay | Admitting: Medical

## 2017-04-26 DIAGNOSIS — I10 Essential (primary) hypertension: Secondary | ICD-10-CM

## 2017-04-29 ENCOUNTER — Other Ambulatory Visit: Payer: Self-pay | Admitting: Medical

## 2017-05-02 NOTE — Progress Notes (Addendum)
David Singh at [facility] [address] [place], [place] [address] [phone]  Date:  05/04/2017   Name:  David Singh   DOB:  [birth year]   MRN:  [id number]  PCP:  David Mclean, MD    Chief Complaint: Follow-up (Pt here for 3-4 month f/u visit. c/o having trouble remembering things noticed for a couple of months. )   History of Present Illness:  David Singh is a 66 y.o. very pleasant male patient who presents with the following:  Last seen here in March of this year History of DM, CAD/ stent, PAD, hyperlipidemia, HTN Lab Results  Component Value Date   HGBA1C 7.0 (H) 12/09/2016   Needs an A1c today Foot exam needed Eye exam:  He is due, he has a doctor who he can see.  He is waiting for funds No chest pain or SOB He is able to climb stairs at home without getting winded at all, no CP with exercise   Per cardiology note from March:  HPI: 66 yo male for evaluation of CAD. Patient apparently had PCI in [place] 10 years ago. No records available. Patient is followed by vascular surgery for peripheral vascular disease. Angiogram March 2018 showed no renal artery stenosis. 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 There was reconstitution of the distal right common iliac artery it was felt medical therapy indicated. Patient denies dyspnea, chest pain, palpitations or syncope. He has bilateral lower extremity claudication after walking approximately 25 yards right greater than left.  He did a stress in April:  The left ventricular ejection fraction is mildly decreased (45-54%).  Nuclear stress EF: 54%.  There was no ST segment deviation noted during stress.  The study is normal.   Normal stress nuclear study with no ischemia or infarction; EF 54 with normal wall motion; mild LVE.  He had sent me an email about 3 weeks ago with concern about his blood  sugar being too high.  However things seem to have stabilized again since then.  We will get his A1c for him today  Lab Results  Component Value Date   HGBA1C 7.0 (H) 12/09/2016   He has also seen vascular surgery about his significant claudication. So far they have shied away from surgery due to risks.  His claudication really limits his exercise ability and enjoyment of life.  He may be getting to the point where surgery is necessary - he will see vascular again in September to re-assess   BP Readings from Last 3 Encounters:  05/04/17 (!) 146/82  02/07/17 (!) 141/76  01/19/17 130/82   He is trying to lose weight but it does not seem to be working.  He cannot really exercise however due to his legs  Wt Readings from Last 3 Encounters:  05/04/17 283 lb (128.4 kg)  02/07/17 275 lb (124.7 kg)  02/01/17 277 lb (125.6 kg)   He has OA in his knees and is thinking about seeing ortho for this - may be able to have some injections to improve his joint function  He and his wife have noticed some mild memory difficulty- he may forget names, etc.  Nothing more serious such as getting lost in a familiar area.  Offered to have him evaluated by neurology- he prefers to continue observing for now but will let me know if their concerns increase   Patient Active  Problem List   Diagnosis Date Noted  . Chronic gout 03/09/2016  . Hypertriglyceridemia 01/16/2015  . Type 2 diabetes mellitus ([place]) 01/06/2015  . CAD in native artery 01/06/2015  . History of coronary artery stent placement 01/06/2015  . PAD (peripheral artery disease) ([place]) 01/06/2015  . Hyperlipidemia 01/06/2015  . Essential hypertension 01/06/2015    Past Medical History:  Diagnosis Date  . CAD (coronary artery disease)   . Diabetes mellitus without complication ([place])   . GERD (gastroesophageal reflux disease)   . Hyperlipidemia   . Hypertension   . Peripheral vascular disease [facility])     Past Surgical History:  Procedure  Laterality Date  . ABDOMINAL AORTOGRAM W/LOWER EXTREMITY N/A 12/27/2016   Procedure: Abdominal Aortogram w/Lower Extremity;  Surgeon: Angelia Mould, MD;  Location: [place];  Service: Cardiovascular;  Laterality: N/A;  . CARDIAC SURGERY    . CORONARY ANGIOPLASTY WITH STENT PLACEMENT    . MENISCUS REPAIR Right   . NASAL SINUS SURGERY    . TONSILLECTOMY      Social History  Substance Use Topics  . Smoking status: Former Smoker    Quit date: 1994  . Smokeless tobacco: Former Systems developer  . Alcohol use 0.0 oz/week     Comment: 2-8 times per year    Family History  Problem Relation Age of Onset  . Alcohol abuse Brother   . Cancer Brother   . Diabetes Brother   . Hyperlipidemia Brother   . Hypertension Brother   . Diabetes Mother   . Hypertension Mother   . Hyperlipidemia Mother   . Heart attack Mother   . Hyperlipidemia Father   . Hypertension Father     Allergies  Allergen Reactions  . Dexilant [Dexlansoprazole]     Pressure in stomach and chest    Medication list has been reviewed and updated.  Current Outpatient Prescriptions on File Prior to Visit  Medication Sig Dispense Refill  . amLODipine (NORVASC) 10 MG tablet TAKE 1 TABLET BY MOUTH EVERY DAY 90 tablet 0  . aspirin 81 MG tablet Take 81 mg by mouth daily.    Marland Kitchen atorvastatin (LIPITOR) 80 MG tablet Take 1 tablet (80 mg total) by mouth daily. 90 tablet 0  . Blood Glucose Monitoring Suppl (FREESTYLE FREEDOM LITE) w/Device KIT Check blood sugar once daily 1 each 0  . Blood Glucose Monitoring Suppl (FREESTYLE LITE) DEVI CHECK BLOOD SUGAR ONCE DAILY  0  . busPIRone (BUSPAR) 10 MG tablet Take 1 tablet (10 mg total) by mouth 2 (two) times daily. 60 tablet 3  . Cholecalciferol (VITAMIN D3) 2000 units TABS Take 2,000 Units by mouth daily.    . Coenzyme Q10 300 MG CAPS Take 300 mg by mouth daily.     . Cyanocobalamin (VITAMIN B-12 PO) Take 2,500 mg by mouth daily.    . fenofibrate 160 MG tablet TAKE 1 TABLET(160  MG) BY MOUTH DAILY. 90 tablet 1  . finasteride (PROSCAR) 5 MG tablet Take 1 tablet (5 mg total) by mouth daily. 90 tablet 3  . glucose blood (FREESTYLE LITE) test strip Check blood sugar once daily 100 each 12  . Lancets (FREESTYLE) lancets Check blood sugar once daily 100 each 12  . lisinopril (PRINIVIL,ZESTRIL) 40 MG tablet Take 1 tablet (40 mg total) by mouth daily. 90 tablet 1  . metFORMIN (GLUCOPHAGE) 1000 MG tablet Take 1 tablet (1,000 mg total) by mouth 2 (two) times daily with a meal. 180 tablet 0  . metoprolol succinate (  TOPROL-XL) 100 MG 24 hr tablet Take 1 tablet (100 mg total) by mouth daily. Take with or immediately following a meal. 90 tablet 3  . MULTIPLE VITAMIN PO Take 1 tablet by mouth daily.     . niacin 500 MG tablet Take 500 mg by mouth at bedtime.    . Omega-3 Fatty Acids (EQL OMEGA 3 FISH OIL) 1000 MG CAPS Take 1 capsule by mouth 2 (two) times daily.    . pantoprazole (PROTONIX) 40 MG tablet Take 1 tablet (40 mg total) by mouth daily. 180 tablet 1  . ezetimibe (ZETIA) 10 MG tablet Take 1 tablet (10 mg total) by mouth daily. 90 tablet 3   No current facility-administered medications on file prior to visit.     Review of Systems:  As per HPI- otherwise negative. Claudication is worse in his right leg No CP or SOB No fever or chills  Physical Examination: Vitals:   05/04/17 0857 05/04/17 0903  BP: (!) 156/83 (!) 146/82  Pulse: 69   Temp: 98.2 F (36.8 C)    Vitals:   05/04/17 0857  Weight: 283 lb (128.4 kg)  Height: '5\' 9"'  (1.753 m)   Body mass index is 41.79 kg/m. Ideal Body Weight: Weight in (lb) to have BMI = 25: 168.9  GEN: WDWN, NAD, Non-toxic, A & O x 3, obese, looks well HEENT: Atraumatic, Normocephalic. Neck supple. No masses, No LAD.  Bilateral TM wnl, oropharynx normal.  PEERL,EOMI.   Ears and Nose: No external deformity. CV: RRR, No M/G/R. No JVD. No thrill. No extra heart sounds. PULM: CTA B, no wheezes, crackles, rhonchi. No retractions. No  resp. distress. No accessory muscle use. EXTR: No c/c/e NEURO Normal gait.  PSYCH: Normally interactive. Conversant. Not depressed or anxious appearing.  Calm demeanor.  Cannot palpate DP pulse in either foot    Assessment and Plan: Type 2 diabetes mellitus without complication, without long-term current use of insulin (HCC) - Plan: Hemoglobin I7T, Basic metabolic panel, Microalbumin / creatinine urine ratio  Dyslipidemia  Essential hypertension  Hypertriglyceridemia  Extremity atherosclerosis with intermittent claudication ([place])  Encouraged eye exam Labs pending as above- Will plan further follow- up pending labs. His BP is borderline- may need to adjust meds if continues to run high He plans to discuss his claudication with his vascular surgeon  Signed Lamar Blinks, MD  Received his labs 7/12  Results for orders placed or performed in visit on 05/04/17  Hemoglobin A1c  Result Value Ref Range   Hgb A1c MFr Bld 7.2 (H) 4.6 - 6.5 %  Basic metabolic panel  Result Value Ref Range   Sodium 135 135 - 145 mEq/L   Potassium 4.2 3.5 - 5.1 mEq/L   Chloride 102 96 - 112 mEq/L   CO2 23 19 - 32 mEq/L   Glucose, Bld 133 (H) 70 - 99 mg/dL   BUN 16 6 - 23 mg/dL   Creatinine, Ser 0.92 0.40 - 1.50 mg/dL   Calcium 10.2 8.4 - 10.5 mg/dL   GFR 87.66 >60.00 mL/min  Microalbumin / creatinine urine ratio  Result Value Ref Range   Microalb, Ur 1.0 0.0 - 1.9 mg/dL   Creatinine,U 45.1 mg/dL   Microalb Creat Ratio 2.2 0.0 - 30.0 mg/g

## 2017-05-04 ENCOUNTER — Ambulatory Visit (INDEPENDENT_AMBULATORY_CARE_PROVIDER_SITE_OTHER): Payer: Medicare Other | Admitting: Family Medicine

## 2017-05-04 VITALS — BP 142/90 | HR 69 | Temp 98.2°F | Ht 69.0 in | Wt 283.0 lb

## 2017-05-04 DIAGNOSIS — I70219 Atherosclerosis of native arteries of extremities with intermittent claudication, unspecified extremity: Secondary | ICD-10-CM

## 2017-05-04 DIAGNOSIS — E785 Hyperlipidemia, unspecified: Secondary | ICD-10-CM

## 2017-05-04 DIAGNOSIS — E781 Pure hyperglyceridemia: Secondary | ICD-10-CM

## 2017-05-04 DIAGNOSIS — E119 Type 2 diabetes mellitus without complications: Secondary | ICD-10-CM

## 2017-05-04 DIAGNOSIS — I10 Essential (primary) hypertension: Secondary | ICD-10-CM | POA: Diagnosis not present

## 2017-05-04 LAB — BASIC METABOLIC PANEL
BUN: 16 mg/dL (ref 6–23)
CALCIUM: 10.2 mg/dL (ref 8.4–10.5)
CO2: 23 meq/L (ref 19–32)
Chloride: 102 mEq/L (ref 96–112)
Creatinine, Ser: 0.92 mg/dL (ref 0.40–1.50)
GFR: 87.66 mL/min (ref >60.00)
Glucose, Bld: 133 mg/dL — ABNORMAL HIGH (ref 70–99)
POTASSIUM: 4.2 meq/L (ref 3.5–5.1)
SODIUM: 135 meq/L (ref 135–145)

## 2017-05-04 LAB — HEMOGLOBIN A1C: Hgb A1c MFr Bld: 7.2 % — ABNORMAL HIGH (ref 4.6–6.5)

## 2017-05-04 LAB — MICROALBUMIN / CREATININE URINE RATIO
CREATININE, U: 45.1 mg/dL
MICROALB/CREAT RATIO: 2.2 mg/g (ref 0.0–30.0)
Microalb, Ur: 1 mg/dL (ref 0.0–1.9)

## 2017-05-04 NOTE — Patient Instructions (Signed)
It was good to see you today- I am going to check your A1c, kidney function, and also will look for any protein in your urine today I will be in touch with your results asap Your blood pressure is borderline- we will need to keep a close eye on it.   You may want to follow-up with your vascular surgeon. Given your severe symptoms it may be time to consider surgery again  Take care!

## 2017-05-05 ENCOUNTER — Encounter: Payer: Self-pay | Admitting: Family Medicine

## 2017-05-06 ENCOUNTER — Other Ambulatory Visit: Payer: Self-pay | Admitting: Medical

## 2017-05-06 DIAGNOSIS — E781 Pure hyperglyceridemia: Secondary | ICD-10-CM

## 2017-05-06 DIAGNOSIS — E119 Type 2 diabetes mellitus without complications: Secondary | ICD-10-CM

## 2017-05-06 DIAGNOSIS — I10 Essential (primary) hypertension: Secondary | ICD-10-CM

## 2017-05-09 ENCOUNTER — Encounter: Payer: Self-pay | Admitting: Family Medicine

## 2017-05-09 ENCOUNTER — Other Ambulatory Visit: Payer: Self-pay | Admitting: Medical

## 2017-05-09 DIAGNOSIS — I10 Essential (primary) hypertension: Secondary | ICD-10-CM

## 2017-05-09 DIAGNOSIS — E119 Type 2 diabetes mellitus without complications: Secondary | ICD-10-CM

## 2017-05-09 DIAGNOSIS — E781 Pure hyperglyceridemia: Secondary | ICD-10-CM

## 2017-05-09 MED ORDER — METFORMIN HCL 1000 MG PO TABS: 1000.0000 mg | ORAL_TABLET | Freq: Two times a day (BID) | ORAL | 1 refills | Status: DC

## 2017-05-09 MED ORDER — ATORVASTATIN CALCIUM 80 MG PO TABS: 80.0000 mg | ORAL_TABLET | Freq: Every day | ORAL | 1 refills | Status: DC

## 2017-05-09 MED ORDER — FENOFIBRATE 160 MG PO TABS: ORAL_TABLET | ORAL | 1 refills | Status: DC

## 2017-05-09 MED ORDER — LISINOPRIL 40 MG PO TABS: 40.0000 mg | ORAL_TABLET | Freq: Every day | ORAL | 1 refills | Status: DC

## 2017-05-10 ENCOUNTER — Encounter: Payer: Self-pay | Admitting: Family Medicine

## 2017-06-13 ENCOUNTER — Encounter: Payer: Self-pay | Admitting: Family Medicine

## 2017-06-13 MED ORDER — GLUCOSE BLOOD VI STRP: ORAL_STRIP | 12 refills | Status: AC

## 2017-06-14 DIAGNOSIS — E119 Type 2 diabetes mellitus without complications: Secondary | ICD-10-CM | POA: Diagnosis not present

## 2017-06-14 DIAGNOSIS — H538 Other visual disturbances: Secondary | ICD-10-CM | POA: Diagnosis not present

## 2017-06-14 DIAGNOSIS — H5203 Hypermetropia, bilateral: Secondary | ICD-10-CM | POA: Diagnosis not present

## 2017-06-14 DIAGNOSIS — H04123 Dry eye syndrome of bilateral lacrimal glands: Secondary | ICD-10-CM | POA: Diagnosis not present

## 2017-06-14 DIAGNOSIS — H52223 Regular astigmatism, bilateral: Secondary | ICD-10-CM | POA: Diagnosis not present

## 2017-06-15 NOTE — Addendum Note (Signed)
Addended by: Lianne Cure A on: 06/15/2017 08:37 AM   Modules accepted: Orders

## 2017-06-22 ENCOUNTER — Encounter: Payer: Self-pay | Admitting: Vascular Surgery

## 2017-06-23 DIAGNOSIS — Z23 Encounter for immunization: Secondary | ICD-10-CM | POA: Diagnosis not present

## 2017-06-29 ENCOUNTER — Ambulatory Visit (INDEPENDENT_AMBULATORY_CARE_PROVIDER_SITE_OTHER): Payer: Medicare Other | Admitting: Vascular Surgery

## 2017-06-29 ENCOUNTER — Encounter: Payer: Self-pay | Admitting: Vascular Surgery

## 2017-06-29 ENCOUNTER — Ambulatory Visit (HOSPITAL_COMMUNITY)
Admit: 2017-06-29 | Discharge: 2017-06-29 | Disposition: A | Discharge status: Home / Self Care | Payer: Medicare Other | Attending: Vascular Surgery | Admitting: Vascular Surgery

## 2017-06-29 VITALS — BP 136/89 | HR 74 | Temp 97.9°F | Ht 69.0 in | Wt 283.5 lb

## 2017-06-29 DIAGNOSIS — I739 Peripheral vascular disease, unspecified: Secondary | ICD-10-CM | POA: Insufficient documentation

## 2017-06-29 DIAGNOSIS — I70219 Atherosclerosis of native arteries of extremities with intermittent claudication, unspecified extremity: Secondary | ICD-10-CM | POA: Diagnosis not present

## 2017-06-29 NOTE — Progress Notes (Signed)
Patient name: David Singh MRN: [id number] DOB: Mar 11, 1951 Sex: male  REASON FOR VISIT:    Follow up of peripheral vascular disease.  HPI:   David Singh is a pleasant 66 y.o. male who presented with aggressive right lower extremity claudication. He underwent an arteriogram on 12/27/2016. This showed that his infrarenal aorta was patent with a large bulky calcific plaque at the aortic bifurcation and occlusion of the right common iliac artery and significant plaque in the proximal left common iliac artery. The inferior mesenteric artery was markedly enlarged providing collateral flow around this occlusion. Given his obesity I did not think he was a good candidate for aortofemoral bypass grafting. If his symptoms progressed I felt that we could consider iliac angioplasty would be associated with increased risk given the bulky calcific plaque. One option might potentially began to plasty and stenting of the left iliac system with a left-to-right femorofemoral bypass graft. However given his obesity he would be at increased risk for wound healing problems and infection. For all these reasons I favor a conservative approach.  Since I saw him last, he continues to have bilateral lower; dictation which is more significant on the right side. He experiences pain in the hip thigh and calf which is brought on by ambulation relieved with rest. There are no other aggravating or alleviating factors. He does get some occasional paresthesias in his feet more significantly on the left side. He denies any history of rest pain or nonhealing ulcers. He quit smoking 20 years ago.  Past Medical History:  Diagnosis Date  . CAD (coronary artery disease)   . Diabetes mellitus without complication ([place])   . GERD (gastroesophageal reflux disease)   . Hyperlipidemia   . Hypertension   . Peripheral vascular disease ([place])     Family History  Problem Relation Age of Onset  . Alcohol abuse Brother   . Cancer Brother    . Diabetes Brother   . Hyperlipidemia Brother   . Hypertension Brother   . Diabetes Mother   . Hypertension Mother   . Hyperlipidemia Mother   . Heart attack Mother   . Hyperlipidemia Father   . Hypertension Father     SOCIAL HISTORY: Social History  Substance Use Topics  . Smoking status: Former Smoker    Quit date: 1994  . Smokeless tobacco: Former Systems developer  . Alcohol use 0.0 oz/week     Comment: 2-8 times per year    Allergies  Allergen Reactions  . Dexilant [Dexlansoprazole]     Pressure in stomach and chest    Current Outpatient Prescriptions  Medication Sig Dispense Refill  . amLODipine (NORVASC) 10 MG tablet TAKE 1 TABLET BY MOUTH EVERY DAY 90 tablet 0  . aspirin 81 MG tablet Take 81 mg by mouth daily.    Marland Kitchen atorvastatin (LIPITOR) 80 MG tablet Take 1 tablet (80 mg total) by mouth daily. 90 tablet 1  . Black Pepper-Turmeric (TURMERIC CURCUMIN) 02-999 MG CAPS Take 2 capsules by mouth daily.    . Blood Glucose Monitoring Suppl (FREESTYLE FREEDOM LITE) w/Device KIT Check blood sugar once daily 1 each 0  . Blood Glucose Monitoring Suppl (FREESTYLE LITE) DEVI CHECK BLOOD SUGAR ONCE DAILY  0  . busPIRone (BUSPAR) 10 MG tablet Take 1 tablet (10 mg total) by mouth 2 (two) times daily. 60 tablet 3  . Cholecalciferol (VITAMIN D3) 2000 units TABS Take 2,000 Units by mouth daily.    . Coenzyme Q10 300 MG CAPS Take 300 mg by  mouth daily.     . Cyanocobalamin (VITAMIN B-12 PO) Take 2,500 mg by mouth daily.    . fenofibrate 160 MG tablet TAKE 1 TABLET(160 MG) BY MOUTH DAILY. 90 tablet 1  . finasteride (PROSCAR) 5 MG tablet Take 1 tablet (5 mg total) by mouth daily. 90 tablet 3  . glucose blood (FREESTYLE LITE) test strip Check blood sugar 1-3x daily 100 each 12  . Lancets (FREESTYLE) lancets Check blood sugar once daily 100 each 12  . lisinopril (PRINIVIL,ZESTRIL) 40 MG tablet Take 1 tablet (40 mg total) by mouth daily. 90 tablet 1  . metFORMIN (GLUCOPHAGE) 1000 MG tablet Take 1  tablet (1,000 mg total) by mouth 2 (two) times daily with a meal. 180 tablet 1  . metoprolol succinate (TOPROL-XL) 100 MG 24 hr tablet Take 1 tablet (100 mg total) by mouth daily. Take with or immediately following a meal. 90 tablet 3  . MULTIPLE VITAMIN PO Take 1 tablet by mouth daily.     . niacin 500 MG tablet Take 500 mg by mouth at bedtime.    . Omega-3 Fatty Acids (EQL OMEGA 3 FISH OIL) 1000 MG CAPS Take 1 capsule by mouth 2 (two) times daily.    Marland Kitchen OVER THE COUNTER MEDICATION Take 1 each by mouth daily. CBD Oil-1 dropperful daily    . pantoprazole (PROTONIX) 40 MG tablet Take 1 tablet (40 mg total) by mouth daily. 180 tablet 1  . ezetimibe (ZETIA) 10 MG tablet Take 1 tablet (10 mg total) by mouth daily. 90 tablet 3   No current facility-administered medications for this visit.     REVIEW OF SYSTEMS:  '[X]'  denotes positive finding, '[ ]'  denotes negative finding Cardiac  Comments:  Chest pain or chest pressure:    Shortness of breath upon exertion:    Short of breath when lying flat:    Irregular heart rhythm:        Vascular    Pain in calf, thigh, or hip brought on by ambulation: X   Pain in feet at night that wakes you up from your sleep:     Blood clot in your veins:    Leg swelling:         Pulmonary    Oxygen at home:    Productive cough:     Wheezing:         Neurologic    Sudden weakness in arms or legs:     Sudden numbness in arms or legs:     Sudden onset of difficulty speaking or slurred speech:    Temporary loss of vision in one eye:     Problems with dizziness:         Gastrointestinal    Blood in stool:     Vomited blood:         Genitourinary    Burning when urinating:     Blood in urine:        Psychiatric    Major depression:         Hematologic    Bleeding problems:    Problems with blood clotting too easily:        Skin    Rashes or ulcers:        Constitutional    Fever or chills:     PHYSICAL EXAM:   Vitals:   06/29/17 1145  BP:  136/89  Pulse: 74  Temp: 97.9 F (36.6 C)  TempSrc: Oral  SpO2: 94%  Weight: 283 lb 8  oz (128.6 kg)  Height: '5\' 9"'  (1.753 m)    GENERAL: The patient is a well-nourished male, in no acute distress. The vital signs are documented above. CARDIAC: There is a regular rate and rhythm.  VASCULAR: I do not detect carotid bruits. I cannot palpate femoral pulses, popliteal,, or pedal pulses. PULMONARY: There is good air exchange bilaterally without wheezing or rales. ABDOMEN: Soft and non-tender with normal pitched bowel sounds.  MUSCULOSKELETAL: There are no major deformities or cyanosis. NEUROLOGIC: No focal weakness or paresthesias are detected. SKIN: There are no ulcers or rashes noted. PSYCHIATRIC: The patient has a normal affect.  DATA:    ARTERIAL DOPPLER STUDY: I have inability interpreted his arterial Doppler study today.  On the right side there is a monophasic posterior tibial signal with a biphasic dorsalis pedis signal. ABI on the right is 74%. Toe pressure on the right is 69 mmHg.  On the left side there is a triphasic posterior tibial signal and a biphasic dorsalis pedis signal. ABI is 96% with a toe pressure of 103 mmHg.  ARTERIOGRAM: I reviewed his previous arteriogram was done on 12/27/2016. This shows a bulky calcific plaque in the distal aorta and right lateral common iliac arteries. He has fairly good perfusion below this bilaterally.  MEDICAL ISSUES:   AORTOILIAC OCCLUSIVE DISEASE: This patient has significant aortoiliac occlusive disease. He has bulky calcific plaques in the distal aorta and bilateral common iliac arteries. Given his obesity I would favor a conservative approach if at all possible. We have again discussed the importance of tobacco cessation. He quit smoking 20 years ago. If his symptoms progress, his options would be an aggressive endovascular approach with bilateral covered stents versus aortofemoral bypass grafting. I were to follow up ABIs in 6 months  and I'll see him back at that time. He knows to call sooner if he has problems.  Deitra Mayo [facility] [phone]

## 2017-06-30 NOTE — Addendum Note (Signed)
Addended by: Lianne Cure A on: 06/30/2017 10:26 AM   Modules accepted: Orders

## 2017-08-02 ENCOUNTER — Encounter: Payer: Self-pay | Admitting: Family Medicine

## 2017-08-04 ENCOUNTER — Other Ambulatory Visit: Payer: Self-pay | Admitting: Medical

## 2017-08-04 DIAGNOSIS — I10 Essential (primary) hypertension: Secondary | ICD-10-CM

## 2017-08-06 ENCOUNTER — Other Ambulatory Visit: Payer: Self-pay | Admitting: Medical

## 2017-10-04 NOTE — Progress Notes (Addendum)
[facility] [address] [place], [place] [address] [phone]  Date:  10/06/2017   Name:  David Singh   DOB:  Aug 15, 1951   MRN:  [id number]  PCP:  Darreld Mclean, MD    Chief Complaint: No chief complaint on file.   History of Present Illness:  David Singh is a 66 y.o. very pleasant male patient who presents with the following:  Follow-up visit today History of DM, CAD, PAD, hypertension, hyperlipidemia He saw vascular surgery in September- they plan to do follow-up ABI in 6 months, for the time being are not planning surgery.  [place] plans to see him in the first of the year for a recheck visit  I last saw him in July:  Per cardiology note from March:  HPI: 66 yo male for evaluation of CAD. Patient apparently had PCI in [place] 10 years ago. No records available. Patient is followed by vascular surgery for peripheral vascular disease. Angiogram March 2018 showed no renal artery stenosis. 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 There was reconstitution of the distal right common iliac artery it was felt medical therapy indicated. Patient denies dyspnea, chest pain, palpitations or syncope. He has bilateral lower extremity claudication after walking approximately 25 yards right greater than left.  He did a stress in April:  The left ventricular ejection fraction is mildly decreased (45-54%).  Nuclear stress EF: 54%.  There was no ST segment deviation noted during stress.  The study is normal.  Normal stress nuclear study with no ischemia or infarction; EF 54 with normal wall motion; mild LVE.  He had sent me an email about 3 weeks ago with concern about his blood sugar being too high.  However things seem to have stabilized again since then.  We will get his A1c for him today  RecentLabs       Lab Results  Component Value  Date   HGBA1C 7.0 (H) 12/09/2016     He has also seen vascular surgery about his significant claudication. So far they have shied away from surgery due to risks.  His claudication really limits his exercise ability and enjoyment of life.  He may be getting to the point where surgery is necessary - he will see vascular again in September to re-assess      BP Readings from Last 3 Encounters:  05/04/17 (!) 146/82  02/07/17 (!) 141/76  01/19/17 130/82   He is trying to lose weight but it does not seem to be working.  He cannot really exercise however due to his legs     Wt Readings from Last 3 Encounters:  05/04/17 283 lb (128.4 kg)  02/07/17 275 lb (124.7 kg)  02/01/17 277 lb (125.6 kg)   He has OA in his knees and is thinking about seeing ortho for this - may be able to have some injections to improve his joint function  He and his wife have noticed some mild memory difficulty- he may forget names, etc.  Nothing more serious such as getting lost in a familiar area.  Offered to have him evaluated by neurology- he prefers to continue observing for now but will let me know if their concerns increase   Lab Results  Component Value Date   HGBA1C 7.2 (H) 05/04/2017   Foot exam: today Eye exam: this was done about 3 months ago, diabetes  exam done.  All ok per pt  A1c is due today  He plans to have a urolift procedure done with Dr. Cassandria Anger at [facility]  He is having urinary leakage with which is really bothersome to him His fasting sugars are generally 130 or less  Will obtain A1c for him today  He is fasting today for labs  He uses protonix as needed He is taking fenofibrate, ezetimibe and also atorvastatin. However his fenifibrate is no longer covered so we will DC this.  His zetia is also going to go way up in price next year.   Lab Results  Component Value Date   PSA 0.91 12/09/2016   PSA 1.08 11/05/2015    Patient Active Problem List   Diagnosis Date Noted  . Chronic  gout 03/09/2016  . Hypertriglyceridemia 01/16/2015  . Type 2 diabetes mellitus ([place]) 01/06/2015  . CAD in native artery 01/06/2015  . History of coronary artery stent placement 01/06/2015  . PAD (peripheral artery disease) ([place]) 01/06/2015  . Hyperlipidemia 01/06/2015  . Essential hypertension 01/06/2015    Past Medical History:  Diagnosis Date  . CAD (coronary artery disease)   . Diabetes mellitus without complication ([place])   . GERD (gastroesophageal reflux disease)   . Hyperlipidemia   . Hypertension   . Peripheral vascular disease [facility])     Past Surgical History:  Procedure Laterality Date  . ABDOMINAL AORTOGRAM W/LOWER EXTREMITY N/A 12/27/2016   Procedure: Abdominal Aortogram w/Lower Extremity;  Surgeon: Angelia Mould, MD;  Location: [facility];  Service: Cardiovascular;  Laterality: N/A;  . CARDIAC SURGERY    . CORONARY ANGIOPLASTY WITH STENT PLACEMENT    . MENISCUS REPAIR Right   . NASAL SINUS SURGERY    . TONSILLECTOMY      Social History   Tobacco Use  . Smoking status: Former Smoker    Last attempt to quit: 1994    Years since quitting: 24.9  . Smokeless tobacco: Former Network engineer Use Topics  . Alcohol use: Yes    Alcohol/week: 0.0 oz    Comment: 2-8 times per year  . Drug use: No    Family History  Problem Relation Age of Onset  . Alcohol abuse Brother   . Cancer Brother   . Diabetes Brother   . Hyperlipidemia Brother   . Hypertension Brother   . Diabetes Mother   . Hypertension Mother   . Hyperlipidemia Mother   . Heart attack Mother   . Hyperlipidemia Father   . Hypertension Father     Allergies  Allergen Reactions  . Dexilant [Dexlansoprazole]     Pressure in stomach and chest    Medication list has been reviewed and updated.  Current Outpatient Medications on File Prior to Visit  Medication Sig Dispense Refill  . amLODipine (NORVASC) 10 MG tablet TAKE 1 TABLET BY MOUTH EVERY DAY 90 tablet 0  . aspirin 81 MG tablet  Take 81 mg by mouth daily.    Marland Kitchen atorvastatin (LIPITOR) 80 MG tablet Take 1 tablet (80 mg total) by mouth daily. 90 tablet 1  . Black Pepper-Turmeric (TURMERIC CURCUMIN) 02-999 MG CAPS Take 2 capsules by mouth daily.    . Blood Glucose Monitoring Suppl (FREESTYLE FREEDOM LITE) w/Device KIT Check blood sugar once daily 1 each 0  . Blood Glucose Monitoring Suppl (FREESTYLE LITE) DEVI CHECK BLOOD SUGAR ONCE DAILY  0  . busPIRone (BUSPAR) 10 MG tablet TAKE ONE TABLET BY MOUTH TWICE DAILY  60 tablet  2  . Cholecalciferol (VITAMIN D3) 2000 units TABS Take 2,000 Units by mouth daily.    . Coenzyme Q10 300 MG CAPS Take 300 mg by mouth daily.     . Cyanocobalamin (VITAMIN B-12 PO) Take 2,500 mg by mouth daily.    Marland Kitchen ezetimibe (ZETIA) 10 MG tablet Take 1 tablet (10 mg total) by mouth daily. 90 tablet 3  . fenofibrate 160 MG tablet TAKE 1 TABLET(160 MG) BY MOUTH DAILY. 90 tablet 1  . finasteride (PROSCAR) 5 MG tablet Take 1 tablet (5 mg total) by mouth daily. 90 tablet 3  . glucose blood (FREESTYLE LITE) test strip Check blood sugar 1-3x daily 100 each 12  . Lancets (FREESTYLE) lancets Check blood sugar once daily 100 each 12  . lisinopril (PRINIVIL,ZESTRIL) 40 MG tablet Take 1 tablet (40 mg total) by mouth daily. 90 tablet 1  . metFORMIN (GLUCOPHAGE) 1000 MG tablet Take 1 tablet (1,000 mg total) by mouth 2 (two) times daily with a meal. 180 tablet 1  . metoprolol succinate (TOPROL-XL) 100 MG 24 hr tablet Take 1 tablet (100 mg total) by mouth daily. Take with or immediately following a meal. 90 tablet 3  . MULTIPLE VITAMIN PO Take 1 tablet by mouth daily.     . niacin 500 MG tablet Take 500 mg by mouth at bedtime.    . Omega-3 Fatty Acids (EQL OMEGA 3 FISH OIL) 1000 MG CAPS Take 1 capsule by mouth 2 (two) times daily.    Marland Kitchen OVER THE COUNTER MEDICATION Take 1 each by mouth daily. CBD Oil-1 dropperful daily    . pantoprazole (PROTONIX) 40 MG tablet Take 1 tablet (40 mg total) by mouth daily. 180 tablet 1   No  current facility-administered medications on file prior to visit.     Review of Systems:  As per HPI- otherwise negative. No fever or chills He notes that he recently was shoveling snow and did not have any chest pain- he took this as a good sign    Physical Examination: Vitals:   10/06/17 0911  BP: (!) 144/80  Pulse: 76  Resp: 16  Temp: 98 F (36.7 C)  SpO2: 97%   Vitals:   10/06/17 0911  Weight: 285 lb (129.3 kg)  Height: '5\' 9"'$  (1.753 m)   Body mass index is 42.09 kg/m. Ideal Body Weight: Weight in (lb) to have BMI = 25: 168.9  GEN: WDWN, NAD, Non-toxic, A & O x 3, obese, otherwise looks well HEENT: Atraumatic, Normocephalic. Neck supple. No masses, No LAD.  Bilateral TM wnl, oropharynx normal.  PEERL,EOMI.   Ears and Nose: No external deformity. CV: RRR, No M/G/R. No JVD. No thrill. No extra heart sounds. PULM: CTA B, no wheezes, crackles, rhonchi. No retractions. No resp. distress. No accessory muscle use. ABD: S, NT, ND, +BS. No rebound. No HSM. EXTR: No c/c/e NEURO Normal gait.  PSYCH: Normally interactive. Conversant. Not depressed or anxious appearing.  Calm demeanor.    Assessment and Plan: Type 2 diabetes mellitus without complication, without long-term current use of insulin (HCC) - Plan: Comprehensive metabolic panel, Hemoglobin A1c, metFORMIN (GLUCOPHAGE) 1000 MG tablet  Essential hypertension - Plan: CBC, Comprehensive metabolic panel, amLODipine (NORVASC) 10 MG tablet, lisinopril (PRINIVIL,ZESTRIL) 40 MG tablet, metoprolol succinate (TOPROL-XL) 100 MG 24 hr tablet  Hypertriglyceridemia - Plan: Lipid panel, atorvastatin (LIPITOR) 80 MG tablet  Dyslipidemia - Plan: Lipid panel, atorvastatin (LIPITOR) 80 MG tablet  Extremity atherosclerosis with intermittent claudication (HCC)  Benign prostatic hyperplasia with post-void  dribbling - Plan: PSA  Screening for prostate cancer - Plan: PSA  CAD in native artery - Plan: Lipid panel  Gastroesophageal  reflux disease, esophagitis presence not specified - Plan: pantoprazole (PROTONIX) 40 MG tablet  GAD (generalized anxiety disorder) - Plan: busPIRone (BUSPAR) 10 MG tablet  Here today for a follow-up visit Labs pending as above He has dealt with some stressors this year, but feels that his mood is ok with the buspar that he is using   It was nice to see you today!  I will be in touch with your labs Let's stop the fenofibrate I will get your cholesterol results back and touch base with Dr. Stanford Breed about the ezetimibe- we may be able to replace this medication for you I did not refill the finasteride today, because I am not sure if you will still need this after your upcoming prostate operation.  However, if you do need it I am happy to refill for you  Let's plan to recheck in 6 months unless your labs suggest otherwise   Signed Lamar Blinks, MD  Received his labs 12/16 Results for orders placed or performed in visit on 10/06/17  CBC  Result Value Ref Range   WBC 5.9 4.0 - 10.5 K/uL   RBC 4.78 4.22 - 5.81 Mil/uL   Platelets 188.0 150.0 - 400.0 K/uL   Hemoglobin 14.1 13.0 - 17.0 g/dL   HCT 42.1 39.0 - 52.0 %   MCV 88.1 78.0 - 100.0 fl   MCHC 33.5 30.0 - 36.0 g/dL   RDW 13.6 11.5 - 15.5 %  Comprehensive metabolic panel  Result Value Ref Range   Sodium 136 135 - 145 mEq/L   Potassium 4.4 3.5 - 5.1 mEq/L   Chloride 101 96 - 112 mEq/L   CO2 27 19 - 32 mEq/L   Glucose, Bld 133 (H) 70 - 99 mg/dL   BUN 15 6 - 23 mg/dL   Creatinine, Ser 0.85 0.40 - 1.50 mg/dL   Total Bilirubin 0.5 0.2 - 1.2 mg/dL   Alkaline Phosphatase 33 (L) 39 - 117 U/L   AST 23 0 - 37 U/L   ALT 36 0 - 53 U/L   Total Protein 7.0 6.0 - 8.3 g/dL   Albumin 4.5 3.5 - 5.2 g/dL   Calcium 9.6 8.4 - 10.5 mg/dL   GFR 95.91 >60.00 mL/min  Hemoglobin A1c  Result Value Ref Range   Hgb A1c MFr Bld 7.3 (H) 4.6 - 6.5 %  Lipid panel  Result Value Ref Range   Cholesterol 130 0 - 200 mg/dL   Triglycerides 190.0 (H) 0.0  - 149.0 mg/dL   HDL 35.60 (L) >39.00 mg/dL   VLDL 38.0 0.0 - 40.0 mg/dL   LDL Cholesterol 56 0 - 99 mg/dL   Total CHOL/HDL Ratio 4    NonHDL 94.28   PSA  Result Value Ref Range   PSA 0.68 0.10 - 4.00 ng/mL    Lab Results  Component Value Date   PSA 0.68 10/06/2017   PSA 0.91 12/09/2016   PSA 1.08 11/05/2015   Message to pt  Your blood counts are normal Metabolic profile is normal  Your cholesterol is not bad, but as you know we are making some changes to your regimen. I sent a message to Dr. Stanford Breed about his preferences for your cholesterol meds and will get back with you.   Your A1c is still hight than goal and is gradually going up.  I think we need to  add a medication to your regimen, and continue the metformin.  I was thinking of adding a low dose of a medication called glipizide which is inexpensive and generic. Would this be ok with you? You can reply to me directly here [place]

## 2017-10-06 ENCOUNTER — Encounter: Payer: Self-pay | Admitting: Family Medicine

## 2017-10-06 ENCOUNTER — Ambulatory Visit (INDEPENDENT_AMBULATORY_CARE_PROVIDER_SITE_OTHER): Payer: Medicare Other | Admitting: Family Medicine

## 2017-10-06 VITALS — BP 144/80 | HR 76 | Temp 98.0°F | Resp 16 | Ht 69.0 in | Wt 285.0 lb

## 2017-10-06 DIAGNOSIS — E119 Type 2 diabetes mellitus without complications: Secondary | ICD-10-CM | POA: Diagnosis not present

## 2017-10-06 DIAGNOSIS — E781 Pure hyperglyceridemia: Secondary | ICD-10-CM | POA: Diagnosis not present

## 2017-10-06 DIAGNOSIS — E785 Hyperlipidemia, unspecified: Secondary | ICD-10-CM

## 2017-10-06 DIAGNOSIS — F411 Generalized anxiety disorder: Secondary | ICD-10-CM | POA: Diagnosis not present

## 2017-10-06 DIAGNOSIS — N401 Enlarged prostate with lower urinary tract symptoms: Secondary | ICD-10-CM

## 2017-10-06 DIAGNOSIS — I2510 Atherosclerotic heart disease of native coronary artery without angina pectoris: Secondary | ICD-10-CM

## 2017-10-06 DIAGNOSIS — K219 Gastro-esophageal reflux disease without esophagitis: Secondary | ICD-10-CM | POA: Diagnosis not present

## 2017-10-06 DIAGNOSIS — Z125 Encounter for screening for malignant neoplasm of prostate: Secondary | ICD-10-CM | POA: Diagnosis not present

## 2017-10-06 DIAGNOSIS — I70219 Atherosclerosis of native arteries of extremities with intermittent claudication, unspecified extremity: Secondary | ICD-10-CM | POA: Diagnosis not present

## 2017-10-06 DIAGNOSIS — I10 Essential (primary) hypertension: Secondary | ICD-10-CM | POA: Diagnosis not present

## 2017-10-06 DIAGNOSIS — N3943 Post-void dribbling: Secondary | ICD-10-CM | POA: Diagnosis not present

## 2017-10-06 LAB — COMPREHENSIVE METABOLIC PANEL
ALBUMIN: 4.5 g/dL (ref 3.5–5.2)
ALK PHOS: 33 U/L — AB (ref 39–117)
ALT: 36 U/L (ref 0–53)
AST: 23 U/L (ref 0–37)
BUN: 15 mg/dL (ref 6–23)
CO2: 27 mEq/L (ref 19–32)
CREATININE: 0.85 mg/dL (ref 0.40–1.50)
Calcium: 9.6 mg/dL (ref 8.4–10.5)
Chloride: 101 mEq/L (ref 96–112)
GFR: 95.91 mL/min (ref >60.00)
Glucose, Bld: 133 mg/dL — ABNORMAL HIGH (ref 70–99)
POTASSIUM: 4.4 meq/L (ref 3.5–5.1)
SODIUM: 136 meq/L (ref 135–145)
TOTAL PROTEIN: 7 g/dL (ref 6.0–8.3)
Total Bilirubin: 0.5 mg/dL (ref 0.2–1.2)

## 2017-10-06 LAB — LIPID PANEL
Cholesterol: 130 mg/dL (ref 0–200)
HDL: 35.6 mg/dL — AB (ref >39.00)
LDL Cholesterol: 56 mg/dL (ref 0–99)
NONHDL: 94.28
Total CHOL/HDL Ratio: 4
Triglycerides: 190 mg/dL — ABNORMAL HIGH (ref 0.0–149.0)
VLDL: 38 mg/dL (ref 0.0–40.0)

## 2017-10-06 LAB — CBC
HCT: 42.1 % (ref 39.0–52.0)
Hemoglobin: 14.1 g/dL (ref 13.0–17.0)
MCHC: 33.5 g/dL (ref 30.0–36.0)
MCV: 88.1 fl (ref 78.0–100.0)
PLATELETS: 188 10*3/uL (ref 150.0–400.0)
RBC: 4.78 Mil/uL (ref 4.22–5.81)
RDW: 13.6 % (ref 11.5–15.5)
WBC: 5.9 10*3/uL (ref 4.0–10.5)

## 2017-10-06 LAB — HEMOGLOBIN A1C: HEMOGLOBIN A1C: 7.3 % — AB (ref 4.6–6.5)

## 2017-10-06 LAB — PSA: PSA: 0.68 ng/mL (ref 0.10–4.00)

## 2017-10-06 MED ORDER — METFORMIN HCL 1000 MG PO TABS: 1000.0000 mg | ORAL_TABLET | Freq: Two times a day (BID) | ORAL | 3 refills | Status: DC

## 2017-10-06 MED ORDER — ATORVASTATIN CALCIUM 80 MG PO TABS: 80.0000 mg | ORAL_TABLET | Freq: Every day | ORAL | 3 refills | Status: DC

## 2017-10-06 MED ORDER — LISINOPRIL 40 MG PO TABS: 40.0000 mg | ORAL_TABLET | Freq: Every day | ORAL | 3 refills | Status: DC

## 2017-10-06 MED ORDER — BUSPIRONE HCL 10 MG PO TABS: 10.0000 mg | ORAL_TABLET | Freq: Two times a day (BID) | ORAL | 3 refills | Status: DC

## 2017-10-06 MED ORDER — AMLODIPINE BESYLATE 10 MG PO TABS: 10.0000 mg | ORAL_TABLET | Freq: Every day | ORAL | 3 refills | Status: DC

## 2017-10-06 MED ORDER — METOPROLOL SUCCINATE ER 100 MG PO TB24: 100.0000 mg | ORAL_TABLET | Freq: Every day | ORAL | 3 refills | Status: DC

## 2017-10-06 MED ORDER — PANTOPRAZOLE SODIUM 40 MG PO TBEC: 40.0000 mg | DELAYED_RELEASE_TABLET | Freq: Every day | ORAL | 3 refills | Status: DC

## 2017-10-06 NOTE — Patient Instructions (Signed)
It was nice to see you today!  I will be in touch with your labs Let's stop the fenofibrate I will get your cholesterol results back and touch base with Dr. Stanford Breed about the ezetimibe- we may be able to replace this medication for you I did not refill the finasteride today, because I am not sure if you will still need this after your upcoming prostate operation.  However, if you do need it I am happy to refill for you  Let's plan to recheck in 6 months unless your labs suggest otherwise

## 2017-10-09 ENCOUNTER — Encounter: Payer: Self-pay | Admitting: Family Medicine

## 2017-10-10 MED ORDER — GLIPIZIDE 5 MG PO TABS: 5.0000 mg | ORAL_TABLET | Freq: Every day | ORAL | 6 refills | Status: DC

## 2017-10-11 ENCOUNTER — Encounter: Payer: Self-pay | Admitting: Family Medicine

## 2017-10-11 DIAGNOSIS — F411 Generalized anxiety disorder: Secondary | ICD-10-CM

## 2017-10-14 MED ORDER — BUSPIRONE HCL 10 MG PO TABS: 10.0000 mg | ORAL_TABLET | Freq: Two times a day (BID) | ORAL | 3 refills | Status: DC

## 2017-10-14 NOTE — Telephone Encounter (Signed)
Called pt- was able to find at [facility].  Sent rx there for him.  Let him know

## 2017-10-25 ENCOUNTER — Encounter: Payer: Self-pay | Admitting: Family Medicine

## 2017-11-04 DIAGNOSIS — R3915 Urgency of urination: Secondary | ICD-10-CM | POA: Diagnosis not present

## 2017-11-04 DIAGNOSIS — N401 Enlarged prostate with lower urinary tract symptoms: Secondary | ICD-10-CM | POA: Diagnosis not present

## 2017-11-05 ENCOUNTER — Encounter: Payer: Self-pay | Admitting: Family Medicine

## 2017-11-05 DIAGNOSIS — I10 Essential (primary) hypertension: Secondary | ICD-10-CM

## 2017-11-06 ENCOUNTER — Encounter: Payer: Self-pay | Admitting: Family Medicine

## 2017-11-08 MED ORDER — LISINOPRIL-HYDROCHLOROTHIAZIDE 20-25 MG PO TABS: 1.0000 | ORAL_TABLET | Freq: Every day | ORAL | 3 refills | Status: DC

## 2017-11-08 NOTE — Addendum Note (Signed)
Addended by: Lamar Blinks C on: 11/08/2017 08:19 AM   Modules accepted: Orders

## 2017-11-21 ENCOUNTER — Other Ambulatory Visit: Payer: Self-pay | Admitting: Family Medicine

## 2017-11-28 ENCOUNTER — Other Ambulatory Visit (INDEPENDENT_AMBULATORY_CARE_PROVIDER_SITE_OTHER): Payer: Medicare HMO

## 2017-11-28 DIAGNOSIS — I10 Essential (primary) hypertension: Secondary | ICD-10-CM | POA: Diagnosis not present

## 2017-11-28 LAB — BASIC METABOLIC PANEL
BUN: 18 mg/dL (ref 6–23)
CALCIUM: 9.4 mg/dL (ref 8.4–10.5)
CHLORIDE: 95 meq/L — AB (ref 96–112)
CO2: 22 meq/L (ref 19–32)
Creatinine, Ser: 0.94 mg/dL (ref 0.40–1.50)
GFR: 85.36 mL/min (ref >60.00)
Glucose, Bld: 153 mg/dL — ABNORMAL HIGH (ref 70–99)
Potassium: 4.1 mEq/L (ref 3.5–5.1)
Sodium: 131 mEq/L — ABNORMAL LOW (ref 135–145)

## 2017-11-29 ENCOUNTER — Encounter: Payer: Self-pay | Admitting: Family Medicine

## 2017-11-29 ENCOUNTER — Other Ambulatory Visit: Payer: Self-pay | Admitting: Family Medicine

## 2017-11-29 DIAGNOSIS — E871 Hypo-osmolality and hyponatremia: Secondary | ICD-10-CM

## 2017-12-28 ENCOUNTER — Ambulatory Visit: Payer: Medicare Other | Admitting: Vascular Surgery

## 2017-12-28 ENCOUNTER — Other Ambulatory Visit: Payer: Self-pay

## 2017-12-28 ENCOUNTER — Encounter: Payer: Self-pay | Admitting: Vascular Surgery

## 2017-12-28 ENCOUNTER — Ambulatory Visit (HOSPITAL_COMMUNITY)
Admission: RE | Admit: 2017-12-28 | Discharge: 2017-12-28 | Disposition: A | Discharge status: Home / Self Care | Payer: Medicare HMO | Source: Ambulatory Visit | Attending: Vascular Surgery | Admitting: Vascular Surgery

## 2017-12-28 ENCOUNTER — Ambulatory Visit: Payer: Medicare HMO | Admitting: Vascular Surgery

## 2017-12-28 ENCOUNTER — Ambulatory Visit (HOSPITAL_COMMUNITY): Payer: Medicare Other

## 2017-12-28 VITALS — BP 175/92 | HR 71 | Temp 99.9°F | Resp 20 | Ht 69.0 in | Wt 291.0 lb

## 2017-12-28 DIAGNOSIS — I739 Peripheral vascular disease, unspecified: Secondary | ICD-10-CM | POA: Diagnosis not present

## 2017-12-28 NOTE — Progress Notes (Signed)
Patient name: David Singh MRN: [id number] DOB: 15-Feb-1951 Sex: male  REASON FOR VISIT:   Follow-up of peripheral vascular disease.  HPI:   David Singh is a pleasant 67 y.o. male who I have been following with peripheral vascular disease.  I last saw in 1 06/29/2017.  He has undergone a previous arteriogram on 12/27/2016 which showed bulky calcific plaque in the distal aorta and bilateral common iliac arteries.  Given his obesity I do not think he was a good candidate for aortofemoral bypass grafting.  I felt that if his symptoms progress his options might include in a more aggressive endovascular approach with bilateral covered stents versus aortofemoral bypass grafting.  He comes in for a 46-monthfollow-up visit.  Since I saw him last he does continue to have bilateral lower extremity claudication.  His symptoms are more significant on the right side.  He experiences pain mostly in his calves but also somewhat in his thighs.  His symptoms are brought on by ambulation and relieved with rest.  He thinks that his symptoms have gradually improved over the last 6 months.  He denies any history of rest pain.  He denies any history of nonhealing wounds.  He quit smoking 21 years ago.  There have been no significant changes in his medical history.  Past Medical History:  Diagnosis Date  . CAD (coronary artery disease)   . Diabetes mellitus without complication ([place]   . GERD (gastroesophageal reflux disease)   . Hyperlipidemia   . Hypertension   . Peripheral vascular disease ([place]     Family History  Problem Relation Age of Onset  . Alcohol abuse Brother   . Cancer Brother   . Diabetes Brother   . Hyperlipidemia Brother   . Hypertension Brother   . Diabetes Mother   . Hypertension Mother   . Hyperlipidemia Mother   . Heart attack Mother   . Hyperlipidemia Father   . Hypertension Father     SOCIAL HISTORY: Social History   Tobacco Use  . Smoking status: Former Smoker    Last  attempt to quit: 1994    Years since quitting: 25.1  . Smokeless tobacco: Former UNetwork engineerUse Topics  . Alcohol use: Yes    Alcohol/week: 0.0 oz    Comment: 2-8 times per year    Allergies  Allergen Reactions  . Dexilant [Dexlansoprazole]     Pressure in stomach and chest    Current Outpatient Medications  Medication Sig Dispense Refill  . amLODipine (NORVASC) 10 MG tablet Take 1 tablet (10 mg total) by mouth daily. 90 tablet 3  . aspirin 81 MG tablet Take 81 mg by mouth daily.    .Marland Kitchenatorvastatin (LIPITOR) 80 MG tablet Take 1 tablet (80 mg total) by mouth daily. 90 tablet 3  . Blood Glucose Monitoring Suppl (FREESTYLE FREEDOM LITE) w/Device KIT Check blood sugar once daily 1 each 0  . Blood Glucose Monitoring Suppl (FREESTYLE LITE) DEVI CHECK BLOOD SUGAR ONCE DAILY  0  . busPIRone (BUSPAR) 10 MG tablet Take 1 tablet (10 mg total) by mouth 2 (two) times daily. 180 tablet 3  . Cholecalciferol (VITAMIN D3) 2000 units TABS Take 2,000 Units by mouth daily.    . Coenzyme Q10 300 MG CAPS Take 300 mg by mouth daily.     . Cyanocobalamin (VITAMIN B-12 PO) Take 2,500 mg by mouth daily.    .Marland KitchenglipiZIDE (GLUCOTROL) 5 MG tablet Take 1 tablet (5 mg total) by mouth daily.  30 tablet 6  . glucose blood (FREESTYLE LITE) test strip Check blood sugar 1-3x daily 100 each 12  . glucose blood (FREESTYLE LITE) test strip CHECK BLOOD SUGAR ONCE DAILY 100 each 2  . Lancets (FREESTYLE) lancets Check blood sugar once daily 100 each 12  . lisinopril-hydrochlorothiazide (PRINZIDE,ZESTORETIC) 20-25 MG tablet Take 1 tablet by mouth daily. 90 tablet 3  . metFORMIN (GLUCOPHAGE) 1000 MG tablet Take 1 tablet (1,000 mg total) by mouth 2 (two) times daily with a meal. 180 tablet 3  . metoprolol succinate (TOPROL-XL) 100 MG 24 hr tablet Take 1 tablet (100 mg total) by mouth daily. Take with or immediately following a meal. 30 tablet 3  . MULTIPLE VITAMIN PO Take 1 tablet by mouth daily.     . niacin 500 MG tablet  Take 500 mg by mouth at bedtime.    . Omega-3 Fatty Acids (EQL OMEGA 3 FISH OIL) 1000 MG CAPS Take 1 capsule by mouth 2 (two) times daily.    Marland Kitchen OVER THE COUNTER MEDICATION Take 1 each by mouth daily. CBD Oil-1 dropperful daily    . pantoprazole (PROTONIX) 40 MG tablet Take 1 tablet (40 mg total) by mouth daily. 90 tablet 3  . ezetimibe (ZETIA) 10 MG tablet Take 1 tablet (10 mg total) by mouth daily. 90 tablet 3   No current facility-administered medications for this visit.     REVIEW OF SYSTEMS:  '[X]'  denotes positive finding, '[ ]'  denotes negative finding Cardiac  Comments:  Chest pain or chest pressure:    Shortness of breath upon exertion: x   Short of breath when lying flat:    Irregular heart rhythm:        Vascular    Pain in calf, thigh, or hip brought on by ambulation: x  bilateral  Pain in feet at night that wakes you up from your sleep:     Blood clot in your veins:    Leg swelling:         Pulmonary    Oxygen at home:    Productive cough:     Wheezing:         Neurologic    Sudden weakness in arms or legs:     Sudden numbness in arms or legs:     Sudden onset of difficulty speaking or slurred speech:    Temporary loss of vision in one eye:     Problems with dizziness:         Gastrointestinal    Blood in stool:     Vomited blood:         Genitourinary    Burning when urinating:     Blood in urine:        Psychiatric    Major depression:         Hematologic    Bleeding problems:    Problems with blood clotting too easily:        Skin    Rashes or ulcers:        Constitutional    Fever or chills:     PHYSICAL EXAM:   Vitals:   12/28/17 1428  BP: (!) 175/92  Pulse: 71  Resp: 20  Temp: 99.9 F (37.7 C)  TempSrc: Oral  SpO2: 96%  Weight: 291 lb (132 kg)  Height: '5\' 9"'  (1.753 m)    GENERAL: The patient is a well-nourished male, in no acute distress. The vital signs are documented above. CARDIAC: There is a regular rate and  rhythm.  VASCULAR:  I do not detect carotid bruits. I cannot palpate femoral, popliteal, or pedal pulses. Both feet appear adequately perfused. He has mild bilateral lower extremity swelling. PULMONARY: There is good air exchange bilaterally without wheezing or rales. ABDOMEN: Soft and non-tender with normal pitched bowel sounds.  MUSCULOSKELETAL: There are no major deformities or cyanosis. NEUROLOGIC: No focal weakness or paresthesias are detected. SKIN: There are no ulcers or rashes noted. PSYCHIATRIC: The patient has a normal affect.  DATA:    ARTERIAL DOPPLER STUDY: I have independently interpreted his arterial Doppler study today.  On the right side he has a biphasic dorsalis pedis signal with a monophasic posterior tibial signal.  ABIs 71%.  Toe pressure is 70 mmHg.  On the left side, there is a biphasic dorsalis pedis signal and posterior tibial signal.  ABIs 100% although this may be falsely elevated.  Toe pressure on the left is 95 mmHg.  MEDICAL ISSUES:   AORTOILIAC OCCLUSIVE DISEASE: This patient has known aortoiliac occlusive disease with not any great options for an endovascular approach.  If his symptoms progressed he would likely require aortofemoral bypass grafting.  Fortunately, his symptoms are gradually improving.  He seems very motivated.  He quit smoking 20 years ago.  He is on aspirin and is on a statin.  They recently joined a health club and are trying to get more consistent with her exercise.  We have also had a long discussion about the importance of nutrition and the importance of eating lots of vegetables.  I will see him back in 1 year with follow-up ABIs.  He knows to call sooner if he has problems.  Deitra Mayo [facility] [phone]

## 2018-01-07 ENCOUNTER — Other Ambulatory Visit: Payer: Self-pay | Admitting: Family Medicine

## 2018-01-07 DIAGNOSIS — I10 Essential (primary) hypertension: Secondary | ICD-10-CM

## 2018-02-23 IMAGING — CR DG CHEST 2V
2 series · 2 of 2 positions shown · non-contrast
Comparison: None.

CLINICAL DATA: Chest pain starting yesterday.

EXAM:
CHEST  2 VIEW

[w chest pa]
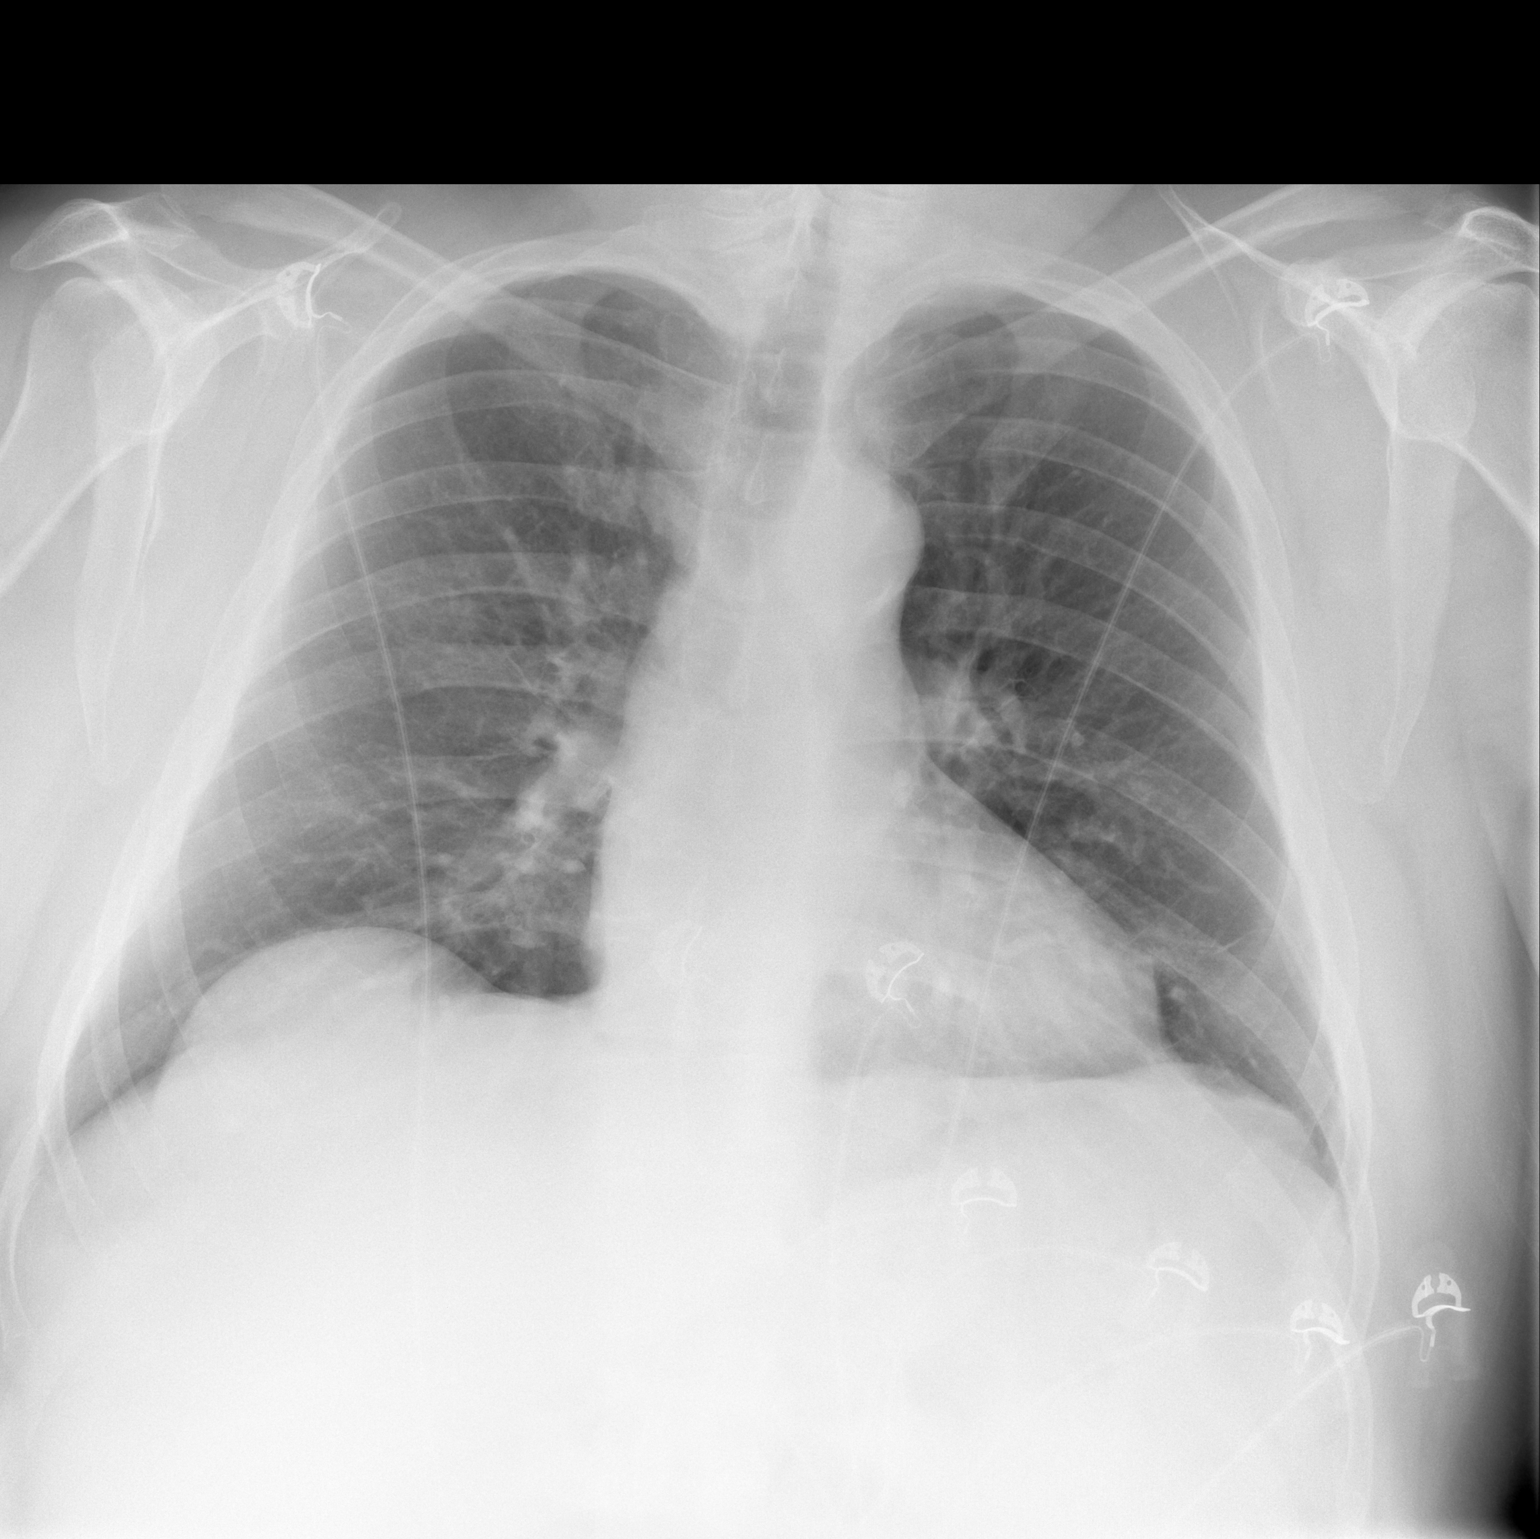

[w chest lat]
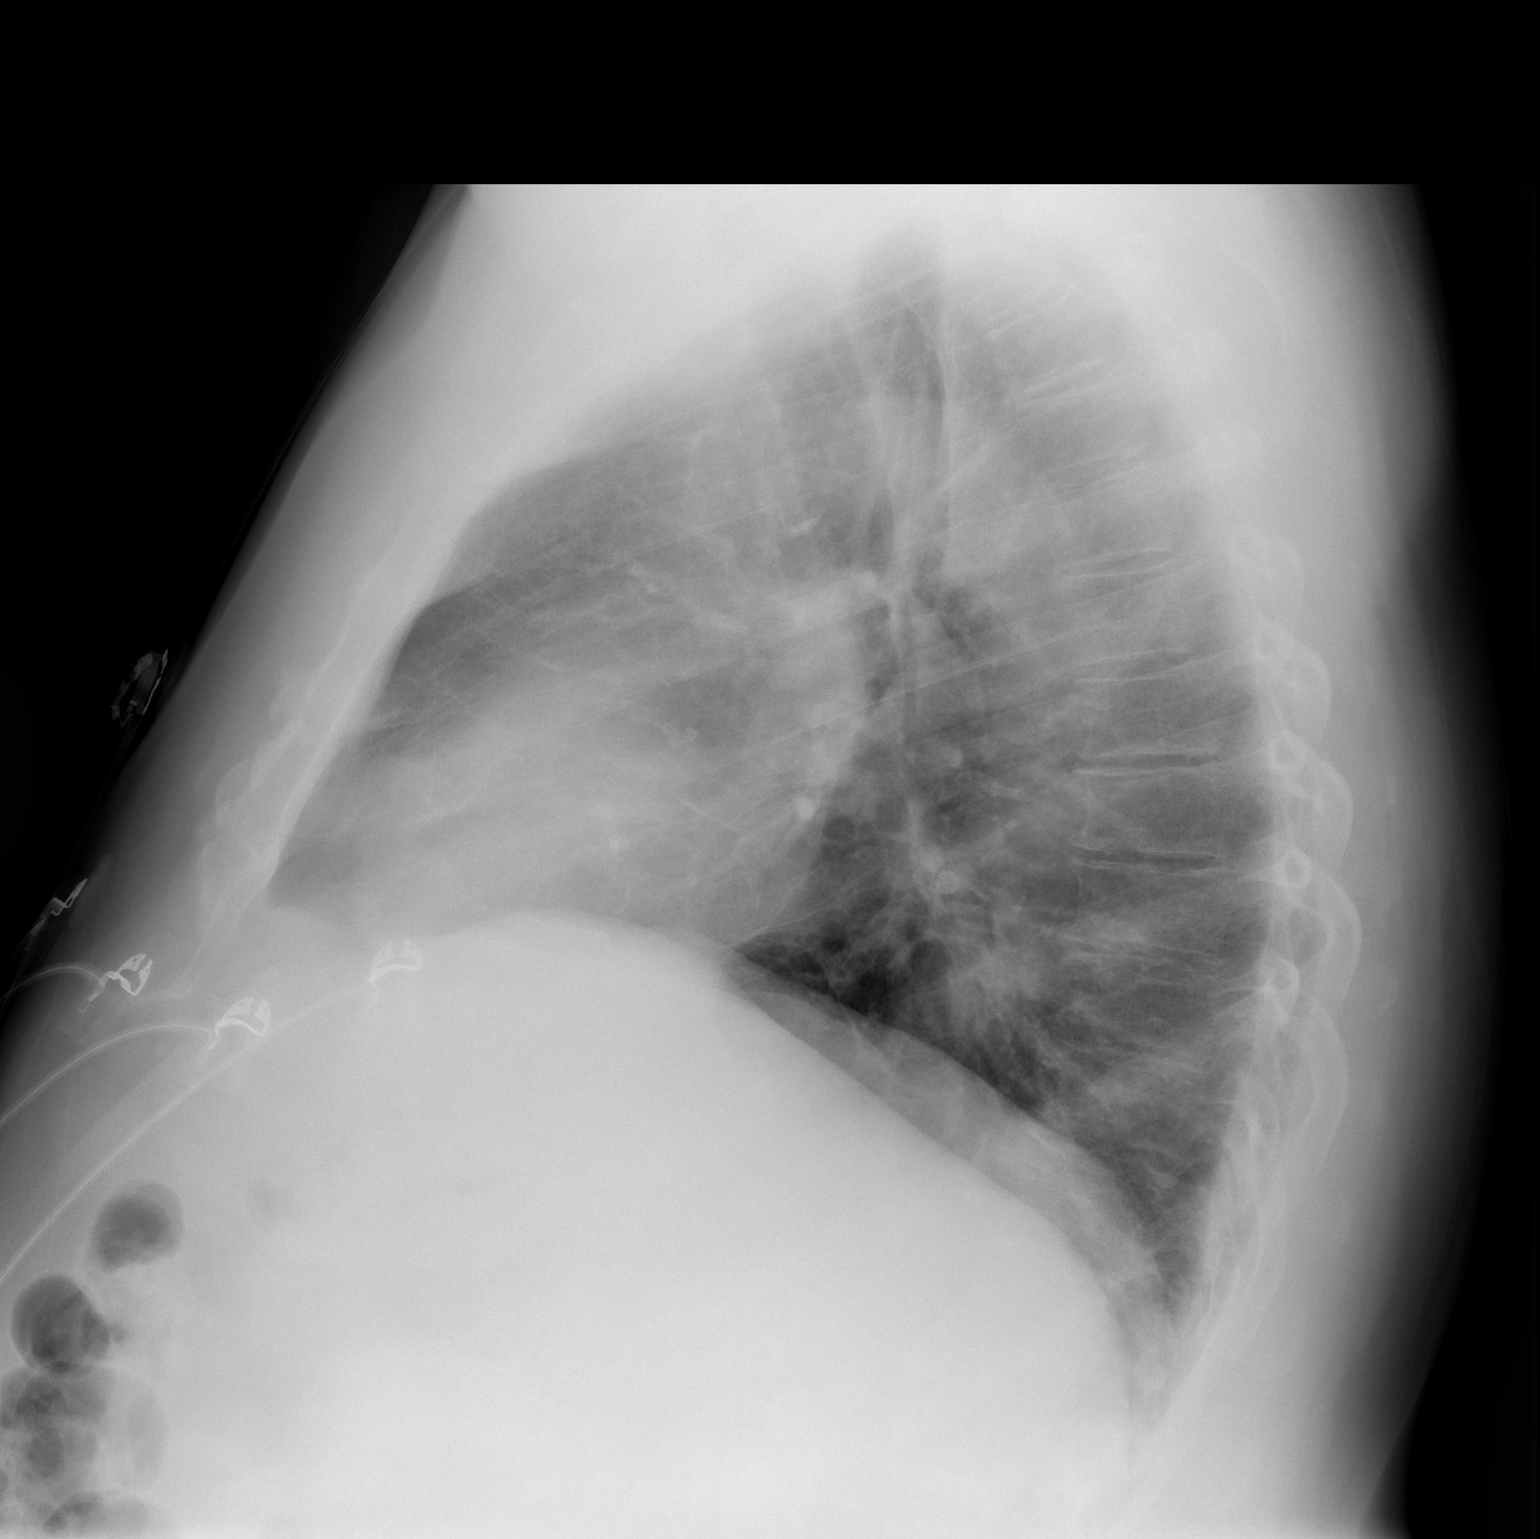

[2 of 2 positions shown; findings below may reference images not displayed]

FINDINGS: Atherosclerotic calcification of the aortic arch.

Heart size within normal limits. Mild lingular subsegmental
atelectasis or scarring. Slight lobularity of the right
hemidiaphragm.

No blunting of the costophrenic angles.  Mild thoracic spondylosis.
IMPRESSION: 1. Atherosclerotic aortic arch but no cardiomegaly or pulmonary
edema identified.
2. Subsegmental atelectasis or scarring in the lingula.

## 2018-03-03 ENCOUNTER — Other Ambulatory Visit: Payer: Self-pay | Admitting: Family Medicine

## 2018-03-08 ENCOUNTER — Other Ambulatory Visit: Payer: Self-pay | Admitting: Family Medicine

## 2018-03-21 DIAGNOSIS — R69 Illness, unspecified: Secondary | ICD-10-CM | POA: Diagnosis not present

## 2018-04-15 ENCOUNTER — Other Ambulatory Visit: Payer: Self-pay

## 2018-04-15 ENCOUNTER — Emergency Department (INDEPENDENT_AMBULATORY_CARE_PROVIDER_SITE_OTHER)
Admission: EM | Admit: 2018-04-15 | Discharge: 2018-04-15 | Disposition: A | Discharge status: Home / Self Care | Payer: Medicare HMO | Source: Home / Self Care | Attending: Family Medicine | Admitting: Family Medicine

## 2018-04-15 ENCOUNTER — Encounter: Payer: Self-pay | Admitting: Emergency Medicine

## 2018-04-15 ENCOUNTER — Emergency Department (INDEPENDENT_AMBULATORY_CARE_PROVIDER_SITE_OTHER): Payer: Medicare HMO

## 2018-04-15 DIAGNOSIS — M542 Cervicalgia: Secondary | ICD-10-CM

## 2018-04-15 DIAGNOSIS — M62838 Other muscle spasm: Secondary | ICD-10-CM | POA: Diagnosis not present

## 2018-04-15 MED ORDER — PREDNISONE 20 MG PO TABS: ORAL_TABLET | ORAL | 0 refills | Status: DC

## 2018-04-15 NOTE — ED Triage Notes (Signed)
67 y.o male presents c/o neck pain that radiates to bilateral shoulders. 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 3 days ago the pain began. He's used icyhot and heating pad with no relief.

## 2018-04-15 NOTE — Discharge Instructions (Addendum)
Apply ice pack for 20 to 30 minutes, 3 to 4 times daily  Continue until pain and swelling decrease. May take Tylenol as needed for pain.  Begin range of motion and stretching exercises as tolerated. °

## 2018-04-15 NOTE — ED Provider Notes (Signed)
[facility]    CSN: [id number] Arrival date & time: 04/15/18  1108     History   Chief Complaint Chief Complaint  Patient presents with  . Neck Pain    HPI David Singh is a 67 y.o. male.   Patient reports that one week ago he was on a very rough airplane ride, with a bumpy landing.  Four days ago he began to develop bilateral neck soreness that has gradually increased with radiation to both shoulders.  He denies paresthesias of his upper extremities.  The history is provided by the patient.  Neck Injury  This is a new problem. Episode onset: one week ago. The problem occurs constantly. The problem has been gradually worsening. Associated symptoms comments: Bilateral shoulder pain. Exacerbated by: neck movement. Nothing relieves the symptoms. Treatments tried: application of icy hot and heating pad. The treatment provided no relief.    Past Medical History:  Diagnosis Date  . CAD (coronary artery disease)   . Diabetes mellitus without complication ([place])   . GERD (gastroesophageal reflux disease)   . Hyperlipidemia   . Hypertension   . Peripheral vascular disease [facility])     Patient Active Problem List   Diagnosis Date Noted  . Chronic gout 03/09/2016  . Hypertriglyceridemia 01/16/2015  . Type 2 diabetes mellitus ([place]) 01/06/2015  . CAD in native artery 01/06/2015  . History of coronary artery stent placement 01/06/2015  . PAD (peripheral artery disease) ([place]) 01/06/2015  . Hyperlipidemia 01/06/2015  . Essential hypertension 01/06/2015    Past Surgical History:  Procedure Laterality Date  . ABDOMINAL AORTOGRAM W/LOWER EXTREMITY N/A 12/27/2016   Procedure: Abdominal Aortogram w/Lower Extremity;  Surgeon: Angelia Mould, MD;  Location: [facility];  Service: Cardiovascular;  Laterality: N/A;  . CARDIAC SURGERY    . CORONARY ANGIOPLASTY WITH STENT PLACEMENT    . MENISCUS REPAIR Right   . NASAL SINUS SURGERY    . TONSILLECTOMY          Home Medications    Prior to Admission medications   Medication Sig Start Date End Date Taking? Authorizing Provider  amLODipine (NORVASC) 10 MG tablet Take 1 tablet (10 mg total) by mouth daily. 10/06/17   Copland, Gay Filler, MD  aspirin 81 MG tablet Take 81 mg by mouth daily.    [provider]  atorvastatin (LIPITOR) 80 MG tablet Take 1 tablet (80 mg total) by mouth daily. 10/06/17   Copland, Gay Filler, MD  Blood Glucose Monitoring Suppl (FREESTYLE LITE) DEVI CHECK BLOOD SUGAR ONCE DAILY 01/19/17   [provider]  Blood Glucose Monitoring Suppl (FREESTYLE LITE) DEVI CHECK BLOOD SUGAR ONCE DAILY 03/08/18   Copland, Gay Filler, MD  busPIRone (BUSPAR) 10 MG tablet Take 1 tablet (10 mg total) by mouth 2 (two) times daily. 10/14/17   Copland, Gay Filler, MD  Cholecalciferol (VITAMIN D3) 2000 units TABS Take 2,000 Units by mouth daily.    [provider]  Coenzyme Q10 300 MG CAPS Take 300 mg by mouth daily.     [provider]  Cyanocobalamin (VITAMIN B-12 PO) Take 2,500 mg by mouth daily.    [provider]  ezetimibe (ZETIA) 10 MG tablet Take 1 tablet (10 mg total) by mouth daily. 01/19/17 04/19/17  Lelon Perla, MD  glipiZIDE (GLUCOTROL) 5 MG tablet TAKE 1 TABLET BY MOUTH EVERY DAY 03/03/18   Copland, Gay Filler, MD  glucose blood (FREESTYLE LITE) test strip Check blood sugar 1-3x daily 06/13/17  Copland, Gay Filler, MD  glucose blood (FREESTYLE LITE) test strip CHECK BLOOD SUGAR ONCE DAILY 11/21/17   Copland, Gay Filler, MD  Lancets (FREESTYLE) lancets Check blood sugar once daily 01/19/17   Copland, Gay Filler, MD  lisinopril-hydrochlorothiazide (PRINZIDE,ZESTORETIC) 20-25 MG tablet Take 1 tablet by mouth daily. 11/08/17   Copland, Gay Filler, MD  metFORMIN (GLUCOPHAGE) 1000 MG tablet Take 1 tablet (1,000 mg total) by mouth 2 (two) times daily with a meal. 10/06/17   Copland, Gay Filler, MD  metoprolol succinate (TOPROL-XL) 100 MG 24 hr tablet TAKE  1 TABLET (100 MG TOTAL) BY MOUTH DAILY. TAKE WITH OR IMMEDIATELY FOLLOWING A MEAL. 01/09/18   Copland, Gay Filler, MD  MULTIPLE VITAMIN PO Take 1 tablet by mouth daily.     [provider]  niacin 500 MG tablet Take 500 mg by mouth at bedtime.    [provider]  Omega-3 Fatty Acids (EQL OMEGA 3 FISH OIL) 1000 MG CAPS Take 1 capsule by mouth 2 (two) times daily.    [provider]  OVER THE COUNTER MEDICATION Take 1 each by mouth daily. CBD Oil-1 dropperful daily    [provider]  pantoprazole (PROTONIX) 40 MG tablet Take 1 tablet (40 mg total) by mouth daily. 10/06/17   Copland, Gay Filler, MD  predniSONE (DELTASONE) 20 MG tablet Take one tab by mouth twice daily for 4 days, then one daily for 3 days. Take with food. 04/15/18   Kandra Nicolas, MD    Family History Family History  Problem Relation Age of Onset  . Alcohol abuse Brother   . Cancer Brother   . Diabetes Brother   . Hyperlipidemia Brother   . Hypertension Brother   . Diabetes Mother   . Hypertension Mother   . Hyperlipidemia Mother   . Heart attack Mother   . Hyperlipidemia Father   . Hypertension Father     Social History Social History   Tobacco Use  . Smoking status: Former Smoker    Last attempt to quit: 1994    Years since quitting: 25.4  . Smokeless tobacco: Former Network engineer Use Topics  . Alcohol use: Yes    Alcohol/week: 0.0 oz    Comment: 2-8 times per year  . Drug use: No     Allergies   Dexilant [dexlansoprazole]   Review of Systems Review of Systems  All other systems reviewed and are negative.    Physical Exam Triage Vital Signs ED Triage Vitals  Enc Vitals Group     BP 04/15/18 1130 (!) 158/88     Pulse Rate 04/15/18 1130 76     Resp --      Temp 04/15/18 1130 (!) 97.5 F (36.4 C)     Temp Source 04/15/18 1130 Oral     SpO2 04/15/18 1130 99 %     Weight 04/15/18 1131 286 lb (129.7 kg)     Height 04/15/18 1131 5\' 9"  (1.753 m)     Head  Circumference --      Peak Flow --      Pain Score 04/15/18 1131 8     Pain Loc --      Pain Edu? --      Excl. in [place]? --    No data found.  Updated Vital Signs BP (!) 158/88 (BP Location: Left Arm)   Pulse 76   Temp (!) 97.5 F (36.4 C) (Oral)   Ht 5\' 9"  (1.753 m)   Wt 286  lb (129.7 kg)   SpO2 99%   BMI 42.23 kg/m   Visual Acuity Right Eye Distance:   Left Eye Distance:   Bilateral Distance:    Right Eye Near:   Left Eye Near:    Bilateral Near:     Physical Exam  Constitutional: He appears well-developed and well-nourished. No distress.  HENT:  Head: Normocephalic.  Right Ear: External ear normal.  Left Ear: External ear normal.  Nose: Nose normal.  Mouth/Throat: Oropharynx is clear and moist.  Eyes: Pupils are equal, round, and reactive to light. Conjunctivae are normal.  Neck: Neck supple. Muscular tenderness present. No spinous process tenderness present. Decreased range of motion present.    Neck has decreased range of motion.  There is bilateral tenderness to palpation over trapezius and sternocleidomastoid muscles.  Patient's pain is worse when he flexes his neck to the right.  Cardiovascular: Normal heart sounds.  Pulmonary/Chest: Breath sounds normal.  Musculoskeletal: He exhibits no edema.  Lymphadenopathy:    He has no cervical adenopathy.  Neurological: He is alert.  Skin: Skin is warm and dry. No rash noted.  Nursing note and vitals reviewed.    UC Treatments / Results  Labs (all labs ordered are listed, but only abnormal results are displayed) Labs Reviewed - No data to display  EKG None  Radiology Dg Cervical Spine Complete  Result Date: 04/15/2018 CLINICAL DATA:  Bilateral neck pain for the past 4 days after rough airplane flight. EXAM: CERVICAL SPINE - COMPLETE 4+ VIEW COMPARISON:  None. FINDINGS: The lateral view is diagnostic to the C5-C6 level. There is no acute fracture or subluxation. Vertebral body heights are preserved.  Alignment is normal. Mild disc height loss and facet uncovertebral hypertrophy throughout the cervical spine. Mild left-sided neuroforaminal stenosis from C3-C4 through C6-C7. The right neural foramina are patent.Normal prevertebral soft tissues. Bilateral carotid artery calcific atherosclerosis. IMPRESSION: 1.  No acute osseous abnormality. 2. Mild multilevel degenerative changes throughout the cervical spine. Electronically Signed   By: Titus Dubin M.D.   On: 04/15/2018 12:22    Procedures Procedures (including critical care time)  Medications Ordered in UC Medications - No data to display  Initial Impression / Assessment and Plan / UC Course  I have reviewed the triage vital signs and the nursing notes.  Pertinent labs & imaging results that were available during my care of the patient were reviewed by me and considered in my medical decision making (see chart for details).    Begin prednisone burst/taper. Followup with Dr. Aundria Mems or Dr. Lynne Leader ([facility]) if not improving about two weeks.    Final Clinical Impressions(s) / UC Diagnoses   Final diagnoses:  Neck muscle spasm     Discharge Instructions     Apply ice pack for 20 to 30 minutes, 3 to 4 times daily  Continue until pain and swelling decrease. May take Tylenol as needed for pain.  Begin range of motion and stretching exercises as tolerated.    ED Prescriptions    Medication Sig Dispense Auth. Provider   predniSONE (DELTASONE) 20 MG tablet Take one tab by mouth twice daily for 4 days, then one daily for 3 days. Take with food. 11 tablet Kandra Nicolas, MD        Kandra Nicolas, MD 04/17/18 [phone]

## 2018-05-08 ENCOUNTER — Encounter: Payer: Self-pay | Admitting: Family Medicine

## 2018-05-08 DIAGNOSIS — I10 Essential (primary) hypertension: Secondary | ICD-10-CM

## 2018-05-09 MED ORDER — LISINOPRIL 10 MG PO TABS: 10.0000 mg | ORAL_TABLET | Freq: Every day | ORAL | 5 refills | Status: DC

## 2018-05-18 NOTE — Progress Notes (Signed)
[facility] [address], [place], [place] [address] [phone]  Date:  05/22/2018   Name:  David Singh   DOB:  June 17, 1951   MRN:  [id number]  PCP:  Darreld Mclean, MD    Chief Complaint: Hypertension (fasting blood work, brought BP log) and Weight Loss (starting to lose weight, joined clinical trial-2 weeks ago 296lb)   History of Present Illness:  David Singh is a 67 y.o. very pleasant male patient who presents with the following:  Following up today History of DM, CAD, HTN, PAD, hyperlipidemia   Last seen by myself in December for a recheck visit Lab Results  Component Value Date   HGBA1C 7.2 (H) 05/22/2018   Pneumovax due- he had prevnar in 2018, over a year ago. Will do pneumovax today  He is seeing Dr. Scot Singh with vascular surgery saw him in March.  He can walk 40- 50 yards before his pain starts in his legs.  This is frustrating to him as he would like to exercise to assist with weight loss  From his last vascular note from March:  David Singh: This patient has known aortoiliac occlusive disease with not any great options for an endovascular approach.  If his symptoms progressed he would likely require aortofemoral bypass grafting.  Fortunately, his symptoms are gradually improving.  He seems very motivated.  He quit smoking 20 years ago.  He is on aspirin and is on a statin.  They recently joined a health club and are trying to get more consistent with her exercise.  We have also had a long discussion about the importance of nutrition and the importance of eating lots of vegetables.  I will see him back in 1 year with follow-up ABIs.  He is not having any SE from the lipitor that he can tell He is in a clinical trial for high cholesterol - he went for this w/u recently and was shocked to see his weight at nearly 300 lbs He started a diet on 7/16- so far he is doing well and has lost  several lbs, he is feeling quite inspired and detemined   He has cut down a lot on carbs  His urologist is [place]- last seen there in January  His PSA has been ok as below:  Lab Results  Component Value Date   PSA 0.68 10/06/2017   PSA 0.91 12/09/2016   PSA 1.08 11/05/2015    Wt Readings from Last 3 Encounters:  05/22/18 288 lb (130.6 kg)  04/15/18 286 lb (129.7 kg)  12/28/17 291 lb (132 kg)   He has already lost some weight from his high of 296  BP Readings from Last 3 Encounters:  05/22/18 124/80  04/15/18 (!) 158/88  12/28/17 (!) 175/92   His home BP readings have been good   For DM she is taking glucotrol, metformin  Patient Active Problem List   Diagnosis Date Noted  . Chronic gout 03/09/2016  . Hypertriglyceridemia 01/16/2015  . Type 2 diabetes mellitus ([place]) 01/06/2015  . CAD in native artery 01/06/2015  . History of coronary artery stent placement 01/06/2015  . PAD (peripheral artery disease) ([place]) 01/06/2015  . Hyperlipidemia 01/06/2015  . Essential hypertension 01/06/2015    Past Medical History:  Diagnosis Date  . CAD (coronary artery disease)   . Diabetes mellitus without complication ([place])   . GERD (gastroesophageal reflux disease)   . Hyperlipidemia   .  Hypertension   . Peripheral vascular disease [place])     Past Surgical History:  Procedure Laterality Date  . ABDOMINAL AORTOGRAM W/LOWER EXTREMITY N/A 12/27/2016   Procedure: Abdominal Aortogram w/Lower Extremity;  Surgeon: Angelia Mould, MD;  Location: [facility];  Service: Cardiovascular;  Laterality: N/A;  . CARDIAC SURGERY    . CORONARY ANGIOPLASTY WITH STENT PLACEMENT    . MENISCUS REPAIR Right   . NASAL SINUS SURGERY    . TONSILLECTOMY      Social History   Tobacco Use  . Smoking status: Former Smoker    Last attempt to quit: 1994    Years since quitting: 25.5  . Smokeless tobacco: Former Network engineer Use Topics  . Alcohol use: Yes    Alcohol/week: 0.0 oz     Comment: 2-8 times per year  . Drug use: No    Family History  Problem Relation Age of Onset  . Alcohol abuse Brother   . Cancer Brother   . Diabetes Brother   . Hyperlipidemia Brother   . Hypertension Brother   . Diabetes Mother   . Hypertension Mother   . Hyperlipidemia Mother   . Heart attack Mother   . Hyperlipidemia Father   . Hypertension Father     Allergies  Allergen Reactions  . Dexilant [Dexlansoprazole]     Pressure in stomach and chest    Medication list has been reviewed and updated.  Current Outpatient Medications on File Prior to Visit  Medication Sig Dispense Refill  . amLODipine (NORVASC) 10 MG tablet Take 1 tablet (10 mg total) by mouth daily. 90 tablet 3  . aspirin 81 MG tablet Take 81 mg by mouth daily.    Marland Kitchen atorvastatin (LIPITOR) 80 MG tablet Take 1 tablet (80 mg total) by mouth daily. 90 tablet 3  . Blood Glucose Monitoring Suppl (FREESTYLE LITE) DEVI CHECK BLOOD SUGAR ONCE DAILY  0  . Blood Glucose Monitoring Suppl (FREESTYLE LITE) DEVI CHECK BLOOD SUGAR ONCE DAILY 1 each 0  . busPIRone (BUSPAR) 10 MG tablet Take 1 tablet (10 mg total) by mouth 2 (two) times daily. 180 tablet 3  . Cholecalciferol (VITAMIN D3) 2000 units TABS Take 2,000 Units by mouth daily.    . Coenzyme Q10 300 MG CAPS Take 300 mg by mouth daily.     . Cyanocobalamin (VITAMIN B-12 PO) Take 2,500 mg by mouth daily.    Marland Kitchen glipiZIDE (GLUCOTROL) 5 MG tablet TAKE 1 TABLET BY MOUTH EVERY DAY 30 tablet 0  . glucose blood (FREESTYLE LITE) test strip Check blood sugar 1-3x daily 100 each 12  . glucose blood (FREESTYLE LITE) test strip CHECK BLOOD SUGAR ONCE DAILY 100 each 2  . Lancets (FREESTYLE) lancets Check blood sugar once daily 100 each 12  . lisinopril (PRINIVIL,ZESTRIL) 10 MG tablet Take 1 tablet (10 mg total) by mouth daily. 30 tablet 5  . lisinopril-hydrochlorothiazide (PRINZIDE,ZESTORETIC) 20-25 MG tablet Take 1 tablet by mouth daily. 90 tablet 3  . metFORMIN (GLUCOPHAGE) 1000 MG  tablet Take 1 tablet (1,000 mg total) by mouth 2 (two) times daily with a meal. 180 tablet 3  . metoprolol succinate (TOPROL-XL) 100 MG 24 hr tablet TAKE 1 TABLET (100 MG TOTAL) BY MOUTH DAILY. TAKE WITH OR IMMEDIATELY FOLLOWING A MEAL. 30 tablet 3  . MULTIPLE VITAMIN PO Take 1 tablet by mouth daily.     . niacin 500 MG tablet Take 500 mg by mouth at bedtime.    . Omega-3 Fatty  Acids (EQL OMEGA 3 FISH OIL) 1000 MG CAPS Take 1 capsule by mouth 2 (two) times daily.    [place] Kitchen OVER THE COUNTER MEDICATION Take 1 each by mouth daily. CBD Oil-1 dropperful daily    . pantoprazole (PROTONIX) 40 MG tablet Take 1 tablet (40 mg total) by mouth daily. 90 tablet 3  . UNABLE TO FIND Take 500 mcg by mouth daily. Med Name: Vitamin K2    . UNABLE TO FIND Med Name: Natural MK-7-100 MCG WITH MK-4-100MG     . ezetimibe (ZETIA) 10 MG tablet Take 1 tablet (10 mg total) by mouth daily. 90 tablet 3   No current facility-administered medications on file prior to visit.     Review of Systems:  As per HPI- otherwise negative. No fever or chills No chest pain or SOB No rash, nausea or vomiting   Physical Examination: Vitals:   05/22/18 0945  BP: 124/80  Pulse: 76  Resp: (!) 98   Vitals:   05/22/18 0945  Weight: 288 lb (130.6 kg)  Height: 5\' 9"  (1.753 m)   Body mass index is 42.53 kg/m. Ideal Body Weight: Weight in (lb) to have BMI = 25: 168.9  GEN: WDWN, NAD, Non-toxic, A & O x 3, obese, looks well today HEENT: Atraumatic, Normocephalic. Neck supple. No masses, No LAD. Ears and Nose: No external deformity. CV: RRR, No M/G/R. No JVD. No thrill. No extra heart sounds. PULM: CTA B, no wheezes, crackles, rhonchi. No retractions. No resp. distress. No accessory muscle use. ABD: S, NT, ND, +BS. No rebound. No HSM. EXTR: No c/c/e NEURO Normal gait.  PSYCH: Normally interactive. Conversant. Not depressed or anxious appearing.  Calm demeanor.    Assessment and Plan: Essential hypertension  Type 2 diabetes  mellitus without complication, without long-term current use of insulin ([place]) - Plan: Hemoglobin E1D, Basic metabolic panel  Dyslipidemia  Immunization due - Plan: Pneumococcal polysaccharide vaccine 23-valent greater than or equal to 2yo subcutaneous/IM  Extremity atherosclerosis with intermittent claudication (H[facility])  PAD (peripheral artery disease) ([place])  Following up today- BP is under fine control on current regimen Await A1c and will be back in touch with him Gave 2nd pneumonia shot today  Discussed his PAD, encouraged him to try and exercise as well as he can, and perhaps to use a hand bike or other non- leg exercise routine  Encouraged him to continue to work on weight loss  Signed Lamar Blinks, MD   Results for orders placed or performed in visit on 05/22/18  Hemoglobin A1c  Result Value Ref Range   Hgb A1c MFr Bld 7.2 (H) 4.6 - 6.5 %  Basic metabolic panel  Result Value Ref Range   Sodium 132 (L) 135 - 145 mEq/L   Potassium 4.2 3.5 - 5.1 mEq/L   Chloride 94 (L) 96 - 112 mEq/L   CO2 27 19 - 32 mEq/L   Glucose, Bld 135 (H) 70 - 99 mg/dL   BUN 11 6 - 23 mg/dL   Creatinine, Ser 0.81 0.40 - 1.50 mg/dL   Calcium 9.6 8.4 - 10.5 mg/dL   GFR 101.20 >60.00 mL/min   Message to pt regarding his labs as well

## 2018-05-22 ENCOUNTER — Encounter: Payer: Self-pay | Admitting: Family Medicine

## 2018-05-22 ENCOUNTER — Ambulatory Visit (INDEPENDENT_AMBULATORY_CARE_PROVIDER_SITE_OTHER): Payer: Medicare HMO | Admitting: Family Medicine

## 2018-05-22 VITALS — BP 124/80 | HR 76 | Ht 69.0 in | Wt 288.0 lb

## 2018-05-22 DIAGNOSIS — E119 Type 2 diabetes mellitus without complications: Secondary | ICD-10-CM

## 2018-05-22 DIAGNOSIS — I10 Essential (primary) hypertension: Secondary | ICD-10-CM

## 2018-05-22 DIAGNOSIS — I739 Peripheral vascular disease, unspecified: Secondary | ICD-10-CM | POA: Diagnosis not present

## 2018-05-22 DIAGNOSIS — I70219 Atherosclerosis of native arteries of extremities with intermittent claudication, unspecified extremity: Secondary | ICD-10-CM | POA: Diagnosis not present

## 2018-05-22 DIAGNOSIS — Z6841 Body Mass Index (BMI) 40.0 and over, adult: Secondary | ICD-10-CM | POA: Diagnosis not present

## 2018-05-22 DIAGNOSIS — E785 Hyperlipidemia, unspecified: Secondary | ICD-10-CM | POA: Diagnosis not present

## 2018-05-22 DIAGNOSIS — E871 Hypo-osmolality and hyponatremia: Secondary | ICD-10-CM

## 2018-05-22 DIAGNOSIS — Z23 Encounter for immunization: Secondary | ICD-10-CM | POA: Diagnosis not present

## 2018-05-22 LAB — BASIC METABOLIC PANEL
BUN: 11 mg/dL (ref 6–23)
CALCIUM: 9.6 mg/dL (ref 8.4–10.5)
CO2: 27 meq/L (ref 19–32)
Chloride: 94 mEq/L — ABNORMAL LOW (ref 96–112)
Creatinine, Ser: 0.81 mg/dL (ref 0.40–1.50)
GFR: 101.2 mL/min (ref >60.00)
Glucose, Bld: 135 mg/dL — ABNORMAL HIGH (ref 70–99)
Potassium: 4.2 mEq/L (ref 3.5–5.1)
SODIUM: 132 meq/L — AB (ref 135–145)

## 2018-05-22 LAB — HEMOGLOBIN A1C: HEMOGLOBIN A1C: 7.2 % — AB (ref 4.6–6.5)

## 2018-05-22 NOTE — Patient Instructions (Addendum)
It was great to see you today- best of luck with your continued weight loss.  We will check your A1c and BMP today- I will be in touch with your results asap Continue to exercise as well as you are able- use your arms as much as you can Your BP looks fine today You got your 2nd pneumonia vaccine today- pneumovax

## 2018-06-01 DIAGNOSIS — R351 Nocturia: Secondary | ICD-10-CM | POA: Diagnosis not present

## 2018-06-01 DIAGNOSIS — N401 Enlarged prostate with lower urinary tract symptoms: Secondary | ICD-10-CM | POA: Diagnosis not present

## 2018-06-01 DIAGNOSIS — R3915 Urgency of urination: Secondary | ICD-10-CM | POA: Diagnosis not present

## 2018-06-15 NOTE — Progress Notes (Signed)
HPI: FU CAD. Patient had PCI in [place] previously. No records available. Patient is followed by vascular surgery for peripheral vascular disease. Angiogram March 2018 showed no renal artery stenosis. 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 There was reconstitution of the distal right common iliac artery it was felt medical therapy indicated.   Nuclear study April 2018 showed ejection fraction 54% with no ischemia or infarction.  ABIs March 2019 showed moderate disease on the right and normal on the left.  Since last seen, patient denies dyspnea, chest pain, palpitations or syncope.  He does have claudication in his right lower extremity.  Current Outpatient Medications  Medication Sig Dispense Refill  . amLODipine (NORVASC) 10 MG tablet Take 1 tablet (10 mg total) by mouth daily. 90 tablet 3  . aspirin 81 MG tablet Take 81 mg by mouth daily.    Marland Kitchen atorvastatin (LIPITOR) 80 MG tablet Take 1 tablet (80 mg total) by mouth daily. 90 tablet 3  . Blood Glucose Monitoring Suppl (FREESTYLE LITE) DEVI CHECK BLOOD SUGAR ONCE DAILY  0  . Blood Glucose Monitoring Suppl (FREESTYLE LITE) DEVI CHECK BLOOD SUGAR ONCE DAILY 1 each 0  . busPIRone (BUSPAR) 10 MG tablet Take 1 tablet (10 mg total) by mouth 2 (two) times daily. 180 tablet 3  . Cholecalciferol (VITAMIN D3) 2000 units TABS Take 2,000 Units by mouth daily.    . Coenzyme Q10 300 MG CAPS Take 300 mg by mouth daily.     . Cyanocobalamin (VITAMIN B-12 PO) Take 2,500 mg by mouth daily.    Marland Kitchen glipiZIDE (GLUCOTROL) 5 MG tablet TAKE 1 TABLET BY MOUTH EVERY DAY 30 tablet 0  . glucose blood (FREESTYLE LITE) test strip Check blood sugar 1-3x daily 100 each 12  . glucose blood (FREESTYLE LITE) test strip CHECK BLOOD SUGAR ONCE DAILY 100 each 2  . Lancets (FREESTYLE) lancets Check blood sugar once daily 100 each 12  . lisinopril (PRINIVIL,ZESTRIL) 10 MG tablet Take 1 tablet (10 mg total) by  mouth daily. 30 tablet 5  . lisinopril-hydrochlorothiazide (PRINZIDE,ZESTORETIC) 20-25 MG tablet Take 1 tablet by mouth daily. 90 tablet 3  . metFORMIN (GLUCOPHAGE) 1000 MG tablet Take 1 tablet (1,000 mg total) by mouth 2 (two) times daily with a meal. 180 tablet 3  . metoprolol succinate (TOPROL-XL) 100 MG 24 hr tablet TAKE 1 TABLET (100 MG TOTAL) BY MOUTH DAILY. TAKE WITH OR IMMEDIATELY FOLLOWING A MEAL. 30 tablet 3  . MULTIPLE VITAMIN PO Take 1 tablet by mouth daily.     . niacin 500 MG tablet Take 500 mg by mouth at bedtime.    . Omega-3 Fatty Acids (EQL OMEGA 3 FISH OIL) 1000 MG CAPS Take 1 capsule by mouth 2 (two) times daily.    Marland Kitchen OVER THE COUNTER MEDICATION Take 1 each by mouth daily. CBD Oil-1 dropperful daily    . pantoprazole (PROTONIX) 40 MG tablet Take 1 tablet (40 mg total) by mouth daily. 90 tablet 3  . UNABLE TO FIND Take 500 mcg by mouth daily. Med Name: Vitamin K2    . UNABLE TO FIND Med Name: Natural MK-7-100 MCG WITH MK-4-100MG     . ezetimibe (ZETIA) 10 MG tablet Take 1 tablet (10 mg total) by mouth daily. 90 tablet 3   No current facility-administered medications for this visit.      Past Medical History:  Diagnosis Date  . CAD (coronary artery disease)   .  Diabetes mellitus without complication ([place])   . GERD (gastroesophageal reflux disease)   . Hyperlipidemia   . Hypertension   . Peripheral vascular disease [facility])     Past Surgical History:  Procedure Laterality Date  . ABDOMINAL AORTOGRAM W/LOWER EXTREMITY N/A 12/27/2016   Procedure: Abdominal Aortogram w/Lower Extremity;  Surgeon: Angelia Mould, MD;  Location: [facility];  Service: Cardiovascular;  Laterality: N/A;  . CARDIAC SURGERY    . CORONARY ANGIOPLASTY WITH STENT PLACEMENT    . MENISCUS REPAIR Right   . NASAL SINUS SURGERY    . TONSILLECTOMY      Social History   Socioeconomic History  . Marital status: Married    Spouse name: Not on file  . Number of children: 2  . Years of  education: Not on file  . Highest education level: Not on file  Occupational History  . Not on file  Social Needs  . Financial resource strain: Not on file  . Food insecurity:    Worry: Not on file    Inability: Not on file  . Transportation needs:    Medical: Not on file    Non-medical: Not on file  Tobacco Use  . Smoking status: Former Smoker    Last attempt to quit: 1994    Years since quitting: 25.6  . Smokeless tobacco: Former Network engineer and Sexual Activity  . Alcohol use: Yes    Alcohol/week: 0.0 standard drinks    Comment: 2-8 times per year  . Drug use: No  . Sexual activity: Yes  Lifestyle  . Physical activity:    Days per week: Not on file    Minutes per session: Not on file  . Stress: Not on file  Relationships  . Social connections:    Talks on phone: Not on file    Gets together: Not on file    Attends religious service: Not on file    Active member of club or organization: Not on file    Attends meetings of clubs or organizations: Not on file    Relationship status: Not on file  . Intimate partner violence:    Fear of current or ex partner: Not on file    Emotionally abused: Not on file    Physically abused: Not on file    Forced sexual activity: Not on file  Other Topics Concern  . Not on file  Social History Narrative  . Not on file    Family History  Problem Relation Age of Onset  . Alcohol abuse Brother   . Cancer Brother   . Diabetes Brother   . Hyperlipidemia Brother   . Hypertension Brother   . Diabetes Mother   . Hypertension Mother   . Hyperlipidemia Mother   . Heart attack Mother   . Hyperlipidemia Father   . Hypertension Father     ROS: no fevers or chills, productive cough, hemoptysis, dysphasia, odynophagia, melena, hematochezia, dysuria, hematuria, rash, seizure activity, orthopnea, PND, pedal edema. Remaining systems are negative.  Physical Exam: Well-developed obese in no acute distress.  Skin is warm and dry.    HEENT is normal.  Neck is supple.  Chest is clear to auscultation with normal expansion.  Cardiovascular exam is regular rate and rhythm.  Abdominal exam nontender or distended. No masses palpated. Extremities show no edema. neuro grossly intact  ECG-sinus rhythm at a rate of 69.  No ST changes.  Personally reviewed  A/P  1 coronary artery disease-plan to  continue medical therapy with aspirin and statin.  Most recent nuclear study showed no significant ischemia.  2 hypertension-patient's blood pressure is controlled this morning.  Continue present medications and follow.  3 hyperlipidemia-continue statin.  Check lipids and liver.  4 peripheral vascular disease-patient is followed by vascular surgery.  Continue medical therapy with aspirin and statin.  5 obesity-we again discussed the importance of weight loss, diet and exercise.  Kirk Ruths, MD

## 2018-06-20 ENCOUNTER — Encounter: Payer: Self-pay | Admitting: Cardiology

## 2018-06-21 ENCOUNTER — Encounter: Payer: Self-pay | Admitting: Cardiology

## 2018-06-21 ENCOUNTER — Ambulatory Visit: Payer: Medicare HMO | Admitting: Cardiology

## 2018-06-21 VITALS — BP 136/82 | HR 69 | Ht 69.0 in | Wt 282.1 lb

## 2018-06-21 DIAGNOSIS — I739 Peripheral vascular disease, unspecified: Secondary | ICD-10-CM | POA: Diagnosis not present

## 2018-06-21 DIAGNOSIS — E7800 Pure hypercholesterolemia, unspecified: Secondary | ICD-10-CM | POA: Diagnosis not present

## 2018-06-21 DIAGNOSIS — I2510 Atherosclerotic heart disease of native coronary artery without angina pectoris: Secondary | ICD-10-CM | POA: Diagnosis not present

## 2018-06-21 DIAGNOSIS — I10 Essential (primary) hypertension: Secondary | ICD-10-CM

## 2018-06-21 NOTE — Patient Instructions (Signed)
Medication Instructions:   NO CHANGE  Labwork:  Your physician recommends that you HAVE LAB WORK TODAY  Follow-Up:  Your physician wants you to follow-up in: ONE YEAR WITH DR CRENSHAW You will receive a reminder letter in the mail two months in advance. If you don't receive a letter, please call our office to schedule the follow-up appointment.   If you need a refill on your cardiac medications before your next appointment, please call your pharmacy.    

## 2018-06-22 LAB — HEPATIC FUNCTION PANEL
ALT: 35 IU/L (ref 0–44)
AST: 26 IU/L (ref 0–40)
Albumin: 4.6 g/dL (ref 3.6–4.8)
Alkaline Phosphatase: 51 IU/L (ref 39–117)
Bilirubin Total: 0.4 mg/dL (ref 0.0–1.2)
Bilirubin, Direct: 0.14 mg/dL (ref 0.00–0.40)
Total Protein: 7 g/dL (ref 6.0–8.5)

## 2018-06-22 LAB — LIPID PANEL
Chol/HDL Ratio: 4.6 ratio (ref 0.0–5.0)
Cholesterol, Total: 134 mg/dL (ref 100–199)
HDL: 29 mg/dL — ABNORMAL LOW (ref >39)
LDL Calculated: 67 mg/dL (ref 0–99)
TRIGLYCERIDES: 188 mg/dL — AB (ref 0–149)
VLDL CHOLESTEROL CAL: 38 mg/dL (ref 5–40)

## 2018-06-25 DIAGNOSIS — R69 Illness, unspecified: Secondary | ICD-10-CM | POA: Diagnosis not present

## 2018-07-03 ENCOUNTER — Encounter: Payer: Self-pay | Admitting: Family Medicine

## 2018-07-04 ENCOUNTER — Other Ambulatory Visit: Payer: Self-pay | Admitting: Family Medicine

## 2018-07-04 DIAGNOSIS — I10 Essential (primary) hypertension: Secondary | ICD-10-CM

## 2018-07-05 NOTE — Progress Notes (Deleted)
[facility] [address] [place], [place] [address] [phone]  Date:  07/10/2018   Name:  David Singh   DOB:  14-Feb-1951   MRN:  [id number]  PCP:  Darreld Mclean, MD    Chief Complaint: No chief complaint on file.   History of Present Illness:  David Singh is a 67 y.o. very pleasant male patient who presents with the following:  History of DM, CAD with stent, dyslipidemia, gout, obesity He also has severe exercise limiting PAD and is a vascular surgery patient Last seen by myself just in July, and he also recently saw cardiology- [place]:  HPI: FU CAD.Patient had PCI in [place] previously. No records available. Patient is followed by vascular surgery for peripheral vascular disease. Angiogram March 2018 showed no renal artery stenosis. 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 There was reconstitution of the distal right common iliac artery it was felt medical therapy indicated.  Nuclear study April 2018 showed ejection fraction 54% with no ischemia or infarction.  ABIs March 2019 showed moderate disease on the right and normal on the left.  Since last seen, patient denies dyspnea, chest pain, palpitations or syncope.  He does have claudication in his right lower extremity./////////////////////////////////////// 1 coronary artery disease-plan to continue medical therapy with aspirin and statin.  Most recent nuclear study showed no significant ischemia. 2 hypertension-patient's blood pressure is controlled this morning.  Continue present medications and follow. 3 hyperlipidemia-continue statin.  Check lipids and liver. 4 peripheral vascular disease-patient is followed by vascular surgery.  Continue medical therapy with aspirin and statin. 5 obesity-we again discussed the importance of weight loss, diet and exercise.  Flu:  Patient Active  Problem List   Diagnosis Date Noted  . Chronic gout 03/09/2016  . Hypertriglyceridemia 01/16/2015  . Type 2 diabetes mellitus ([place]) 01/06/2015  . CAD in native artery 01/06/2015  . History of coronary artery stent placement 01/06/2015  . PAD (peripheral artery disease) ([place]) 01/06/2015  . Hyperlipidemia 01/06/2015  . Essential hypertension 01/06/2015    Past Medical History:  Diagnosis Date  . CAD (coronary artery disease)   . Diabetes mellitus without complication ([place])   . GERD (gastroesophageal reflux disease)   . Hyperlipidemia   . Hypertension   . Peripheral vascular disease [facility])     Past Surgical History:  Procedure Laterality Date  . ABDOMINAL AORTOGRAM W/LOWER EXTREMITY N/A 12/27/2016   Procedure: Abdominal Aortogram w/Lower Extremity;  Surgeon: Angelia Mould, MD;  Location: [facility];  Service: Cardiovascular;  Laterality: N/A;  . CARDIAC SURGERY    . CORONARY ANGIOPLASTY WITH STENT PLACEMENT    . MENISCUS REPAIR Right   . NASAL SINUS SURGERY    . TONSILLECTOMY      Social History   Tobacco Use  . Smoking status: Former Smoker    Last attempt to quit: 1994    Years since quitting: 25.7  . Smokeless tobacco: Former Network engineer Use Topics  . Alcohol use: Yes    Alcohol/week: 0.0 standard drinks    Comment: 2-8 times per year  . Drug use: No    Family History  Problem Relation Age of Onset  . Alcohol abuse Brother   . Cancer Brother   . Diabetes Brother   . Hyperlipidemia Brother   . Hypertension Brother   . Diabetes Mother   . Hypertension  Mother   . Hyperlipidemia Mother   . Heart attack Mother   . Hyperlipidemia Father   . Hypertension Father     Allergies  Allergen Reactions  . Dexilant [Dexlansoprazole]     Pressure in stomach and chest    Medication list has been reviewed and updated.  Current Outpatient Medications on File Prior to Visit  Medication Sig Dispense Refill  . amLODipine (NORVASC) 10 MG tablet Take 1  tablet (10 mg total) by mouth daily. 90 tablet 3  . aspirin 81 MG tablet Take 81 mg by mouth daily.    Marland Kitchen atorvastatin (LIPITOR) 80 MG tablet Take 1 tablet (80 mg total) by mouth daily. 90 tablet 3  . Blood Glucose Monitoring Suppl (FREESTYLE LITE) DEVI CHECK BLOOD SUGAR ONCE DAILY  0  . Blood Glucose Monitoring Suppl (FREESTYLE LITE) DEVI CHECK BLOOD SUGAR ONCE DAILY 1 each 0  . busPIRone (BUSPAR) 10 MG tablet Take 1 tablet (10 mg total) by mouth 2 (two) times daily. 180 tablet 3  . Cholecalciferol (VITAMIN D3) 2000 units TABS Take 2,000 Units by mouth daily.    . Coenzyme Q10 300 MG CAPS Take 300 mg by mouth daily.     . Cyanocobalamin (VITAMIN B-12 PO) Take 2,500 mg by mouth daily.    Marland Kitchen ezetimibe (ZETIA) 10 MG tablet Take 1 tablet (10 mg total) by mouth daily. 90 tablet 3  . glipiZIDE (GLUCOTROL) 5 MG tablet TAKE 1 TABLET BY MOUTH EVERY DAY 30 tablet 0  . glucose blood (FREESTYLE LITE) test strip Check blood sugar 1-3x daily 100 each 12  . glucose blood (FREESTYLE LITE) test strip CHECK BLOOD SUGAR ONCE DAILY 100 each 2  . Lancets (FREESTYLE) lancets Check blood sugar once daily 100 each 12  . lisinopril (PRINIVIL,ZESTRIL) 10 MG tablet Take 1 tablet (10 mg total) by mouth daily. 30 tablet 5  . lisinopril-hydrochlorothiazide (PRINZIDE,ZESTORETIC) 20-25 MG tablet Take 1 tablet by mouth daily. 90 tablet 3  . metFORMIN (GLUCOPHAGE) 1000 MG tablet Take 1 tablet (1,000 mg total) by mouth 2 (two) times daily with a meal. 180 tablet 3  . metoprolol succinate (TOPROL-XL) 100 MG 24 hr tablet TAKE 1 TABLET (100 MG TOTAL) BY MOUTH DAILY. TAKE WITH OR IMMEDIATELY FOLLOWING A MEAL. 30 tablet 3  . MULTIPLE VITAMIN PO Take 1 tablet by mouth daily.     . niacin 500 MG tablet Take 500 mg by mouth at bedtime.    . Omega-3 Fatty Acids (EQL OMEGA 3 FISH OIL) 1000 MG CAPS Take 1 capsule by mouth 2 (two) times daily.    Marland Kitchen OVER THE COUNTER MEDICATION Take 1 each by mouth daily. CBD Oil-1 dropperful daily    .  pantoprazole (PROTONIX) 40 MG tablet Take 1 tablet (40 mg total) by mouth daily. 90 tablet 3  . UNABLE TO FIND Take 500 mcg by mouth daily. Med Name: Vitamin K2    . UNABLE TO FIND Med Name: Natural MK-7-100 MCG WITH MK-4-100MG      No current facility-administered medications on file prior to visit.     Review of Systems:  As per HPI- otherwise negative.   Physical Examination: There were no vitals filed for this visit. There were no vitals filed for this visit. There is no height or weight on file to calculate BMI. Ideal Body Weight:    GEN: WDWN, NAD, Non-toxic, A & O x 3 HEENT: Atraumatic, Normocephalic. Neck supple. No masses, No LAD. Ears and Nose: No external deformity. CV: RRR,  No M/G/R. No JVD. No thrill. No extra heart sounds. PULM: CTA B, no wheezes, crackles, rhonchi. No retractions. No resp. distress. No accessory muscle use. ABD: S, NT, ND, +BS. No rebound. No HSM. EXTR: No c/c/e NEURO Normal gait.  PSYCH: Normally interactive. Conversant. Not depressed or anxious appearing.  Calm demeanor.    Assessment and Plan: ***  Signed Lamar Blinks, MD

## 2018-07-10 ENCOUNTER — Telehealth: Payer: Self-pay

## 2018-07-10 ENCOUNTER — Ambulatory Visit: Payer: Medicare HMO | Admitting: Family Medicine

## 2018-07-10 NOTE — Telephone Encounter (Signed)
Received fax from unclear source requesting glipizide 2.5mg  tablets "due to the 5mg  IR tablet not cutting well". Rx appears to call for 5mg , however. Glipizide last refilled 03/03/18 #30, 0RF. LOV 05/22/18. Routed to Dr. Lorelei Pont to review, fax left at [facility] desk.

## 2018-07-11 ENCOUNTER — Other Ambulatory Visit: Payer: Self-pay

## 2018-07-11 MED ORDER — GLIPIZIDE ER 2.5 MG PO TB24: 2.5000 mg | ORAL_TABLET | Freq: Every day | ORAL | 1 refills | Status: DC

## 2018-07-11 NOTE — Telephone Encounter (Signed)
rx sent in 

## 2018-07-19 ENCOUNTER — Other Ambulatory Visit: Payer: Self-pay | Admitting: Family Medicine

## 2018-07-19 DIAGNOSIS — I10 Essential (primary) hypertension: Secondary | ICD-10-CM

## 2018-07-27 ENCOUNTER — Encounter: Payer: Self-pay | Admitting: Family Medicine

## 2018-08-09 DIAGNOSIS — H16223 Keratoconjunctivitis sicca, not specified as Sjogren's, bilateral: Secondary | ICD-10-CM | POA: Diagnosis not present

## 2018-08-09 DIAGNOSIS — H538 Other visual disturbances: Secondary | ICD-10-CM | POA: Diagnosis not present

## 2018-08-09 DIAGNOSIS — E119 Type 2 diabetes mellitus without complications: Secondary | ICD-10-CM | POA: Diagnosis not present

## 2018-09-06 ENCOUNTER — Other Ambulatory Visit: Payer: Self-pay | Admitting: Urology

## 2018-09-06 ENCOUNTER — Telehealth: Payer: Self-pay | Admitting: Cardiology

## 2018-09-06 NOTE — Telephone Encounter (Signed)
New message      [facility] Pre-operative Risk Assessment    Request for surgical clearance:  1. What type of surgery is being performed? Urolift  2. When is this surgery scheduled? 09/18/2018  3. What type of clearance is required (medical clearance vs. Pharmacy clearance to hold med vs. Both)?medical  4. Are there any medications that need to be held prior to surgery and how long?n/a  5. Practice name and name of physician performing surgery? [facility], Dr. Nicolette Bang  6. What is your office phone number [phone] ext [phone]   7.   What is your office fax number [phone]  8.   Anesthesia type (None, local, MAC, general) ? MAC   David Singh 09/06/2018, 1:40 PM  _________________________________________________________________   (provider comments below)

## 2018-09-06 NOTE — Telephone Encounter (Signed)
   Primary Cardiologist: Dr. Kirk Ruths Chart reviewed as part of pre-operative protocol coverage. Patient was contacted 09/06/2018 in reference to pre-operative risk assessment for pending surgery as outlined below.  David Singh was last seen on June 21, 2018 by Dr. Stanford Breed.  Since that day, David Singh has done well without any new symptoms/issues. He notes his surgery date has been moved to January of 2020.   Therefore, based on ACC/AHA guidelines, the patient is currently at acceptable risk for the planned procedure in January without further cardiovascular testing.   I will route this recommendation to the requesting party via Epic fax function and remove from pre-op pool.  Please call with questions.  Truitt Merle, NP 09/06/2018, 2:00 PM

## 2018-09-13 ENCOUNTER — Other Ambulatory Visit: Payer: Self-pay | Admitting: Family Medicine

## 2018-09-13 DIAGNOSIS — K219 Gastro-esophageal reflux disease without esophagitis: Secondary | ICD-10-CM

## 2018-09-20 DIAGNOSIS — R69 Illness, unspecified: Secondary | ICD-10-CM | POA: Diagnosis not present

## 2018-10-12 ENCOUNTER — Other Ambulatory Visit: Payer: Self-pay | Admitting: Family Medicine

## 2018-10-12 DIAGNOSIS — I10 Essential (primary) hypertension: Secondary | ICD-10-CM

## 2018-10-16 ENCOUNTER — Other Ambulatory Visit: Payer: Self-pay | Admitting: Family Medicine

## 2018-10-16 DIAGNOSIS — E785 Hyperlipidemia, unspecified: Secondary | ICD-10-CM

## 2018-10-16 DIAGNOSIS — I10 Essential (primary) hypertension: Secondary | ICD-10-CM

## 2018-10-16 DIAGNOSIS — E781 Pure hyperglyceridemia: Secondary | ICD-10-CM

## 2018-10-26 ENCOUNTER — Encounter (HOSPITAL_BASED_OUTPATIENT_CLINIC_OR_DEPARTMENT_OTHER): Payer: Self-pay | Admitting: *Deleted

## 2018-10-26 ENCOUNTER — Other Ambulatory Visit: Payer: Self-pay

## 2018-10-26 NOTE — Progress Notes (Signed)
Spoke with David Singh after midnight arrive 530 am 11-14-18 [facility] meds to take sip of water: buspirone, atorvastatin, zetia, metoprolol succinate, amlodipine, pantaprazole, no diabetic meds Records on chart: lov dr Stanford Breed 06-21-18  ekg 06-21-18 To see dr Scot Dock for vascular surgical clearance 11-08-18 Wife patsy driver needs I stat 8

## 2018-10-27 ENCOUNTER — Other Ambulatory Visit: Payer: Self-pay

## 2018-10-27 DIAGNOSIS — I739 Peripheral vascular disease, unspecified: Secondary | ICD-10-CM

## 2018-11-04 NOTE — Progress Notes (Addendum)
[place] at [facility] [address], [place], [place] [address] [phone]  Date:  11/09/2018   Name:  David Singh   DOB:  28-Jun-1951   MRN:  [id number]  PCP:  Darreld Mclean, MD    Chief Complaint: Diabetes (4-5 month follow up); Coronary Artery Disease; Hyperlipidemia; and Referral (ortho-right knee pain, surgery 2006-arthritis, pain worsening)   History of Present Illness:  David Singh is a 68 y.o. very pleasant male patient who presents with the following:  Periodic follow-up today for this patient with history of diabetes, CAD status post stent, peripheral arterial disease with persistent symptoms of claudication, hyperlipidemia, hypertension, obesity  I last saw this patient in July, which time he was having a lot of trouble with claudication symptoms.  He was able to walk only about 50 yards before pain would cause him to stop, which is very frustrating He was also working on losing weight at her last visit and was down about 10 pounds  Foot exam is due Eye exam: pt reports this was done in the fall of 2019 Flu shot is up-to-date  Lisinopri/hctz Metformin toprol xl Glipizide Amlodipine lipitor  He saw Dr. Scot Dock yesterday, his vascular surgeon.  He is thinking of seeking a 2nd opinion just to see if any other options might exist for him He states that his T was tested in [place] 6 years ago and it was very low.   He is frustrated by weight gain- he is trying to watch his diet but his exercise capacity is very limited We touched upon weight loss surgery today, this is fine he might think about  He lives in [place] and would be interested in seeing the vascular surgery clinic through [facility] in [facility]  He notes blood sugars ranging from upper 90s to the 120s.  He has not had any lows that he has been aware of He is tolerating metformin and glipizide without any trouble  He continues to take BuSpar for  anxiety, I refilled this for him yesterday.     Wt Readings from Last 3 Encounters:  11/09/18 289 lb (131.1 kg)  11/08/18 285 lb (129.3 kg)  06/21/18 282 lb 1.9 oz (128 kg)    Lab Results  Component Value Date   HGBA1C 7.2 (H) 05/22/2018    Patient Active Problem List   Diagnosis Date Noted  . Chronic gout 03/09/2016  . Hypertriglyceridemia 01/16/2015  . Type 2 diabetes mellitus ([place]) 01/06/2015  . CAD in native artery 01/06/2015  . History of coronary artery stent placement 01/06/2015  . PAD (peripheral artery disease) ([place]) 01/06/2015  . Hyperlipidemia 01/06/2015  . Essential hypertension 01/06/2015    Past Medical History:  Diagnosis Date  . CAD (coronary artery disease)   . Cancer ([place])    basal cell excision upper right ear 7 yrs ago  . Complication of anesthesia    bad pain with anesthesia induction with colonscopy 6 yrs ago  . Diabetes mellitus without complication ([place])    type 2  . GERD (gastroesophageal reflux disease)   . Hyperlipidemia   . Hypertension   . Peripheral vascular disease ([place])    partial clogged artery right leg  . Sleep apnea    yrs ago sleep study no cpap used    Past Surgical History:  Procedure Laterality Date  . ABDOMINAL AORTOGRAM W/LOWER EXTREMITY N/A 12/27/2016   Procedure: Abdominal Aortogram w/Lower Extremity;  Surgeon: Judeth Cornfield  Scot Dock, MD;  Location: [place] [facility];  Service: Cardiovascular;  Laterality: N/A;  . CARDIAC SURGERY    . CORONARY ANGIOPLASTY WITH STENT PLACEMENT  11/2004   in [place]  . MENISCUS REPAIR Right   . NASAL SINUS SURGERY  1976  . TONSILLECTOMY      Social History   Tobacco Use  . Smoking status: Former Smoker    Packs/day: 1.50    Years: 26.00    Pack years: 39.00    Types: Cigarettes    Last attempt to quit: 1994    Years since quitting: 26.0  . Smokeless tobacco: Former Network engineer Use Topics  . Alcohol use: Yes    Alcohol/week: 0.0 standard drinks    Comment: 3 ounces on  monday  . Drug use: No    Family History  Problem Relation Age of Onset  . Alcohol abuse Brother   . Cancer Brother   . Diabetes Brother   . Hyperlipidemia Brother   . Hypertension Brother   . Diabetes Mother   . Hypertension Mother   . Hyperlipidemia Mother   . Heart attack Mother   . Hyperlipidemia Father   . Hypertension Father     Allergies  Allergen Reactions  . Dexilant [Dexlansoprazole]     Pressure in stomach and chest    Medication list has been reviewed and updated.  Current Outpatient Medications on File Prior to Visit  Medication Sig Dispense Refill  . amLODipine (NORVASC) 10 MG tablet TAKE 1 TABLET BY MOUTH EVERY DAY 90 tablet 3  . aspirin 81 MG tablet Take 81 mg by mouth daily.    Marland Kitchen atorvastatin (LIPITOR) 80 MG tablet TAKE 1 TABLET BY MOUTH EVERY DAY 90 tablet 3  . Blood Glucose Monitoring Suppl (FREESTYLE LITE) DEVI CHECK BLOOD SUGAR ONCE DAILY  0  . Blood Glucose Monitoring Suppl (FREESTYLE LITE) DEVI CHECK BLOOD SUGAR ONCE DAILY 1 each 0  . busPIRone (BUSPAR) 10 MG tablet TAKE ONE TABLET BY MOUTH TWICE A DAY 180 tablet 3  . Cholecalciferol (VITAMIN D3) 2000 units TABS Take 2,000 Units by mouth daily.    . Coenzyme Q10 300 MG CAPS Take 300 mg by mouth daily.     . Cyanocobalamin (VITAMIN B-12 PO) Take 2,500 mg by mouth daily.    Marland Kitchen glipiZIDE (GLUCOTROL XL) 2.5 MG 24 hr tablet Take 1 tablet (2.5 mg total) by mouth daily with breakfast. 90 tablet 1  . glucose blood (FREESTYLE LITE) test strip Check blood sugar 1-3x daily 100 each 12  . glucose blood (FREESTYLE LITE) test strip CHECK BLOOD SUGAR ONCE DAILY 100 each 2  . Lancets (FREESTYLE) lancets Check blood sugar once daily 100 each 12  . lisinopril (PRINIVIL,ZESTRIL) 10 MG tablet TAKE 1 TABLET BY MOUTH EVERY DAY 90 tablet 1  . lisinopril-hydrochlorothiazide (PRINZIDE,ZESTORETIC) 20-25 MG tablet TAKE 1 TABLET BY MOUTH EVERY DAY 90 tablet 1  . metFORMIN (GLUCOPHAGE) 1000 MG tablet Take 1 tablet (1,000 mg  total) by mouth 2 (two) times daily with a meal. 180 tablet 3  . metoprolol succinate (TOPROL-XL) 100 MG 24 hr tablet TAKE 1 TABLET (100 MG TOTAL) BY MOUTH DAILY. TAKE WITH OR IMMEDIATELY FOLLOWING A MEAL. 90 tablet 1  . MULTIPLE VITAMIN PO Take 1 tablet by mouth daily.     . niacin 500 MG tablet Take 500 mg by mouth every morning.     . Omega-3 Fatty Acids (EQL OMEGA 3 FISH OIL) 1000 MG CAPS Take 1 capsule by  mouth 2 (two) times daily.    . pantoprazole (PROTONIX) 40 MG tablet TAKE 1 TABLET BY MOUTH EVERY DAY 90 tablet 1  . prednisoLONE acetate (PRED FORTE) 1 % ophthalmic suspension INSTILL 1 DROP INTO LEFT EYE TWICE A DAY    . Probiotic Product (PROBIOTIC ADVANCED PO) Take by mouth every evening. [employer]co brand takes 2 tabs    . UNABLE TO FIND Take 500 mcg by mouth daily. Med Name: Vitamin K2    . UNABLE TO FIND Med Name: Natural MK-7-100 MCG WITH MK-4-100MG      No current facility-administered medications on file prior to visit.     Review of Systems:  As per HPI- otherwise negative. No fever or chills, no chest pain or shortness of breath  Physical Examination: Vitals:   11/09/18 0817  BP: 126/82  Pulse: 71  Resp: 16  Temp: 98 F (36.7 C)  SpO2: 97%   Vitals:   11/09/18 0817  Weight: 289 lb (131.1 kg)  Height: 5\' 9"  (1.753 m)   Body mass index is 42.68 kg/m. Ideal Body Weight: Weight in (lb) to have BMI = 25: 168.9  GEN: WDWN, NAD, Non-toxic, A & O x 3, obese, looks well HEENT: Atraumatic, Normocephalic. Neck supple. No masses, No LAD. Ears and Nose: No external deformity. CV: RRR, No M/G/R. No JVD. No thrill. No extra heart sounds. PULM: CTA B, no wheezes, crackles, rhonchi. No retractions. No resp. distress. No accessory muscle use. EXTR: No c/c/e NEURO Normal gait.  PSYCH: Normally interactive. Conversant. Not depressed or anxious appearing.  Calm demeanor.  Foot exam done today.  I cannot palpate pulses, but toes are warm and well-perfused  Assessment and  Plan: Essential hypertension - Plan: CBC, Basic metabolic panel  Type 2 diabetes mellitus without complication, without long-term current use of insulin (HCC) - Plan: Hemoglobin E9F, Basic metabolic panel  Dyslipidemia  PAD (peripheral artery disease) ([place]) - Plan: Hemoglobin A1c  Class 3 severe obesity due to excess calories with serious comorbidity and body mass index (BMI) of 40.0 to 44.9 in adult [facility])  Extremity atherosclerosis with intermittent claudication ([place]) - Plan: Ambulatory referral to Vascular Surgery  Periodic recheck today. Blood pressures under okay control. Cholesterol check is up-to-date. We will check his A1c today, to follow-up on diabetes. Discussed the possibility of weight loss surgery, he will think about this.  Advised him that the first step would be to set up an informational seminar Patient like to seek a second opinion with vascular surgery, this is certainly fine.  I put in referral for him today  Will plan further follow- up pending labs.   Signed Lamar Blinks, MD  Received his labs, message to patient and also called and discussed his sodium.  He is currently on lisinopril 30 mg and HCTZ 25 mg.  Would like to try increasing lisinopril to 40, and decreasing HCTZ to 12.5.  He is fine with this plan.  We will make this change, and that he will come in for a BMP only in about 1 month. A1c is stable Results for orders placed or performed in visit on 11/09/18  CBC  Result Value Ref Range   WBC 6.6 4.0 - 10.5 K/uL   RBC 4.72 4.22 - 5.81 Mil/uL   Platelets 190.0 150.0 - 400.0 K/uL   Hemoglobin 13.4 13.0 - 17.0 g/dL   HCT 40.2 39.0 - 52.0 %   MCV 85.0 78.0 - 100.0 fl   MCHC 33.4 30.0 - 36.0 g/dL  RDW 14.1 11.5 - 15.5 %  Hemoglobin A1c  Result Value Ref Range   Hgb A1c MFr Bld 7.1 (H) 4.6 - 6.5 %  Basic metabolic panel  Result Value Ref Range   Sodium 130 (L) 135 - 145 mEq/L   Potassium 4.2 3.5 - 5.1 mEq/L   Chloride 92 (L) 96 - 112 mEq/L    CO2 29 19 - 32 mEq/L   Glucose, Bld 129 (H) 70 - 99 mg/dL   BUN 10 6 - 23 mg/dL   Creatinine, Ser 0.86 0.40 - 1.50 mg/dL   Calcium 9.5 8.4 - 10.5 mg/dL   GFR 94.31 >60.00 mL/min

## 2018-11-08 ENCOUNTER — Other Ambulatory Visit: Payer: Self-pay

## 2018-11-08 ENCOUNTER — Ambulatory Visit (HOSPITAL_COMMUNITY)
Admission: RE | Admit: 2018-11-08 | Discharge: 2018-11-08 | Disposition: A | Discharge status: Home / Self Care | Payer: Medicare HMO | Source: Ambulatory Visit | Attending: Family | Admitting: Family

## 2018-11-08 ENCOUNTER — Encounter: Payer: Self-pay | Admitting: Vascular Surgery

## 2018-11-08 ENCOUNTER — Ambulatory Visit: Payer: Medicare HMO | Admitting: Vascular Surgery

## 2018-11-08 ENCOUNTER — Other Ambulatory Visit: Payer: Self-pay | Admitting: Family Medicine

## 2018-11-08 VITALS — BP 137/80 | HR 71 | Temp 98.0°F | Resp 18 | Ht 69.0 in | Wt 285.0 lb

## 2018-11-08 DIAGNOSIS — F411 Generalized anxiety disorder: Secondary | ICD-10-CM

## 2018-11-08 DIAGNOSIS — I7409 Other arterial embolism and thrombosis of abdominal aorta: Secondary | ICD-10-CM

## 2018-11-08 DIAGNOSIS — I739 Peripheral vascular disease, unspecified: Secondary | ICD-10-CM | POA: Insufficient documentation

## 2018-11-08 NOTE — Progress Notes (Signed)
Pt has vascular clearance from dr Scot Dock with office note dated 11-08-2018 in epic.  Printed and placed with chart.

## 2018-11-08 NOTE — Progress Notes (Signed)
Patient name: David Singh MRN: [id number] DOB: 09-05-1951 Sex: male  REASON FOR VISIT:   Follow-up of aortoiliac occlusive disease.  HPI:   David Singh is a pleasant 68 y.o. male who I have been following with peripheral vascular disease.  I last saw him on 12/28/2017.  He is undergone a previous arteriogram in March 2018 which showed a bulky calcific plaque in the distal aorta and bilateral common iliac arteries.  Given his obesity I did not think he was a good candidate for aortofemoral bypass grafting.  When I saw him last he was continuing to have bilateral lower extremity claudication which was more significant on the right side.  At the time of his last visit his ABI on the right was 71% and on the left 100%.  His symptoms are gradually improving and he seems very motivated.  He quit smoking in 1994 and was exercising and working on nutrition.  He comes in for a one-year follow-up visit.  The patient continues to have bilateral lower extremity claudication which involves his thighs and hips.  His symptoms are more significant on the right side.  They occur at a variable distance.  He especially has a hard time walking uphill.  He had been exercising but has stopped exercising for the most part.  He would like to get back into it this year.  He denies any history of rest pain or history of nonhealing ulcers.  He does occasionally get some paresthesias on the bottoms of both feet.  He is scheduled to undergo a urologic procedure for benign prostatic hypertrophy called the UroLift procedure.  He is on aspirin and is on a statin.  Past Medical History:  Diagnosis Date  . CAD (coronary artery disease)   . Cancer ([place])    basal cell excision upper right ear 7 yrs ago  . Complication of anesthesia    bad pain with anesthesia induction with colonscopy 6 yrs ago  . Diabetes mellitus without complication ([place])    type 2  . GERD (gastroesophageal reflux disease)   . Hyperlipidemia   .  Hypertension   . Peripheral vascular disease ([place])    partial clogged artery right leg  . Sleep apnea    yrs ago sleep study no cpap used    Family History  Problem Relation Age of Onset  . Alcohol abuse Brother   . Cancer Brother   . Diabetes Brother   . Hyperlipidemia Brother   . Hypertension Brother   . Diabetes Mother   . Hypertension Mother   . Hyperlipidemia Mother   . Heart attack Mother   . Hyperlipidemia Father   . Hypertension Father     SOCIAL HISTORY: Social History   Tobacco Use  . Smoking status: Former Smoker    Packs/day: 1.50    Years: 26.00    Pack years: 39.00    Types: Cigarettes    Last attempt to quit: 1994    Years since quitting: 26.0  . Smokeless tobacco: Former Network engineer Use Topics  . Alcohol use: Yes    Alcohol/week: 0.0 standard drinks    Comment: 3 ounces on monday    Allergies  Allergen Reactions  . Dexilant [Dexlansoprazole]     Pressure in stomach and chest    Current Outpatient Medications  Medication Sig Dispense Refill  . amLODipine (NORVASC) 10 MG tablet TAKE 1 TABLET BY MOUTH EVERY DAY 90 tablet 3  . aspirin 81 MG tablet Take 81 mg by  mouth daily.    Marland Kitchen atorvastatin (LIPITOR) 80 MG tablet TAKE 1 TABLET BY MOUTH EVERY DAY 90 tablet 3  . Blood Glucose Monitoring Suppl (FREESTYLE LITE) DEVI CHECK BLOOD SUGAR ONCE DAILY  0  . Blood Glucose Monitoring Suppl (FREESTYLE LITE) DEVI CHECK BLOOD SUGAR ONCE DAILY 1 each 0  . busPIRone (BUSPAR) 10 MG tablet TAKE ONE TABLET BY MOUTH TWICE A DAY 180 tablet 3  . Cholecalciferol (VITAMIN D3) 2000 units TABS Take 2,000 Units by mouth daily.    . Coenzyme Q10 300 MG CAPS Take 300 mg by mouth daily.     . Cyanocobalamin (VITAMIN B-12 PO) Take 2,500 mg by mouth daily.    Marland Kitchen glipiZIDE (GLUCOTROL XL) 2.5 MG 24 hr tablet Take 1 tablet (2.5 mg total) by mouth daily with breakfast. 90 tablet 1  . glucose blood (FREESTYLE LITE) test strip Check blood sugar 1-3x daily 100 each 12  . glucose  blood (FREESTYLE LITE) test strip CHECK BLOOD SUGAR ONCE DAILY 100 each 2  . Lancets (FREESTYLE) lancets Check blood sugar once daily 100 each 12  . lisinopril (PRINIVIL,ZESTRIL) 10 MG tablet TAKE 1 TABLET BY MOUTH EVERY DAY 90 tablet 1  . lisinopril-hydrochlorothiazide (PRINZIDE,ZESTORETIC) 20-25 MG tablet TAKE 1 TABLET BY MOUTH EVERY DAY 90 tablet 1  . metFORMIN (GLUCOPHAGE) 1000 MG tablet Take 1 tablet (1,000 mg total) by mouth 2 (two) times daily with a meal. 180 tablet 3  . metoprolol succinate (TOPROL-XL) 100 MG 24 hr tablet TAKE 1 TABLET (100 MG TOTAL) BY MOUTH DAILY. TAKE WITH OR IMMEDIATELY FOLLOWING A MEAL. 90 tablet 1  . MULTIPLE VITAMIN PO Take 1 tablet by mouth daily.     . niacin 500 MG tablet Take 500 mg by mouth every morning.     . Omega-3 Fatty Acids (EQL OMEGA 3 FISH OIL) 1000 MG CAPS Take 1 capsule by mouth 2 (two) times daily.    . pantoprazole (PROTONIX) 40 MG tablet TAKE 1 TABLET BY MOUTH EVERY DAY 90 tablet 1  . prednisoLONE acetate (PRED FORTE) 1 % ophthalmic suspension INSTILL 1 DROP INTO LEFT EYE TWICE A DAY    . Probiotic Product (PROBIOTIC ADVANCED PO) Take by mouth every evening. [employer]co brand takes 2 tabs    . UNABLE TO FIND Take 500 mcg by mouth daily. Med Name: Vitamin K2    . UNABLE TO FIND Med Name: Natural MK-7-100 MCG WITH MK-4-100MG     . ezetimibe (ZETIA) 10 MG tablet Take 1 tablet (10 mg total) by mouth daily. 90 tablet 3   No current facility-administered medications for this visit.     REVIEW OF SYSTEMS:  [X]  denotes positive finding, [ ]  denotes negative finding Cardiac  Comments:  Chest pain or chest pressure:    Shortness of breath upon exertion:    Short of breath when lying flat:    Irregular heart rhythm:        Vascular    Pain in calf, thigh, or hip brought on by ambulation: x   Pain in feet at night that wakes you up from your sleep:     Blood clot in your veins:    Leg swelling:         Pulmonary    Oxygen at home:    Productive  cough:     Wheezing:         Neurologic    Sudden weakness in arms or legs:     Sudden numbness in arms or legs:  Sudden onset of difficulty speaking or slurred speech:    Temporary loss of vision in one eye:     Problems with dizziness:         Gastrointestinal    Blood in stool:     Vomited blood:         Genitourinary    Burning when urinating:     Blood in urine:        Psychiatric    Major depression:         Hematologic    Bleeding problems:    Problems with blood clotting too easily:        Skin    Rashes or ulcers:        Constitutional    Fever or chills:     PHYSICAL EXAM:   Vitals:   11/08/18 1550  BP: 137/80  Pulse: 71  Resp: 18  Temp: 98 F (36.7 C)  TempSrc: Oral  SpO2: 96%  Weight: 285 lb (129.3 kg)  Height: 5\' 9"  (1.753 m)   Body mass index is 42.09 kg/m.   GENERAL: The patient is a well-nourished male, in no acute distress. The vital signs are documented above. CARDIAC: There is a regular rate and rhythm.  VASCULAR: I do not detect carotid bruits. I cannot palpate popliteal or pedal pulses. PULMONARY: There is good air exchange bilaterally without wheezing or rales. ABDOMEN: Soft and non-tender with normal pitched bowel sounds.  MUSCULOSKELETAL: There are no major deformities or cyanosis. NEUROLOGIC: No focal weakness or paresthesias are detected. SKIN: There are no ulcers or rashes noted. PSYCHIATRIC: The patient has a normal affect.  DATA:    ARTERIAL DOPPLER STUDY: I have independently interpreted his arterial Doppler study today.  On the right side there is a monophasic dorsalis pedis and posterior tibial signal.  ABI is 72%.  Toe pressure on the right is 56 mmHg.  On the left side there is a triphasic dorsalis pedis and posterior tibial signal.  ABI is 99%.  Toe pressure on the left is 94 mmHg.  These ABIs have not changed significantly compared to the study done on 12/28/2017.  ARTERIOGRAM: I did review his arteriogram from  March 2018.  He has a bulky calcific plaque at the aortic bifurcation which essentially occludes the proximal right common iliac artery and there is diffuse disease in the proximal left common iliac artery.  This plaque extends into the aorta and there really not any good options even with a covered stent.  At that time he did not have significant infrainguinal arterial occlusive disease.  MEDICAL ISSUES:   AORTOILIAC OCCLUSIVE DISEASE: This patient has stable bilateral lower extremity claudication.  Based on his previous arteriogram he does not have a good option for endovascular approach to his aortoiliac occlusive disease.  He has a bulky calcific plaque that involves the proximal left common iliac artery with occlusion of the proximal right common iliac artery.  He would be at increased risk for aortofemoral bypass grafting or even axillofemoral bypass grafting given his obesity.  His BMI is 42.  We have again discussed the importance of trying to stay as active as possible.  He is considering water aerobics.  We also discussed the importance of nutrition.  I would only consider surgery if he develops critical limb ischemia or disabling symptoms.  With respect to his upcoming urologic procedure I do not see any contraindication from a vascular standpoint.  I ordered follow-up ABIs in 1 year and I will  see him back at that time.  He knows to call sooner if he has problems.  Deitra Mayo [facility] [phone]

## 2018-11-09 ENCOUNTER — Ambulatory Visit (INDEPENDENT_AMBULATORY_CARE_PROVIDER_SITE_OTHER): Payer: Medicare HMO | Admitting: Family Medicine

## 2018-11-09 ENCOUNTER — Encounter: Payer: Self-pay | Admitting: Family Medicine

## 2018-11-09 VITALS — BP 126/82 | HR 71 | Temp 98.0°F | Resp 16 | Ht 69.0 in | Wt 289.0 lb

## 2018-11-09 DIAGNOSIS — E871 Hypo-osmolality and hyponatremia: Secondary | ICD-10-CM

## 2018-11-09 DIAGNOSIS — I70219 Atherosclerosis of native arteries of extremities with intermittent claudication, unspecified extremity: Secondary | ICD-10-CM

## 2018-11-09 DIAGNOSIS — I739 Peripheral vascular disease, unspecified: Secondary | ICD-10-CM

## 2018-11-09 DIAGNOSIS — E785 Hyperlipidemia, unspecified: Secondary | ICD-10-CM | POA: Diagnosis not present

## 2018-11-09 DIAGNOSIS — Z6841 Body Mass Index (BMI) 40.0 and over, adult: Secondary | ICD-10-CM

## 2018-11-09 DIAGNOSIS — I10 Essential (primary) hypertension: Secondary | ICD-10-CM

## 2018-11-09 DIAGNOSIS — E119 Type 2 diabetes mellitus without complications: Secondary | ICD-10-CM

## 2018-11-09 LAB — CBC
HCT: 40.2 % (ref 39.0–52.0)
Hemoglobin: 13.4 g/dL (ref 13.0–17.0)
MCHC: 33.4 g/dL (ref 30.0–36.0)
MCV: 85 fl (ref 78.0–100.0)
Platelets: 190 10*3/uL (ref 150.0–400.0)
RBC: 4.72 Mil/uL (ref 4.22–5.81)
RDW: 14.1 % (ref 11.5–15.5)
WBC: 6.6 10*3/uL (ref 4.0–10.5)

## 2018-11-09 LAB — BASIC METABOLIC PANEL WITH GFR
BUN: 10 mg/dL (ref 6–23)
CO2: 29 meq/L (ref 19–32)
Calcium: 9.5 mg/dL (ref 8.4–10.5)
Chloride: 92 meq/L — ABNORMAL LOW (ref 96–112)
Creatinine, Ser: 0.86 mg/dL (ref 0.40–1.50)
GFR: 94.31 mL/min (ref >60.00)
Glucose, Bld: 129 mg/dL — ABNORMAL HIGH (ref 70–99)
Potassium: 4.2 meq/L (ref 3.5–5.1)
Sodium: 130 meq/L — ABNORMAL LOW (ref 135–145)

## 2018-11-09 LAB — HEMOGLOBIN A1C: Hgb A1c MFr Bld: 7.1 % — ABNORMAL HIGH (ref 4.6–6.5)

## 2018-11-09 MED ORDER — HYDROCHLOROTHIAZIDE 12.5 MG PO CAPS: 12.5000 mg | ORAL_CAPSULE | Freq: Every day | ORAL | 3 refills | Status: DC

## 2018-11-09 MED ORDER — LISINOPRIL 40 MG PO TABS: 40.0000 mg | ORAL_TABLET | Freq: Every day | ORAL | 3 refills | Status: DC

## 2018-11-09 NOTE — Addendum Note (Signed)
Addended by: Lamar Blinks C on: 11/09/2018 06:54 PM   Modules accepted: Orders

## 2018-11-09 NOTE — Patient Instructions (Addendum)
It was good to see you today- I'll be in touch with your labs asap  It sounds as though your blood sugars been under good control, I will be in touch with your A1c.  We will refer you to get a second opinion from the [facility] vascular surgery clinic in [facility].  I placed a referral, and they should give you a call. I would encourage you to keep working on exercise, this may help keep your legs from getting worse.   As we discussed, weight loss surgery would be a reasonable thing for you to look into.  I do think it would help as far as her blood pressure and blood sugar control.  Typically, the first step is to do a seminar either online or in person.  [facility] and also [facility] have weight loss surgery programs.  If you are interested, looking up online and register for the informational seminar

## 2018-11-14 ENCOUNTER — Ambulatory Visit (HOSPITAL_BASED_OUTPATIENT_CLINIC_OR_DEPARTMENT_OTHER): Admission: RE | Admit: 2018-11-14 | Payer: Medicare HMO | Source: Home / Self Care | Admitting: Urology

## 2018-11-14 HISTORY — DX: Other complications of anesthesia, initial encounter: T88.59XA

## 2018-11-14 HISTORY — DX: Malignant (primary) neoplasm, unspecified: C80.1

## 2018-11-14 HISTORY — DX: Adverse effect of unspecified anesthetic, initial encounter: T41.45XA

## 2018-11-14 HISTORY — DX: Sleep apnea, unspecified: G47.30

## 2018-11-14 SURGERY — CYSTOSCOPY WITH INSERTION OF UROLIFT
Anesthesia: Monitor Anesthesia Care

## 2018-12-01 ENCOUNTER — Other Ambulatory Visit: Payer: Self-pay | Admitting: Family Medicine

## 2018-12-01 DIAGNOSIS — E119 Type 2 diabetes mellitus without complications: Secondary | ICD-10-CM

## 2018-12-08 ENCOUNTER — Encounter: Payer: Self-pay | Admitting: Family Medicine

## 2018-12-08 ENCOUNTER — Encounter: Payer: Self-pay | Admitting: Family

## 2018-12-08 ENCOUNTER — Ambulatory Visit (INDEPENDENT_AMBULATORY_CARE_PROVIDER_SITE_OTHER): Payer: Medicare HMO | Admitting: Family

## 2018-12-08 VITALS — BP 129/69 | HR 71 | Temp 97.9°F | Resp 16 | Ht 69.0 in | Wt 293.8 lb

## 2018-12-08 DIAGNOSIS — M109 Gout, unspecified: Secondary | ICD-10-CM | POA: Diagnosis not present

## 2018-12-08 LAB — URIC ACID: Uric Acid, Serum: 7.9 mg/dL — ABNORMAL HIGH (ref 4.0–7.8)

## 2018-12-08 MED ORDER — COLCHICINE 0.6 MG PO TABS: ORAL_TABLET | ORAL | 0 refills | Status: DC

## 2018-12-08 NOTE — Progress Notes (Signed)
Subjective:    Patient ID: David Singh, male    DOB: [birth year], 68 y.o.   MRN: [id number]  HPI   Patient is a 68 yr old male who presents today with chief complaint of right sided toe pain. Reports that pain started on 12/06/18.  Last night he couldn't even sleep.  Hurts to walk. Thinks that he might have had gout once before. Unsure of family hx of gout.  Notes associated swelling and stiffness of toe. No alleviating factors.     Review of Systems   See HPI  Past Medical History:  Diagnosis Date  . CAD (coronary artery disease)   . Cancer ([place])    basal cell excision upper right ear 7 yrs ago  . Complication of anesthesia    bad pain with anesthesia induction with colonscopy 6 yrs ago  . Diabetes mellitus without complication ([place])    type 2  . GERD (gastroesophageal reflux disease)   . Hyperlipidemia   . Hypertension   . Peripheral vascular disease ([place])    partial clogged artery right leg  . Sleep apnea    yrs ago sleep study no cpap used     Social History   Socioeconomic History  . Marital status: Married    Spouse name: Not on file  . Number of children: 2  . Years of education: Not on file  . Highest education level: Not on file  Occupational History  . Not on file  Social Needs  . Financial resource strain: Not on file  . Food insecurity:    Worry: Not on file    Inability: Not on file  . Transportation needs:    Medical: Not on file    Non-medical: Not on file  Tobacco Use  . Smoking status: Former Smoker    Packs/day: 1.50    Years: 26.00    Pack years: 39.00    Types: Cigarettes    Last attempt to quit: 1994    Years since quitting: 26.1  . Smokeless tobacco: Former Network engineer and Sexual Activity  . Alcohol use: Yes    Alcohol/week: 0.0 standard drinks    Comment: 3 ounces on monday  . Drug use: No  . Sexual activity: Yes  Lifestyle  . Physical activity:    Days per week: Not on file    Minutes per session: Not on file    . Stress: Not on file  Relationships  . Social connections:    Talks on phone: Not on file    Gets together: Not on file    Attends religious service: Not on file    Active member of club or organization: Not on file    Attends meetings of clubs or organizations: Not on file    Relationship status: Not on file  . Intimate partner violence:    Fear of current or ex partner: Not on file    Emotionally abused: Not on file    Physically abused: Not on file    Forced sexual activity: Not on file  Other Topics Concern  . Not on file  Social History Narrative  . Not on file    Past Surgical History:  Procedure Laterality Date  . ABDOMINAL AORTOGRAM W/LOWER EXTREMITY N/A 12/27/2016   Procedure: Abdominal Aortogram w/Lower Extremity;  Surgeon: Angelia Mould, MD;  Location: [facility];  Service: Cardiovascular;  Laterality: N/A;  . CARDIAC SURGERY    . CORONARY ANGIOPLASTY WITH STENT PLACEMENT  11/2004   in [place]  . MENISCUS REPAIR Right   . NASAL SINUS SURGERY  1976  . TONSILLECTOMY      Family History  Problem Relation Age of Onset  . Alcohol abuse Brother   . Cancer Brother   . Diabetes Brother   . Hyperlipidemia Brother   . Hypertension Brother   . Diabetes Mother   . Hypertension Mother   . Hyperlipidemia Mother   . Heart attack Mother   . Hyperlipidemia Father   . Hypertension Father     Allergies  Allergen Reactions  . Dexilant [Dexlansoprazole]     Pressure in stomach and chest    Current Outpatient Medications on File Prior to Visit  Medication Sig Dispense Refill  . amLODipine (NORVASC) 10 MG tablet TAKE 1 TABLET BY MOUTH EVERY DAY 90 tablet 3  . aspirin 81 MG tablet Take 81 mg by mouth daily.    Marland Kitchen atorvastatin (LIPITOR) 80 MG tablet TAKE 1 TABLET BY MOUTH EVERY DAY 90 tablet 3  . Blood Glucose Monitoring Suppl (FREESTYLE LITE) DEVI CHECK BLOOD SUGAR ONCE DAILY  0  . Blood Glucose Monitoring Suppl (FREESTYLE LITE) DEVI CHECK BLOOD SUGAR  ONCE DAILY 1 each 0  . busPIRone (BUSPAR) 10 MG tablet TAKE ONE TABLET BY MOUTH TWICE A DAY 180 tablet 3  . Cholecalciferol (VITAMIN D3) 2000 units TABS Take 2,000 Units by mouth daily.    . Coenzyme Q10 300 MG CAPS Take 300 mg by mouth daily.     . Cyanocobalamin (VITAMIN B-12 PO) Take 2,500 mg by mouth daily.    Marland Kitchen glipiZIDE (GLUCOTROL XL) 2.5 MG 24 hr tablet Take 1 tablet (2.5 mg total) by mouth daily with breakfast. 90 tablet 1  . glucose blood (FREESTYLE LITE) test strip Check blood sugar 1-3x daily 100 each 12  . glucose blood (FREESTYLE LITE) test strip CHECK BLOOD SUGAR ONCE DAILY 100 each 2  . hydrochlorothiazide (MICROZIDE) 12.5 MG capsule Take 1 capsule (12.5 mg total) by mouth daily. 90 capsule 3  . Lancets (FREESTYLE) lancets Check blood sugar once daily 100 each 12  . lisinopril (PRINIVIL,ZESTRIL) 40 MG tablet Take 1 tablet (40 mg total) by mouth daily. 90 tablet 3  . metFORMIN (GLUCOPHAGE) 1000 MG tablet TAKE 1 TABLET (1,000 MG TOTAL) BY MOUTH 2 (TWO) TIMES DAILY WITH A MEAL. 180 tablet 3  . metoprolol succinate (TOPROL-XL) 100 MG 24 hr tablet TAKE 1 TABLET (100 MG TOTAL) BY MOUTH DAILY. TAKE WITH OR IMMEDIATELY FOLLOWING A MEAL. 90 tablet 1  . MULTIPLE VITAMIN PO Take 1 tablet by mouth daily.     . niacin 500 MG tablet Take 500 mg by mouth every morning.     . Omega-3 Fatty Acids (EQL OMEGA 3 FISH OIL) 1000 MG CAPS Take 1 capsule by mouth 2 (two) times daily.    . pantoprazole (PROTONIX) 40 MG tablet TAKE 1 TABLET BY MOUTH EVERY DAY 90 tablet 1  . prednisoLONE acetate (PRED FORTE) 1 % ophthalmic suspension INSTILL 1 DROP INTO LEFT EYE TWICE A DAY    . Probiotic Product (PROBIOTIC ADVANCED PO) Take by mouth every evening. [employer]co brand takes 2 tabs    . UNABLE TO FIND Take 500 mcg by mouth daily. Med Name: Vitamin K2    . UNABLE TO FIND Med Name: Natural MK-7-100 MCG WITH MK-4-100MG      No current facility-administered medications on file prior to visit.     BP 129/69 (BP  Location: Right Arm, Patient Position:  Sitting, Cuff Size: Large)   Pulse 71   Temp 97.9 F (36.6 C) (Oral)   Resp 16   Ht 5\' 9"  (1.753 m)   Wt 293 lb 12.8 oz (133.3 kg)   SpO2 99%   BMI 43.39 kg/m        Objective:   Physical Exam Constitutional:      General: He is not in acute distress.    Appearance: He is well-developed.  HENT:     Head: Normocephalic and atraumatic.  Cardiovascular:     Rate and Rhythm: Normal rate and regular rhythm.     Heart sounds: No murmur.  Pulmonary:     Effort: Pulmonary effort is normal. No respiratory distress.     Breath sounds: Normal breath sounds. No wheezing or rales.  Skin:    General: Skin is warm and dry.  Neurological:     Mental Status: He is alert and oriented to person, place, and time.  Psychiatric:        Behavior: Behavior normal.        Thought Content: Thought content normal.   Musculoskeletal: erythema and swelling that base of the right great toe       Assessment & Plan:  Acute gout flare- will rx with colchicine, check uric acid level. Advised pt on low purine diet.  Advised pt to hold lipitor tonight and tomorrow due to potential interaction with colchicine.  He is advised to call if symptoms worsen or if they fail to improve.

## 2018-12-08 NOTE — Patient Instructions (Signed)
Please begin colchicine for likely gout flare.  Work on gout diet to prevent further flares. Complete lab work prior to leaving.   Low-Purine Eating Plan A low-purine eating plan involves making food choices to limit your intake of purine. Purine is a kind of uric acid. Too much uric acid in your blood can cause certain conditions, such as gout and kidney stones. Eating a low-purine diet can help control these conditions. What are tips for following this plan? Reading food labels   Avoid foods with saturated or Trans fat.  Check the ingredient list of grains-based foods, such as bread and cereal, to make sure that they contain whole grains.  Check the ingredient list of sauces or soups to make sure they do not contain meat or fish.  When choosing soft drinks, check the ingredient list to make sure they do not contain high-fructose corn syrup. Shopping  Buy plenty of fresh fruits and vegetables.  Avoid buying canned or fresh fish.  Buy dairy products labeled as low-fat or nonfat.  Avoid buying premade or processed foods. These foods are often high in fat, salt (sodium), and added sugar. Cooking  Use olive oil instead of butter when cooking. Oils like olive oil, canola oil, and sunflower oil contain healthy fats. Meal planning  Learn which foods do or do not affect you. If you find out that a food tends to cause your gout symptoms to flare up, avoid eating that food. You can enjoy foods that do not cause problems. If you have any questions about a food item, talk with your dietitian or health care provider.  Limit foods high in fat, especially saturated fat. Fat makes it harder for your body to get rid of uric acid.  Choose foods that are lower in fat and are lean sources of protein. General guidelines  Limit alcohol intake to no more than 1 drink a day for nonpregnant women and 2 drinks a day for men. One drink equals 12 oz of beer, 5 oz of wine, or 1 oz of hard liquor. Alcohol  can affect the way your body gets rid of uric acid.  Drink plenty of water to keep your urine clear or pale yellow. Fluids can help remove uric acid from your body.  If directed by your health care provider, take a vitamin C supplement.  Work with your health care provider and dietitian to develop a plan to achieve or maintain a healthy weight. Losing weight can help reduce uric acid in your blood. What foods are recommended? The items listed may not be a complete list. Talk with your dietitian about what dietary choices are best for you. Foods low in purines Foods low in purines do not need to be limited. These include:  All fruits.  All low-purine vegetables, pickles, and olives.  Breads, pasta, rice, cornbread, and popcorn. Cake and other baked goods.  All dairy foods.  Eggs, nuts, and nut butters.  Spices and condiments, such as salt, herbs, and vinegar.  Plant oils, butter, and margarine.  Water, sugar-free soft drinks, tea, coffee, and cocoa.  Vegetable-based soups, broths, sauces, and gravies. Foods moderate in purines Foods moderate in purines should be limited to the amounts listed.   cup of asparagus, cauliflower, spinach, mushrooms, or green peas, each day.  2/3 cup uncooked oatmeal, each day.   cup dry wheat bran or wheat germ, each day.  2-3 ounces of meat or poultry, each day.  4-6 ounces of shellfish, such as crab, lobster,  oysters, or shrimp, each day.  1 cup cooked beans, peas, or lentils, each day.  Soup, broths, or bouillon made from meat or fish. Limit these foods as much as possible. What foods are not recommended? The items listed may not be a complete list. Talk with your dietitian about what dietary choices are best for you. Limit your intake of foods high in purines, including:  Beer and other alcohol.  Meat-based gravy or sauce.  Canned or fresh fish, such as: ? Anchovies, sardines, herring, and tuna. ? Mussels and  scallops. ? Codfish, trout, and haddock.  Berniece Salines.  Organ meats, such as: ? Liver or kidney. ? Tripe. ? Sweetbreads (thymus gland or pancreas).  Wild Clinical biochemist.  Yeast or yeast extract supplements.  Drinks sweetened with high-fructose corn syrup. Summary  Eating a low-purine diet can help control conditions caused by too much uric acid in the body, such as gout or kidney stones.  Choose low-purine foods, limit alcohol, and limit foods high in fat.  You will learn over time which foods do or do not affect you. If you find out that a food tends to cause your gout symptoms to flare up, avoid eating that food. This information is not intended to replace advice given to you by your health care provider. Make sure you discuss any questions you have with your health care provider. Document Released: 02/05/2011 Document Revised: 11/24/2016 Document Reviewed: 11/24/2016 Elsevier Interactive Patient Education  2019 [employer].

## 2018-12-15 DIAGNOSIS — I70219 Atherosclerosis of native arteries of extremities with intermittent claudication, unspecified extremity: Secondary | ICD-10-CM | POA: Diagnosis not present

## 2018-12-15 DIAGNOSIS — I739 Peripheral vascular disease, unspecified: Secondary | ICD-10-CM | POA: Diagnosis not present

## 2018-12-20 ENCOUNTER — Other Ambulatory Visit (INDEPENDENT_AMBULATORY_CARE_PROVIDER_SITE_OTHER): Payer: Medicare HMO

## 2018-12-20 ENCOUNTER — Encounter: Payer: Self-pay | Admitting: Family Medicine

## 2018-12-20 DIAGNOSIS — E871 Hypo-osmolality and hyponatremia: Secondary | ICD-10-CM | POA: Diagnosis not present

## 2018-12-20 LAB — BASIC METABOLIC PANEL
BUN: 11 mg/dL (ref 6–23)
CO2: 25 mEq/L (ref 19–32)
Calcium: 9.6 mg/dL (ref 8.4–10.5)
Chloride: 95 mEq/L — ABNORMAL LOW (ref 96–112)
Creatinine, Ser: 0.82 mg/dL (ref 0.40–1.50)
GFR: 93.71 mL/min (ref >60.00)
Glucose, Bld: 144 mg/dL — ABNORMAL HIGH (ref 70–99)
POTASSIUM: 4 meq/L (ref 3.5–5.1)
Sodium: 133 mEq/L — ABNORMAL LOW (ref 135–145)

## 2018-12-25 DIAGNOSIS — R69 Illness, unspecified: Secondary | ICD-10-CM | POA: Diagnosis not present

## 2018-12-26 DIAGNOSIS — I771 Stricture of artery: Secondary | ICD-10-CM | POA: Diagnosis not present

## 2018-12-26 DIAGNOSIS — I7409 Other arterial embolism and thrombosis of abdominal aorta: Secondary | ICD-10-CM | POA: Diagnosis not present

## 2018-12-26 DIAGNOSIS — I70211 Atherosclerosis of native arteries of extremities with intermittent claudication, right leg: Secondary | ICD-10-CM | POA: Diagnosis not present

## 2018-12-26 DIAGNOSIS — I739 Peripheral vascular disease, unspecified: Secondary | ICD-10-CM | POA: Diagnosis not present

## 2018-12-27 ENCOUNTER — Other Ambulatory Visit: Payer: Medicare HMO

## 2019-01-02 DIAGNOSIS — N138 Other obstructive and reflux uropathy: Secondary | ICD-10-CM | POA: Diagnosis not present

## 2019-01-02 DIAGNOSIS — N401 Enlarged prostate with lower urinary tract symptoms: Secondary | ICD-10-CM | POA: Diagnosis not present

## 2019-01-02 DIAGNOSIS — R339 Retention of urine, unspecified: Secondary | ICD-10-CM | POA: Diagnosis not present

## 2019-01-03 ENCOUNTER — Other Ambulatory Visit: Payer: Self-pay | Admitting: Family Medicine

## 2019-01-08 DIAGNOSIS — E669 Obesity, unspecified: Secondary | ICD-10-CM | POA: Diagnosis not present

## 2019-01-08 DIAGNOSIS — E785 Hyperlipidemia, unspecified: Secondary | ICD-10-CM | POA: Diagnosis not present

## 2019-01-08 DIAGNOSIS — Z955 Presence of coronary angioplasty implant and graft: Secondary | ICD-10-CM | POA: Diagnosis not present

## 2019-01-08 DIAGNOSIS — Z6841 Body Mass Index (BMI) 40.0 and over, adult: Secondary | ICD-10-CM | POA: Diagnosis not present

## 2019-01-08 DIAGNOSIS — I6523 Occlusion and stenosis of bilateral carotid arteries: Secondary | ICD-10-CM | POA: Diagnosis not present

## 2019-01-08 DIAGNOSIS — I70213 Atherosclerosis of native arteries of extremities with intermittent claudication, bilateral legs: Secondary | ICD-10-CM | POA: Diagnosis not present

## 2019-01-08 DIAGNOSIS — Z87891 Personal history of nicotine dependence: Secondary | ICD-10-CM | POA: Diagnosis not present

## 2019-01-08 DIAGNOSIS — I771 Stricture of artery: Secondary | ICD-10-CM | POA: Diagnosis not present

## 2019-01-08 DIAGNOSIS — I10 Essential (primary) hypertension: Secondary | ICD-10-CM | POA: Diagnosis not present

## 2019-01-08 DIAGNOSIS — E1151 Type 2 diabetes mellitus with diabetic peripheral angiopathy without gangrene: Secondary | ICD-10-CM | POA: Diagnosis not present

## 2019-01-08 DIAGNOSIS — I2510 Atherosclerotic heart disease of native coronary artery without angina pectoris: Secondary | ICD-10-CM | POA: Diagnosis not present

## 2019-01-10 ENCOUNTER — Other Ambulatory Visit: Payer: Self-pay | Admitting: Family Medicine

## 2019-01-10 ENCOUNTER — Encounter: Payer: Self-pay | Admitting: Family Medicine

## 2019-01-10 DIAGNOSIS — I10 Essential (primary) hypertension: Secondary | ICD-10-CM

## 2019-01-16 ENCOUNTER — Encounter: Payer: Self-pay | Admitting: Family Medicine

## 2019-01-17 ENCOUNTER — Encounter: Payer: Self-pay | Admitting: Family Medicine

## 2019-01-17 ENCOUNTER — Other Ambulatory Visit: Payer: Self-pay

## 2019-01-17 ENCOUNTER — Ambulatory Visit (INDEPENDENT_AMBULATORY_CARE_PROVIDER_SITE_OTHER): Payer: Medicare HMO | Admitting: Family Medicine

## 2019-01-17 DIAGNOSIS — J310 Chronic rhinitis: Secondary | ICD-10-CM | POA: Diagnosis not present

## 2019-01-17 DIAGNOSIS — T485X5A Adverse effect of other anti-common-cold drugs, initial encounter: Secondary | ICD-10-CM | POA: Diagnosis not present

## 2019-01-17 MED ORDER — TRIAMCINOLONE ACETONIDE 55 MCG/ACT NA AERO: 2.0000 | INHALATION_SPRAY | Freq: Every day | NASAL | 12 refills | Status: DC

## 2019-01-17 MED ORDER — PREDNISONE 20 MG PO TABS: ORAL_TABLET | ORAL | 0 refills | Status: DC

## 2019-01-17 NOTE — Progress Notes (Signed)
[facility] [address], [place], [place] [address] [phone]  Date:  01/17/2019   Name:  David Singh   DOB:  [birth year]   MRN:  [id number]  PCP:  Darreld Mclean, MD    Chief Complaint: No chief complaint on file.   History of Present Illness:  David Singh is a 68 y.o. very pleasant male patient who presents with the following:  History of DM, CAD/ stent, PAD, hyperlipidemia, HTN, gout Last seen by myself in January We attempted a video visit today, but converted to telephone visit due to technical difficulties Patient is identified by his name and telephone number, and also his voice which I recognize  Amlodipine lipitor buspar Colchicine prn Glipizide Microzide lisinopril Metformin toprol xl protonix  He notes a recurrence or probably has had in the past, his nose 'totally closing up" when he will lay down This is keeping him up at night  He has noted this for about a week now He is using a nasal decongestant spray- he "got hooked on them" and has used for several months. However this seems to be actually making him worse at this time No pressure or pain in his sinuses, he does not feel he has a sinus infection  He also notes sneezing and dry throat No SOB No sinus pressure or pain No fever- he has been monitoring this  He has nasal polyps and if he has any worsening of his nasal status he has a hard time breathing He wants to see ENT for consultation once things are back to normal  We discussed trying prednisone, he notes when he has used this in the past his blood sugar did okay.  Lab Results  Component Value Date   HGBA1C 7.1 (H) 11/09/2018   He is able to check his glucose at home  It has been running 95- 120 at home   His legs are about the same-he continues to have issues with chronic claudication   Lab Results  Component Value Date   HGBA1C 7.1 (H)  11/09/2018     Patient Active Problem List   Diagnosis Date Noted  . Chronic gout 03/09/2016  . Hypertriglyceridemia 01/16/2015  . Type 2 diabetes mellitus ([place]) 01/06/2015  . CAD in native artery 01/06/2015  . History of coronary artery stent placement 01/06/2015  . PAD (peripheral artery disease) ([place]) 01/06/2015  . Hyperlipidemia 01/06/2015  . Essential hypertension 01/06/2015    Past Medical History:  Diagnosis Date  . CAD (coronary artery disease)   . Cancer ([place])    basal cell excision upper right ear 7 yrs ago  . Complication of anesthesia    bad pain with anesthesia induction with colonscopy 6 yrs ago  . Diabetes mellitus without complication ([place])    type 2  . GERD (gastroesophageal reflux disease)   . Hyperlipidemia   . Hypertension   . Peripheral vascular disease ([place])    partial clogged artery right leg  . Sleep apnea    yrs ago sleep study no cpap used    Past Surgical History:  Procedure Laterality Date  . ABDOMINAL AORTOGRAM W/LOWER EXTREMITY N/A 12/27/2016   Procedure: Abdominal Aortogram w/Lower Extremity;  Surgeon: Angelia Mould, MD;  Location: [facility];  Service: Cardiovascular;  Laterality: N/A;  . CARDIAC SURGERY    . CORONARY ANGIOPLASTY WITH STENT PLACEMENT  11/2004   in [place]  . MENISCUS  REPAIR Right   . NASAL SINUS SURGERY  1976  . TONSILLECTOMY      Social History   Tobacco Use  . Smoking status: Former Smoker    Packs/day: 1.50    Years: 26.00    Pack years: 39.00    Types: Cigarettes    Last attempt to quit: 1994    Years since quitting: 26.2  . Smokeless tobacco: Former Network engineer Use Topics  . Alcohol use: Yes    Alcohol/week: 0.0 standard drinks    Comment: 3 ounces on monday  . Drug use: No    Family History  Problem Relation Age of Onset  . Alcohol abuse Brother   . Cancer Brother   . Diabetes Brother   . Hyperlipidemia Brother   . Hypertension Brother   . Diabetes Mother   . Hypertension  Mother   . Hyperlipidemia Mother   . Heart attack Mother   . Hyperlipidemia Father   . Hypertension Father     Allergies  Allergen Reactions  . Dexilant [Dexlansoprazole]     Pressure in stomach and chest    Medication list has been reviewed and updated.  Current Outpatient Medications on File Prior to Visit  Medication Sig Dispense Refill  . amLODipine (NORVASC) 10 MG tablet TAKE 1 TABLET BY MOUTH EVERY DAY 90 tablet 3  . aspirin 81 MG tablet Take 81 mg by mouth daily.    Marland Kitchen atorvastatin (LIPITOR) 80 MG tablet TAKE 1 TABLET BY MOUTH EVERY DAY 90 tablet 3  . Blood Glucose Monitoring Suppl (FREESTYLE LITE) DEVI CHECK BLOOD SUGAR ONCE DAILY  0  . Blood Glucose Monitoring Suppl (FREESTYLE LITE) DEVI CHECK BLOOD SUGAR ONCE DAILY 1 each 0  . busPIRone (BUSPAR) 10 MG tablet TAKE ONE TABLET BY MOUTH TWICE A DAY 180 tablet 3  . Cholecalciferol (VITAMIN D3) 2000 units TABS Take 2,000 Units by mouth daily.    . Coenzyme Q10 300 MG CAPS Take 300 mg by mouth daily.     . colchicine 0.6 MG tablet Take 2 tabs by mouth now and then repeat in 1 hour.  May repeat tomorrow if not improved. 6 tablet 0  . Cyanocobalamin (VITAMIN B-12 PO) Take 2,500 mg by mouth daily.    Marland Kitchen glipiZIDE (GLUCOTROL XL) 2.5 MG 24 hr tablet TAKE 1 TABLET (2.5 MG TOTAL) BY MOUTH DAILY WITH BREAKFAST. 90 tablet 1  . glucose blood (FREESTYLE LITE) test strip Check blood sugar 1-3x daily 100 each 12  . glucose blood (FREESTYLE LITE) test strip CHECK BLOOD SUGAR ONCE DAILY 100 each 2  . hydrochlorothiazide (MICROZIDE) 12.5 MG capsule Take 1 capsule (12.5 mg total) by mouth daily. 90 capsule 3  . Lancets (FREESTYLE) lancets Check blood sugar once daily 100 each 12  . lisinopril (PRINIVIL,ZESTRIL) 40 MG tablet Take 1 tablet (40 mg total) by mouth daily. 90 tablet 3  . metFORMIN (GLUCOPHAGE) 1000 MG tablet TAKE 1 TABLET (1,000 MG TOTAL) BY MOUTH 2 (TWO) TIMES DAILY WITH A MEAL. 180 tablet 3  . metoprolol succinate (TOPROL-XL) 100 MG 24  hr tablet TAKE 1 TABLET (100 MG TOTAL) BY MOUTH DAILY. TAKE WITH OR IMMEDIATELY FOLLOWING A MEAL. 90 tablet 1  . MULTIPLE VITAMIN PO Take 1 tablet by mouth daily.     . niacin 500 MG tablet Take 500 mg by mouth every morning.     . Omega-3 Fatty Acids (EQL OMEGA 3 FISH OIL) 1000 MG CAPS Take 1 capsule by mouth 2 (two) times  daily.    . pantoprazole (PROTONIX) 40 MG tablet TAKE 1 TABLET BY MOUTH EVERY DAY 90 tablet 1  . prednisoLONE acetate (PRED FORTE) 1 % ophthalmic suspension INSTILL 1 DROP INTO LEFT EYE TWICE A DAY    . Probiotic Product (PROBIOTIC ADVANCED PO) Take by mouth every evening. [employer]co brand takes 2 tabs    . UNABLE TO FIND Take 500 mcg by mouth daily. Med Name: Vitamin K2    . UNABLE TO FIND Med Name: Natural MK-7-100 MCG WITH MK-4-100MG      No current facility-administered medications on file prior to visit.     Review of Systems: No fever chills  Physical Examination: Pt sounds well, no SOB apparent   Assessment and Plan: Rhinitis medicamentosa - Plan: triamcinolone (NASACORT) 55 MCG/ACT AERO nasal inhaler, predniSONE (DELTASONE) 20 MG tablet  Patient with concern of rhinitis medicamentosa due to chronic use of nasal decongestants. We will have him start a nasal steroid spray, and also do a low-dose of oral prednisone for 5 to 7 days. I have cautioned him that this may increase his blood sugar.  He will let me know if sugars are going higher than 300 I encouraged him to use the prednisone to allow him to get off of Afrin.  He plans to do so Also suggested that he pick up some Claritin or Zyrtec over-the-counter  I spent approximately 8 minutes on the phone with patient today Signed Lamar Blinks, MD

## 2019-01-17 NOTE — Patient Instructions (Addendum)
It was great to talk to you today. Please come and see me for a routine diabetes check once things are back to normal, perhaps June I sent in two prescriptions for you today.  One is for a nasal steroid spray called Nasacort.  The other is oral prednisone.  Take 1 prednisone pill daily for 5 to 7 days, depending how long you feel that you need it. While you are on the prednisone, your nasal symptoms should be reduced.  Please use this opportunity to come off Afrin spray. I agree that having you see ear nose and throat when possible is our next move  While you are taking prednisone, avoid taking anti-inflammatory medications such as ibuprofen or Aleve.  This combination can really irritate your stomach  Let me know if any questions, or if your blood sugar is going excessively high while on prednisone Also, over-the-counter Claritin or Zyrtec may be helpful

## 2019-01-26 DIAGNOSIS — E669 Obesity, unspecified: Secondary | ICD-10-CM | POA: Diagnosis not present

## 2019-01-26 DIAGNOSIS — Z7982 Long term (current) use of aspirin: Secondary | ICD-10-CM | POA: Diagnosis not present

## 2019-01-26 DIAGNOSIS — I70213 Atherosclerosis of native arteries of extremities with intermittent claudication, bilateral legs: Secondary | ICD-10-CM | POA: Diagnosis not present

## 2019-01-26 DIAGNOSIS — Z7902 Long term (current) use of antithrombotics/antiplatelets: Secondary | ICD-10-CM | POA: Diagnosis not present

## 2019-01-26 DIAGNOSIS — I10 Essential (primary) hypertension: Secondary | ICD-10-CM | POA: Diagnosis not present

## 2019-03-16 ENCOUNTER — Other Ambulatory Visit: Payer: Self-pay | Admitting: Family Medicine

## 2019-03-16 DIAGNOSIS — R69 Illness, unspecified: Secondary | ICD-10-CM | POA: Diagnosis not present

## 2019-03-20 ENCOUNTER — Encounter: Payer: Self-pay | Admitting: Family Medicine

## 2019-03-21 ENCOUNTER — Ambulatory Visit (INDEPENDENT_AMBULATORY_CARE_PROVIDER_SITE_OTHER): Payer: Medicare HMO | Admitting: Medical

## 2019-03-21 ENCOUNTER — Other Ambulatory Visit: Payer: Self-pay

## 2019-03-21 ENCOUNTER — Encounter: Payer: Self-pay | Admitting: Medical

## 2019-03-21 VITALS — BP 156/96 | HR 82

## 2019-03-21 DIAGNOSIS — I10 Essential (primary) hypertension: Secondary | ICD-10-CM

## 2019-03-21 DIAGNOSIS — R195 Other fecal abnormalities: Secondary | ICD-10-CM | POA: Diagnosis not present

## 2019-03-21 MED ORDER — CHLORTHALIDONE 25 MG PO TABS: 25.0000 mg | ORAL_TABLET | Freq: Every day | ORAL | 2 refills | Status: DC

## 2019-03-21 NOTE — Progress Notes (Signed)
Subjective:    Patient ID: David Singh, male    DOB: Jul 24, 1951, 68 y.o.   MRN: [id number]  HPI  Virtual Visit via Video Note  I connected with David Singh on 03/21/19 at  1:20 PM EDT by a video enabled telemedicine application and verified that I am speaking with the correct person using two identifiers.  Location: Patient: home Provider: home   I discussed the limitations of evaluation and management by telemedicine and the availability of in person appointments. The patient expressed understanding and agreed to proceed.  History of Present Illness:   Pt states on Friday had heart burn. He tried anti acid tablet. He then tried some liquid antiacid which stopped heart burn fast. He thinks medication had magnesium in it. Next day he had a loose stool. Saturday and Sunday had milld watery loose stool. Today small loose stool.(monday had 5 loose stools) No recent antibiotics. No fever, no chills, no body aches. Pt wife and son have no loose stools. No cough or chest congestion.   Pt bp has been consistently around 150/90 recently over past week.       I discussed the limitations of evaluation and management by telemedicine and the availability of in person appointments. The patient expressed understanding and agreed to proceed.    Assessment and Plan: Loose stools recently might be viral gastroenteritis versus side effect from product with magnesium.  Improvement over the last 24 hours.  Would recommend bland diet guidelines and avoid any greasy foods, fried foods, fruit juices or dairy products.  If you continue to have loose stools-3 or more tomorrow afternoon then would recommend picking up stool panel kit to start testing.  If you do need to pick up the kit then at that point you can start Imodium over-the-counter.  Also can hydrate with Gatorade 0.  Your blood pressure has been elevated recently in the 150/90 range consistently.  I do think it is a good idea  for you to stop the HCTZ and switch to 25 mg of chlorthalidone.  Make sure that she eat potassium rich foods while on diuretic.  Presently would recommend starting chlorthalidone on Friday if diarrhea is resolving.  Follow-up 7 to 10 days or as needed.  Mackie Pai, PA-C  Follow Up Instructions:    I discussed the assessment and treatment plan with the patient. The patient was provided an opportunity to ask questions and all were answered. The patient agreed with the plan and demonstrated an understanding of the instructions.   The patient was advised to call back or seek an in-person evaluation if the symptoms worsen or if the condition fails to improve as anticipated.  I provided 25 minutes of non-face-to-face time during this encounter.   Mackie Pai, PA-C   Review of Systems  Constitutional: Negative for chills, fatigue and fever.  HENT: Negative for congestion and drooling.   Respiratory: Negative for cough, chest tightness, shortness of breath and wheezing.   Cardiovascular: Negative for chest pain and palpitations.  Gastrointestinal: Positive for diarrhea. Negative for abdominal distention, abdominal pain, constipation, nausea and vomiting.  Genitourinary: Negative for dysuria.  Musculoskeletal: Negative for back pain, myalgias and neck stiffness.  Skin: Negative for rash.  Neurological: Negative for dizziness, seizures, weakness and headaches.  Hematological: Negative for adenopathy. Does not bruise/bleed easily.  Psychiatric/Behavioral: Negative for behavioral problems and confusion.       Objective:   Physical Exam        Assessment & Plan:

## 2019-03-21 NOTE — Patient Instructions (Addendum)
Loose stools recently might be viral gastroenteritis versus side effect from product with magnesium.  Improvement over the last 24 hours.  Would recommend bland diet guidelines and avoid any greasy foods, fried foods, fruit juices or dairy products.  If you continue to have loose stools-3 or more tomorrow afternoon then would recommend picking up stool panel kit to start testing.  If you do need to pick up the kit then at that point you can start Imodium over-the-counter.  Also can hydrate with Gatorade 0.  Your blood pressure has been elevated recently in the 150/90 range consistently.  I do think it is a good idea for you to stop the HCTZ and switch to 25 mg of chlorthalidone.  Make sure that she eat potassium rich foods while on diuretic.  Presently would recommend starting chlorthalidone on Friday if diarrhea is resolving.  Follow-up 7 to 10 days or as needed.  Pt will update me by my chart if he is coming in for stool kit. Will let lab know and see if he or wife needs to pick up.

## 2019-03-22 ENCOUNTER — Encounter: Payer: Self-pay | Admitting: Medical

## 2019-03-26 ENCOUNTER — Other Ambulatory Visit: Payer: Self-pay | Admitting: Family Medicine

## 2019-03-26 DIAGNOSIS — I10 Essential (primary) hypertension: Secondary | ICD-10-CM

## 2019-04-02 ENCOUNTER — Ambulatory Visit (INDEPENDENT_AMBULATORY_CARE_PROVIDER_SITE_OTHER): Payer: Medicare HMO | Admitting: Medical

## 2019-04-02 ENCOUNTER — Encounter: Payer: Self-pay | Admitting: Medical

## 2019-04-02 ENCOUNTER — Other Ambulatory Visit: Payer: Self-pay

## 2019-04-02 VITALS — BP 130/80 | HR 70 | Temp 98.7°F | Ht 69.0 in | Wt 289.4 lb

## 2019-04-02 DIAGNOSIS — I10 Essential (primary) hypertension: Secondary | ICD-10-CM

## 2019-04-02 NOTE — Progress Notes (Signed)
Subjective:    Patient ID: David Singh, male    DOB: [birth year], 68 y.o.   MRN: [id number]  HPI  Pt states he is doing ok overall.  Pt states his bp is little bit improved. His bp was high and I added chlorthalidone 25 to replace hctz 12. He state when he was on 25 his bp was on lower side. He is also on amlodipine 10 mg daily. He states systolic138 approximate. diastolic 27-51.  Pt states some stress related to rt lower extremity claudication. Pt is seeing vascular surgeon for this.  Pt states looses stools much better. This morning did have solid stool. Last colonoscopy.   Review of Systems  Constitutional: Negative for chills, fatigue and fever.  Respiratory: Negative for cough, chest tightness, shortness of breath and wheezing.   Cardiovascular: Negative for chest pain and palpitations.  Gastrointestinal: Negative for abdominal pain, nausea and vomiting.  Musculoskeletal: Negative for back pain.  Skin: Negative for rash.  Neurological: Negative for dizziness, speech difficulty, weakness, numbness and headaches.  Hematological: Negative for adenopathy. Does not bruise/bleed easily.  Psychiatric/Behavioral: Negative for behavioral problems, decreased concentration and suicidal ideas. The patient is not nervous/anxious.     Past Medical History:  Diagnosis Date  . CAD (coronary artery disease)   . Cancer ([place])    basal cell excision upper right ear 7 yrs ago  . Complication of anesthesia    bad pain with anesthesia induction with colonscopy 6 yrs ago  . Diabetes mellitus without complication ([place])    type 2  . GERD (gastroesophageal reflux disease)   . Hyperlipidemia   . Hypertension   . Peripheral vascular disease ([place])    partial clogged artery right leg  . Sleep apnea    yrs ago sleep study no cpap used     Social History   Socioeconomic History  . Marital status: Married    Spouse name: Not on file  . Number of children: 2  . Years of education:  Not on file  . Highest education level: Not on file  Occupational History  . Not on file  Social Needs  . Financial resource strain: Not on file  . Food insecurity:    Worry: Not on file    Inability: Not on file  . Transportation needs:    Medical: Not on file    Non-medical: Not on file  Tobacco Use  . Smoking status: Former Smoker    Packs/day: 1.50    Years: 26.00    Pack years: 39.00    Types: Cigarettes    Last attempt to quit: 1994    Years since quitting: 26.4  . Smokeless tobacco: Former Network engineer and Sexual Activity  . Alcohol use: Yes    Alcohol/week: 0.0 standard drinks    Comment: 3 ounces on monday  . Drug use: No  . Sexual activity: Yes  Lifestyle  . Physical activity:    Days per week: Not on file    Minutes per session: Not on file  . Stress: Not on file  Relationships  . Social connections:    Talks on phone: Not on file    Gets together: Not on file    Attends religious service: Not on file    Active member of club or organization: Not on file    Attends meetings of clubs or organizations: Not on file    Relationship status: Not on file  . Intimate partner violence:    Fear  of current or ex partner: Not on file    Emotionally abused: Not on file    Physically abused: Not on file    Forced sexual activity: Not on file  Other Topics Concern  . Not on file  Social History Narrative  . Not on file    Past Surgical History:  Procedure Laterality Date  . ABDOMINAL AORTOGRAM W/LOWER EXTREMITY N/A 12/27/2016   Procedure: Abdominal Aortogram w/Lower Extremity;  Surgeon: Angelia Mould, MD;  Location: [facility];  Service: Cardiovascular;  Laterality: N/A;  . CARDIAC SURGERY    . CORONARY ANGIOPLASTY WITH STENT PLACEMENT  11/2004   in [place]  . MENISCUS REPAIR Right   . NASAL SINUS SURGERY  1976  . TONSILLECTOMY      Family History  Problem Relation Age of Onset  . Alcohol abuse Brother   . Cancer Brother   . Diabetes  Brother   . Hyperlipidemia Brother   . Hypertension Brother   . Diabetes Mother   . Hypertension Mother   . Hyperlipidemia Mother   . Heart attack Mother   . Hyperlipidemia Father   . Hypertension Father     Allergies  Allergen Reactions  . Dexilant [Dexlansoprazole]     Pressure in stomach and chest    Current Outpatient Medications on File Prior to Visit  Medication Sig Dispense Refill  . amLODipine (NORVASC) 10 MG tablet TAKE 1 TABLET BY MOUTH EVERY DAY 90 tablet 3  . aspirin 81 MG tablet Take 81 mg by mouth daily.    Marland Kitchen atorvastatin (LIPITOR) 80 MG tablet TAKE 1 TABLET BY MOUTH EVERY DAY 90 tablet 3  . Blood Glucose Monitoring Suppl (FREESTYLE LITE) DEVI CHECK BLOOD SUGAR ONCE DAILY  0  . Blood Glucose Monitoring Suppl (FREESTYLE LITE) DEVI CHECK BLOOD SUGAR ONCE DAILY 1 each 0  . busPIRone (BUSPAR) 10 MG tablet TAKE ONE TABLET BY MOUTH TWICE A DAY 180 tablet 3  . chlorthalidone (HYGROTON) 25 MG tablet Take 1 tablet (25 mg total) by mouth daily. 30 tablet 2  . Cholecalciferol (VITAMIN D3) 2000 units TABS Take 2,000 Units by mouth daily.    . Coenzyme Q10 300 MG CAPS Take 300 mg by mouth daily.     . colchicine 0.6 MG tablet Take 2 tabs by mouth now and then repeat in 1 hour.  May repeat tomorrow if not improved. 6 tablet 0  . Cyanocobalamin (VITAMIN B-12 PO) Take 2,500 mg by mouth daily.    Marland Kitchen glipiZIDE (GLUCOTROL XL) 2.5 MG 24 hr tablet TAKE 1 TABLET (2.5 MG TOTAL) BY MOUTH DAILY WITH BREAKFAST. 90 tablet 1  . glucose blood (FREESTYLE LITE) test strip Check blood sugar 1-3x daily 100 each 12  . glucose blood (FREESTYLE LITE) test strip CHECK BLOOD SUGAR ONCE DAILY 100 each 2  . hydrochlorothiazide (MICROZIDE) 12.5 MG capsule Take 1 capsule (12.5 mg total) by mouth daily. 90 capsule 3  . Lancets (FREESTYLE) lancets Check blood sugar once daily 100 each 12  . lisinopril (PRINIVIL,ZESTRIL) 40 MG tablet Take 1 tablet (40 mg total) by mouth daily. 90 tablet 3  . lisinopril (ZESTRIL)  10 MG tablet TAKE 1 TABLET BY MOUTH EVERY DAY 90 tablet 1  . metFORMIN (GLUCOPHAGE) 1000 MG tablet TAKE 1 TABLET (1,000 MG TOTAL) BY MOUTH 2 (TWO) TIMES DAILY WITH A MEAL. 180 tablet 3  . metoprolol succinate (TOPROL-XL) 100 MG 24 hr tablet TAKE 1 TABLET (100 MG TOTAL) BY MOUTH DAILY. TAKE WITH OR  IMMEDIATELY FOLLOWING A MEAL. 90 tablet 1  . MULTIPLE VITAMIN PO Take 1 tablet by mouth daily.     . niacin 500 MG tablet Take 500 mg by mouth every morning.     . Omega-3 Fatty Acids (EQL OMEGA 3 FISH OIL) 1000 MG CAPS Take 1 capsule by mouth 2 (two) times daily.    [name] VERIO test strip USE TO TEST BLOOD SUGAR 1-3 TIMES DAILY 100 each 3  . pantoprazole (PROTONIX) 40 MG tablet TAKE 1 TABLET BY MOUTH EVERY DAY 90 tablet 1  . prednisoLONE acetate (PRED FORTE) 1 % ophthalmic suspension INSTILL 1 DROP INTO LEFT EYE TWICE A DAY    . predniSONE (DELTASONE) 20 MG tablet Take 1 daily for 5-7 days for nasal congestion 7 tablet 0  . Probiotic Product (PROBIOTIC ADVANCED PO) Take by mouth every evening. [employer]co brand takes 2 tabs    . triamcinolone (NASACORT) 55 MCG/ACT AERO nasal inhaler Place 2 sprays into the nose daily. 1 Inhaler 12  . UNABLE TO FIND Take 500 mcg by mouth daily. Med Name: Vitamin K2    . UNABLE TO FIND Med Name: Natural MK-7-100 MCG WITH MK-4-100MG      No current facility-administered medications on file prior to visit.     BP 138/80   Pulse 70   Temp 98.7 F (37.1 C) (Oral)   Ht 5\' 9"  (1.753 m)   Wt 289 lb 6.4 oz (131.3 kg)   SpO2 99%   BMI 42.74 kg/m       Objective:   Physical Exam  General Mental Status- Alert. General Appearance- Not in acute distress.   Skin General: Color- Normal Color. Moisture- Normal Moisture.  Neck Carotid Arteries- Normal color. Moisture- Normal Moisture. No carotid bruits. No JVD.  Chest and Lung Exam Auscultation: Breath Sounds:-Normal.  Cardiovascular Auscultation:Rythm- Regular. Murmurs & Other Heart Sounds:Auscultation of the  heart reveals- No Murmurs.  Neurologic Cranial Nerve exam:- CN III-XII intact(No nystagmus), symmetric smile. Strength:- 5/5 equal and symmetric strength both upper and lower extremities.      Assessment & Plan:  Your bp is well controlled today. You do have some variation. But when you check try to relax and check bp on 3 consecutive time if necessary.  Would like to see majority of you bp levels close to 130/80 range.  Glad to hear your loose stools have improve/resolved.  Follow up in about one week with Dr. Edilia Bo as scheduled or with me as needed  Mackie Pai, PA-C

## 2019-04-02 NOTE — Patient Instructions (Addendum)
Your bp is well controlled today. You do have some variation. But when you check try to relax and check bp on 3 consecutive time if necessary.  Would like to see majority of you bp levels close to 130/80 range.  Glad to hear your loose stools have improve/resolved.  Follow up in about one week with Dr. Edilia Bo as scheduled or with me as needed

## 2019-04-14 DIAGNOSIS — R69 Illness, unspecified: Secondary | ICD-10-CM | POA: Diagnosis not present

## 2019-05-01 ENCOUNTER — Encounter: Payer: Self-pay | Admitting: Family Medicine

## 2019-05-02 DIAGNOSIS — R339 Retention of urine, unspecified: Secondary | ICD-10-CM | POA: Diagnosis not present

## 2019-05-02 DIAGNOSIS — N401 Enlarged prostate with lower urinary tract symptoms: Secondary | ICD-10-CM | POA: Diagnosis not present

## 2019-05-02 DIAGNOSIS — N138 Other obstructive and reflux uropathy: Secondary | ICD-10-CM | POA: Diagnosis not present

## 2019-05-02 NOTE — Progress Notes (Addendum)
[facility] [place], [place], [place] [address] [phone]  Date:  05/03/2019   Name:  David Singh   DOB:  [birth year]   MRN:  [id number]  PCP:  Darreld Mclean, MD    Chief Complaint: Hypertension (6 month follow up), Diabetes, and Neck Pain (radiates from neck to left arm, neck pops often, no cardiac sxs)   History of Present Illness:  David Singh is a 68 y.o. very pleasant male patient who presents with the following:  Here today for 66-month follow-up visit History of gout, hyperlipidemia, diabetes, CAD status post stent in 2006, PVD, hyperlipidemia, hypertension He had a visit with Percell Miller about a month ago to follow-up on his blood pressure-I do not think they made any medication changes at that time.  However chlorthalidone was added to his regimen earlier in May  His vascular surgeon is Dr. Radene Knee with [facility] Cardiologist is Dr. Stanford Breed Urology- Dr. Felipa Eth  His urologist wishes to do an operation to reduce the size of his prostate. He does not have any cancer-just obstructive symptoms They do want a CBC and PT, PTT prior to operation.  I can draw these today  Amlodipine 10 Toprol-XL 100 Aspirin 81 Lipitor Omega 3 Chlorthalidone 25 BuSpar Glipizide 2.5 HCTZ 12-1/2 Lisinopril 10 Metformin 1000 twice daily  Due for an A1c today- will also check his clotting factors today Eye exam: he is due, will have this done soon He is fasting today  As usual, David Singh's main issue is his peripheral vascular disease.  He is frustrated by this, and wishes he could be more active He is really not able to walk very far due to his legs- his exercise tolerance continues to decrease.  He gets pain in both legs, the right is the worst of the two The pain is such that it stops him in his tracks- it takes so long for the pain to resolve that a brief rest does not allow him to continue.  However  he does find that if he stops frequently, such as in a grocery store, he does better Per DR. Chang the operation to correct his would be dangerous and likely ill advised  He also notes a pain in the midline of his neck that can radiate into his left upper arm on occasion Present for about one week NKI- seemed to come out of the blue He feels like it is muscular  No chest pain or pressure, no heaviness, no SOB No increase in symptoms with exercise  Lab Results  Component Value Date   HGBA1C 7.1 (H) 11/09/2018    Patient Active Problem List   Diagnosis Date Noted  . Chronic gout 03/09/2016  . Hypertriglyceridemia 01/16/2015  . Type 2 diabetes mellitus ([place]) 01/06/2015  . CAD in native artery 01/06/2015  . History of coronary artery stent placement 01/06/2015  . PAD (peripheral artery disease) ([place]) 01/06/2015  . Hyperlipidemia 01/06/2015  . Essential hypertension 01/06/2015    Past Medical History:  Diagnosis Date  . CAD (coronary artery disease)   . Cancer ([place])    basal cell excision upper right ear 7 yrs ago  . Complication of anesthesia    bad pain with anesthesia induction with colonscopy 6 yrs ago  . Diabetes mellitus without complication ([place])    type 2  . GERD (gastroesophageal reflux disease)   . Hyperlipidemia   . Hypertension   . Peripheral  vascular disease ([place])    partial clogged artery right leg  . Sleep apnea    yrs ago sleep study no cpap used    Past Surgical History:  Procedure Laterality Date  . ABDOMINAL AORTOGRAM W/LOWER EXTREMITY N/A 12/27/2016   Procedure: Abdominal Aortogram w/Lower Extremity;  Surgeon: Angelia Mould, MD;  Location: [facility];  Service: Cardiovascular;  Laterality: N/A;  . CARDIAC SURGERY    . CORONARY ANGIOPLASTY WITH STENT PLACEMENT  11/2004   in [place]  . MENISCUS REPAIR Right   . NASAL SINUS SURGERY  1976  . TONSILLECTOMY      Social History   Tobacco Use  . Smoking status: Former Smoker     Packs/day: 1.50    Years: 26.00    Pack years: 39.00    Types: Cigarettes    Quit date: 1994    Years since quitting: 26.5  . Smokeless tobacco: Former Network engineer Use Topics  . Alcohol use: Yes    Alcohol/week: 0.0 standard drinks    Comment: 3 ounces on monday  . Drug use: No    Family History  Problem Relation Age of Onset  . Alcohol abuse Brother   . Cancer Brother   . Diabetes Brother   . Hyperlipidemia Brother   . Hypertension Brother   . Diabetes Mother   . Hypertension Mother   . Hyperlipidemia Mother   . Heart attack Mother   . Hyperlipidemia Father   . Hypertension Father     Allergies  Allergen Reactions  . Dexilant [Dexlansoprazole]     Pressure in stomach and chest    Medication list has been reviewed and updated.  Current Outpatient Medications on File Prior to Visit  Medication Sig Dispense Refill  . amLODipine (NORVASC) 10 MG tablet TAKE 1 TABLET BY MOUTH EVERY DAY 90 tablet 3  . aspirin 81 MG tablet Take 81 mg by mouth daily.    Marland Kitchen atorvastatin (LIPITOR) 80 MG tablet TAKE 1 TABLET BY MOUTH EVERY DAY 90 tablet 3  . Blood Glucose Monitoring Suppl (FREESTYLE LITE) DEVI CHECK BLOOD SUGAR ONCE DAILY  0  . Blood Glucose Monitoring Suppl (FREESTYLE LITE) DEVI CHECK BLOOD SUGAR ONCE DAILY 1 each 0  . busPIRone (BUSPAR) 10 MG tablet TAKE ONE TABLET BY MOUTH TWICE A DAY 180 tablet 3  . chlorthalidone (HYGROTON) 25 MG tablet Take 1 tablet (25 mg total) by mouth daily. 30 tablet 2  . Cholecalciferol (VITAMIN D3) 2000 units TABS Take 2,000 Units by mouth daily.    . Coenzyme Q10 300 MG CAPS Take 300 mg by mouth daily.     . colchicine 0.6 MG tablet Take 2 tabs by mouth now and then repeat in 1 hour.  May repeat tomorrow if not improved. 6 tablet 0  . Cyanocobalamin (VITAMIN B-12 PO) Take 2,500 mg by mouth daily.    Marland Kitchen glipiZIDE (GLUCOTROL XL) 2.5 MG 24 hr tablet TAKE 1 TABLET (2.5 MG TOTAL) BY MOUTH DAILY WITH BREAKFAST. 90 tablet 1  . glucose blood (FREESTYLE  LITE) test strip Check blood sugar 1-3x daily 100 each 12  . glucose blood (FREESTYLE LITE) test strip CHECK BLOOD SUGAR ONCE DAILY 100 each 2  . hydrochlorothiazide (MICROZIDE) 12.5 MG capsule Take 1 capsule (12.5 mg total) by mouth daily. 90 capsule 3  . Lancets (FREESTYLE) lancets Check blood sugar once daily 100 each 12  . lisinopril (PRINIVIL,ZESTRIL) 40 MG tablet Take 1 tablet (40 mg total) by mouth daily. [place]  tablet 3  . lisinopril (ZESTRIL) 10 MG tablet TAKE 1 TABLET BY MOUTH EVERY DAY 90 tablet 1  . metFORMIN (GLUCOPHAGE) 1000 MG tablet TAKE 1 TABLET (1,000 MG TOTAL) BY MOUTH 2 (TWO) TIMES DAILY WITH A MEAL. 180 tablet 3  . metoprolol succinate (TOPROL-XL) 100 MG 24 hr tablet TAKE 1 TABLET (100 MG TOTAL) BY MOUTH DAILY. TAKE WITH OR IMMEDIATELY FOLLOWING A MEAL. 90 tablet 1  . MULTIPLE VITAMIN PO Take 1 tablet by mouth daily.     . niacin 500 MG tablet Take 500 mg by mouth every morning.     . Omega-3 Fatty Acids (EQL OMEGA 3 FISH OIL) 1000 MG CAPS Take 1 capsule by mouth 2 (two) times daily.    [name] VERIO test strip USE TO TEST BLOOD SUGAR 1-3 TIMES DAILY 100 each 3  . pantoprazole (PROTONIX) 40 MG tablet TAKE 1 TABLET BY MOUTH EVERY DAY 90 tablet 1  . prednisoLONE acetate (PRED FORTE) 1 % ophthalmic suspension INSTILL 1 DROP INTO LEFT EYE TWICE A DAY    . predniSONE (DELTASONE) 20 MG tablet Take 1 daily for 5-7 days for nasal congestion 7 tablet 0  . Probiotic Product (PROBIOTIC ADVANCED PO) Take by mouth every evening. [employer]co brand takes 2 tabs    . triamcinolone (NASACORT) 55 MCG/ACT AERO nasal inhaler Place 2 sprays into the nose daily. 1 Inhaler 12  . UNABLE TO FIND Take 500 mcg by mouth daily. Med Name: Vitamin K2    . UNABLE TO FIND Med Name: Natural MK-7-100 MCG WITH MK-4-100MG      No current facility-administered medications on file prior to visit.     Review of Systems:  As per HPI- otherwise negative. No fever or chills  Physical Examination: Vitals:   05/03/19  0838  BP: 134/72  Pulse: 82  Resp: 17  Temp: 98 F (36.7 C)  SpO2: 97%   Vitals:   05/03/19 0838  Weight: 287 lb (130.2 kg)  Height: 5\' 9"  (1.753 m)   Body mass index is 42.38 kg/m. Ideal Body Weight: Weight in (lb) to have BMI = 25: 168.9  GEN: WDWN, NAD, Non-toxic, A & O x 3, obese, looks well HEENT: Atraumatic, Normocephalic. Neck supple. No masses, No LAD.  TM within normal limits bilaterally Ears and Nose: No external deformity. CV: RRR, No M/G/R. No JVD. No thrill. No extra heart sounds. PULM: CTA B, no wheezes, crackles, rhonchi. No retractions. No resp. distress. No accessory muscle use. ABD: S, NT, ND, +BS. No rebound. No HSM. EXTR: No c/c/e NEURO Normal gait.  PSYCH: Normally interactive. Conversant. Not depressed or anxious appearing.  Calm demeanor.  Neck: No bony tenderness to palpation.  Normal range of motion of the neck He is tender in the left paracervical musculature, and the left superior trapezius.  Normal range of motion of the left shoulder, does not reproduce pain.  Normal strength, sensation, DTR of both upper extremities  Assessment and Plan:   ICD-10-CM   1. Essential hypertension  I10 CBC    Comprehensive metabolic panel  2. Dyslipidemia  E78.5 Lipid panel  3. Type 2 diabetes mellitus without complication, without long-term current use of insulin (HCC)  E11.9 Hemoglobin A1c  4. PAD (peripheral artery disease) (HCC)  I73.9 PTT    Protime-INR  5. Strain of left trapezius muscle, initial encounter  S46.812A DG Cervical Spine Complete    cyclobenzaprine (FLEXERIL) 10 MG tablet   Following up on some chronic issues today Blood pressures under  good control on current regimen Check A1c, lipids CBC, clotting factors in preparation for upcoming prostate operation Discussed his neck pain with him in detail.  [name] vehemently denies any chest pain or pressure, any shortness of breath I am able to reproduce his tenderness, and do agree this seems to be  muscular.  Will obtain plain films today, and have him use Flexeril as needed for pain However I cautioned him that there are other important structures in the neck, and if he has any worsening of his symptoms or if not getting better he needs to seek care  Follow-up: No follow-ups on file.  Meds ordered this encounter  Medications  . cyclobenzaprine (FLEXERIL) 10 MG tablet    Sig: Take 1 tablet (10 mg total) by mouth 2 (two) times daily as needed for muscle spasms.    Dispense:  30 tablet    Refill:  0   Orders Placed This Encounter  Procedures  . DG Cervical Spine Complete  . CBC  . Comprehensive metabolic panel  . Hemoglobin A1c  . Lipid panel  . PTT  . Protime-INR     Signed Lamar Blinks, MD  Received his results as below; called upon receipt of low sodium and left detailed message on machine I believe chlorthalidone was added to his regimen in May.  He was supposed to stop HCTZ at that time, but is possible he is taking both.  Please stop chlorthalidone and HCTZ if taking Please increase dietary salt intake and restrict water intake We will need to repeat sodium at the beginning next week If not feeling well in any way, please seek emergency care  Also sent mychart message to patient  Results for orders placed or performed in visit on 05/03/19  CBC  Result Value Ref Range   WBC 6.3 4.0 - 10.5 K/uL   RBC 4.71 4.22 - 5.81 Mil/uL   Platelets 225.0 150.0 - 400.0 K/uL   Hemoglobin 13.3 13.0 - 17.0 g/dL   HCT 39.1 39.0 - 52.0 %   MCV 83.0 78.0 - 100.0 fl   MCHC 34.0 30.0 - 36.0 g/dL   RDW 14.0 11.5 - 15.5 %  Comprehensive metabolic panel  Result Value Ref Range   Sodium 123 (L) 135 - 145 mEq/L   Potassium 4.6 3.5 - 5.1 mEq/L   Chloride 87 (L) 96 - 112 mEq/L   CO2 28 19 - 32 mEq/L   Glucose, Bld 122 (H) 70 - 99 mg/dL   BUN 11 6 - 23 mg/dL   Creatinine, Ser 0.82 0.40 - 1.50 mg/dL   Total Bilirubin 0.5 0.2 - 1.2 mg/dL   Alkaline Phosphatase 51 39 - 117 U/L    AST 26 0 - 37 U/L   ALT 34 0 - 53 U/L   Total Protein 7.1 6.0 - 8.3 g/dL   Albumin 4.6 3.5 - 5.2 g/dL   Calcium 9.6 8.4 - 10.5 mg/dL   GFR 93.61 >60.00 mL/min  Hemoglobin A1c  Result Value Ref Range   Hgb A1c MFr Bld 6.9 (H) 4.6 - 6.5 %  Lipid panel  Result Value Ref Range   Cholesterol 127 0 - 200 mg/dL   Triglycerides 179.0 (H) 0.0 - 149.0 mg/dL   HDL 37.40 (L) >39.00 mg/dL   VLDL 35.8 0.0 - 40.0 mg/dL   LDL Cholesterol 54 0 - 99 mg/dL   Total CHOL/HDL Ratio 3    NonHDL 89.73   PTT  Result Value Ref Range   aPTT 30.4  23.4 - 32.7 SEC  Protime-INR  Result Value Ref Range   INR 0.9 0.8 - 1.0 ratio   Prothrombin Time 11.1 9.6 - 13.1 sec   Dg Cervical Spine Complete  Result Date: 05/03/2019 CLINICAL DATA:  Neck pain for 1 week. No known injury. Initial encounter. EXAM: CERVICAL SPINE - COMPLETE 4+ VIEW COMPARISON:  04/15/2018 radiographs FINDINGS: No acute fracture, subluxation or prevertebral soft tissue swelling. Mild-to-moderate degenerative disc disease and spondylosis again noted. Bony foraminal narrowing is identified on the LEFT at C3-4, C4-5, and C6-7. No suspicious focal bony lesions are identified. IMPRESSION: No acute abnormality. Mild-to-moderate degenerative disc disease/spondylosis with LEFT bony foraminal narrowing as described. Electronically Signed   By: Margarette Canada M.D.   On: 05/03/2019 14:37   Call patient back and we were able to connect on the phone.  He is also not sure if he is taking hydrochlorothiazide right now, he will check his pill bottles and let me know He feels fine, no symptoms of hyponatremia are apparent.  We will plan to recheck his sodium on Monday.  He will watch carefully for any signs of mental status change or other symptoms, will also inform his wife keep an eye on him.  If any concerns they will go to the emergency room over the weekend

## 2019-05-03 ENCOUNTER — Other Ambulatory Visit: Payer: Self-pay

## 2019-05-03 ENCOUNTER — Encounter: Payer: Self-pay | Admitting: Family Medicine

## 2019-05-03 ENCOUNTER — Ambulatory Visit (HOSPITAL_BASED_OUTPATIENT_CLINIC_OR_DEPARTMENT_OTHER)
Admission: RE | Admit: 2019-05-03 | Discharge: 2019-05-03 | Disposition: A | Discharge status: Home / Self Care | Payer: Medicare HMO | Source: Ambulatory Visit | Attending: Family Medicine | Admitting: Family Medicine

## 2019-05-03 ENCOUNTER — Ambulatory Visit (INDEPENDENT_AMBULATORY_CARE_PROVIDER_SITE_OTHER): Payer: Medicare HMO | Admitting: Family Medicine

## 2019-05-03 ENCOUNTER — Telehealth: Payer: Self-pay

## 2019-05-03 VITALS — BP 134/72 | HR 82 | Temp 98.0°F | Resp 17 | Ht 69.0 in | Wt 287.0 lb

## 2019-05-03 DIAGNOSIS — I739 Peripheral vascular disease, unspecified: Secondary | ICD-10-CM | POA: Diagnosis not present

## 2019-05-03 DIAGNOSIS — S46812A Strain of other muscles, fascia and tendons at shoulder and upper arm level, left arm, initial encounter: Secondary | ICD-10-CM | POA: Diagnosis not present

## 2019-05-03 DIAGNOSIS — I10 Essential (primary) hypertension: Secondary | ICD-10-CM | POA: Diagnosis not present

## 2019-05-03 DIAGNOSIS — E785 Hyperlipidemia, unspecified: Secondary | ICD-10-CM | POA: Diagnosis not present

## 2019-05-03 DIAGNOSIS — E119 Type 2 diabetes mellitus without complications: Secondary | ICD-10-CM

## 2019-05-03 DIAGNOSIS — M542 Cervicalgia: Secondary | ICD-10-CM | POA: Diagnosis not present

## 2019-05-03 DIAGNOSIS — E871 Hypo-osmolality and hyponatremia: Secondary | ICD-10-CM

## 2019-05-03 LAB — PROTIME-INR
INR: 0.9 ratio (ref 0.8–1.0)
Prothrombin Time: 11.1 s (ref 9.6–13.1)

## 2019-05-03 LAB — HEMOGLOBIN A1C: Hgb A1c MFr Bld: 6.9 % — ABNORMAL HIGH (ref 4.6–6.5)

## 2019-05-03 LAB — COMPREHENSIVE METABOLIC PANEL
ALT: 34 U/L (ref 0–53)
AST: 26 U/L (ref 0–37)
Albumin: 4.6 g/dL (ref 3.5–5.2)
Alkaline Phosphatase: 51 U/L (ref 39–117)
BUN: 11 mg/dL (ref 6–23)
CO2: 28 mEq/L (ref 19–32)
Calcium: 9.6 mg/dL (ref 8.4–10.5)
Chloride: 87 mEq/L — ABNORMAL LOW (ref 96–112)
Creatinine, Ser: 0.82 mg/dL (ref 0.40–1.50)
GFR: 93.61 mL/min (ref >60.00)
Glucose, Bld: 122 mg/dL — ABNORMAL HIGH (ref 70–99)
Potassium: 4.6 mEq/L (ref 3.5–5.1)
Sodium: 123 mEq/L — ABNORMAL LOW (ref 135–145)
Total Bilirubin: 0.5 mg/dL (ref 0.2–1.2)
Total Protein: 7.1 g/dL (ref 6.0–8.3)

## 2019-05-03 LAB — CBC
HCT: 39.1 % (ref 39.0–52.0)
Hemoglobin: 13.3 g/dL (ref 13.0–17.0)
MCHC: 34 g/dL (ref 30.0–36.0)
MCV: 83 fl (ref 78.0–100.0)
Platelets: 225 10*3/uL (ref 150.0–400.0)
RBC: 4.71 Mil/uL (ref 4.22–5.81)
RDW: 14 % (ref 11.5–15.5)
WBC: 6.3 10*3/uL (ref 4.0–10.5)

## 2019-05-03 LAB — LIPID PANEL
Cholesterol: 127 mg/dL (ref 0–200)
HDL: 37.4 mg/dL — ABNORMAL LOW (ref >39.00)
LDL Cholesterol: 54 mg/dL (ref 0–99)
NonHDL: 89.73
Total CHOL/HDL Ratio: 3
Triglycerides: 179 mg/dL — ABNORMAL HIGH (ref 0.0–149.0)
VLDL: 35.8 mg/dL (ref 0.0–40.0)

## 2019-05-03 LAB — APTT: aPTT: 30.4 s (ref 23.4–32.7)

## 2019-05-03 MED ORDER — CYCLOBENZAPRINE HCL 10 MG PO TABS: 10.0000 mg | ORAL_TABLET | Freq: Two times a day (BID) | ORAL | 0 refills | Status: DC | PRN

## 2019-05-03 NOTE — Patient Instructions (Addendum)
It was good to see you again today Please do get an eye exam when you are able to do so I will be in touch with your labs asap Continue to do your best with exercise- perhaps try stopping for a rest prior to the onset of pain- this might help   Use the flexeril as needed for your neck and upper back pain- this med can make you drowsy, do not drive after taking it If you are not improving or have any worsening please let me know or otherwise seek care

## 2019-05-03 NOTE — Telephone Encounter (Signed)
PA initiated via Covermymeds; KEY: [id number]. PA approved.   PA Case: [id number], Status: Approved, Coverage Starts on: 10/23/2018, Coverage Ends on: 08/03/2019. Questions? Contact [phone]

## 2019-05-07 ENCOUNTER — Other Ambulatory Visit (INDEPENDENT_AMBULATORY_CARE_PROVIDER_SITE_OTHER): Payer: Medicare HMO

## 2019-05-07 ENCOUNTER — Other Ambulatory Visit: Payer: Self-pay

## 2019-05-07 ENCOUNTER — Encounter: Payer: Self-pay | Admitting: Family Medicine

## 2019-05-07 DIAGNOSIS — E871 Hypo-osmolality and hyponatremia: Secondary | ICD-10-CM

## 2019-05-07 LAB — BASIC METABOLIC PANEL WITH GFR
BUN: 10 mg/dL (ref 6–23)
CO2: 24 meq/L (ref 19–32)
Calcium: 9.3 mg/dL (ref 8.4–10.5)
Chloride: 95 meq/L — ABNORMAL LOW (ref 96–112)
Creatinine, Ser: 0.8 mg/dL (ref 0.40–1.50)
GFR: 96.31 mL/min
Glucose, Bld: 108 mg/dL — ABNORMAL HIGH (ref 70–99)
Potassium: 4.3 meq/L (ref 3.5–5.1)
Sodium: 130 meq/L — ABNORMAL LOW (ref 135–145)

## 2019-05-10 ENCOUNTER — Ambulatory Visit: Payer: Medicare HMO | Admitting: Family Medicine

## 2019-05-13 DIAGNOSIS — R69 Illness, unspecified: Secondary | ICD-10-CM | POA: Diagnosis not present

## 2019-05-22 ENCOUNTER — Encounter: Payer: Self-pay | Admitting: Family Medicine

## 2019-05-25 LAB — HEPATIC FUNCTION PANEL
ALT: 34 (ref 10–40)
AST: 22 (ref 14–40)
Alkaline Phosphatase: 51 (ref 25–125)
Bilirubin, Total: 0.4

## 2019-05-25 LAB — BASIC METABOLIC PANEL
BUN: 10 (ref 4–21)
Chloride: 99 (ref 99–108)
Creatinine: 0.8 (ref 0.6–1.3)
Potassium: 4.6 (ref 3.4–5.3)
Sodium: 135 — AB (ref 137–147)

## 2019-05-25 LAB — TSH: TSH: 0.69 (ref 0.41–5.90)

## 2019-05-25 LAB — CBC AND DIFFERENTIAL
HCT: 41 (ref 41–53)
Hemoglobin: 13.5 (ref 13.5–17.5)
Platelets: 208 (ref 150–399)
WBC: 5

## 2019-05-25 LAB — HEMOGLOBIN A1C: Hemoglobin A1C: 6.8

## 2019-05-30 ENCOUNTER — Other Ambulatory Visit: Payer: Self-pay | Admitting: Family Medicine

## 2019-05-30 DIAGNOSIS — I2510 Atherosclerotic heart disease of native coronary artery without angina pectoris: Secondary | ICD-10-CM | POA: Diagnosis not present

## 2019-05-30 DIAGNOSIS — E785 Hyperlipidemia, unspecified: Secondary | ICD-10-CM | POA: Diagnosis not present

## 2019-05-30 DIAGNOSIS — K219 Gastro-esophageal reflux disease without esophagitis: Secondary | ICD-10-CM

## 2019-05-30 DIAGNOSIS — R69 Illness, unspecified: Secondary | ICD-10-CM | POA: Diagnosis not present

## 2019-05-30 DIAGNOSIS — E1151 Type 2 diabetes mellitus with diabetic peripheral angiopathy without gangrene: Secondary | ICD-10-CM | POA: Diagnosis not present

## 2019-05-30 DIAGNOSIS — Z7984 Long term (current) use of oral hypoglycemic drugs: Secondary | ICD-10-CM | POA: Diagnosis not present

## 2019-05-30 DIAGNOSIS — Z809 Family history of malignant neoplasm, unspecified: Secondary | ICD-10-CM | POA: Diagnosis not present

## 2019-05-30 DIAGNOSIS — N529 Male erectile dysfunction, unspecified: Secondary | ICD-10-CM | POA: Diagnosis not present

## 2019-05-30 DIAGNOSIS — I10 Essential (primary) hypertension: Secondary | ICD-10-CM | POA: Diagnosis not present

## 2019-06-01 ENCOUNTER — Telehealth: Payer: Self-pay | Admitting: Family Medicine

## 2019-06-01 NOTE — Telephone Encounter (Signed)
Rec'd records from [place] forwarded 9 pages to Dr. Lamar Blinks C

## 2019-06-06 ENCOUNTER — Encounter: Payer: Self-pay | Admitting: Family Medicine

## 2019-06-10 ENCOUNTER — Other Ambulatory Visit: Payer: Self-pay | Admitting: Family Medicine

## 2019-06-10 ENCOUNTER — Other Ambulatory Visit: Payer: Self-pay | Admitting: Medical

## 2019-06-10 DIAGNOSIS — I10 Essential (primary) hypertension: Secondary | ICD-10-CM

## 2019-06-12 DIAGNOSIS — N138 Other obstructive and reflux uropathy: Secondary | ICD-10-CM | POA: Diagnosis not present

## 2019-06-12 DIAGNOSIS — N401 Enlarged prostate with lower urinary tract symptoms: Secondary | ICD-10-CM | POA: Diagnosis not present

## 2019-06-14 DIAGNOSIS — R69 Illness, unspecified: Secondary | ICD-10-CM | POA: Diagnosis not present

## 2019-06-18 DIAGNOSIS — M5030 Other cervical disc degeneration, unspecified cervical region: Secondary | ICD-10-CM | POA: Diagnosis not present

## 2019-06-18 DIAGNOSIS — M542 Cervicalgia: Secondary | ICD-10-CM | POA: Diagnosis not present

## 2019-06-18 DIAGNOSIS — M75102 Unspecified rotator cuff tear or rupture of left shoulder, not specified as traumatic: Secondary | ICD-10-CM | POA: Diagnosis not present

## 2019-06-18 DIAGNOSIS — M47812 Spondylosis without myelopathy or radiculopathy, cervical region: Secondary | ICD-10-CM | POA: Diagnosis not present

## 2019-06-19 DIAGNOSIS — R69 Illness, unspecified: Secondary | ICD-10-CM | POA: Diagnosis not present

## 2019-07-16 DIAGNOSIS — H52223 Regular astigmatism, bilateral: Secondary | ICD-10-CM | POA: Diagnosis not present

## 2019-07-16 DIAGNOSIS — H5203 Hypermetropia, bilateral: Secondary | ICD-10-CM | POA: Diagnosis not present

## 2019-07-16 DIAGNOSIS — H524 Presbyopia: Secondary | ICD-10-CM | POA: Diagnosis not present

## 2019-07-19 ENCOUNTER — Other Ambulatory Visit: Payer: Self-pay | Admitting: Family Medicine

## 2019-07-20 DIAGNOSIS — R69 Illness, unspecified: Secondary | ICD-10-CM | POA: Diagnosis not present

## 2019-08-07 DIAGNOSIS — I739 Peripheral vascular disease, unspecified: Secondary | ICD-10-CM | POA: Diagnosis not present

## 2019-08-11 ENCOUNTER — Other Ambulatory Visit: Payer: Self-pay | Admitting: Family Medicine

## 2019-08-11 DIAGNOSIS — I10 Essential (primary) hypertension: Secondary | ICD-10-CM

## 2019-08-16 DIAGNOSIS — R69 Illness, unspecified: Secondary | ICD-10-CM | POA: Diagnosis not present

## 2019-08-18 ENCOUNTER — Encounter: Payer: Self-pay | Admitting: Family Medicine

## 2019-08-18 ENCOUNTER — Other Ambulatory Visit: Payer: Self-pay | Admitting: Family Medicine

## 2019-08-18 DIAGNOSIS — E781 Pure hyperglyceridemia: Secondary | ICD-10-CM

## 2019-08-18 DIAGNOSIS — E785 Hyperlipidemia, unspecified: Secondary | ICD-10-CM

## 2019-08-18 DIAGNOSIS — I10 Essential (primary) hypertension: Secondary | ICD-10-CM

## 2019-08-20 NOTE — Progress Notes (Signed)
HPI: FU CAD.Patient had PCI in [place] previously. No records available. Patient is followed by vascular surgery for peripheral vascular disease. Angiogram March 2018 showed no renal artery stenosis. 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 There was reconstitution of the distal right common iliac artery; it was felt medical therapy indicated.  Nuclear study April 2018 showed ejection fraction 54% with no ischemia or infarction.  ABIs 1/20 showed moderate disease on the right and normal on the left.  Patient apparently had attempt at iliac recanalization in [facility] which was unsuccessful March 2020. Since last seen, he denies dyspnea, CP, palpitations or syncope. He continues with claudication after walking 2 blocks in RLE.  Current Outpatient Medications  Medication Sig Dispense Refill  . amLODipine (NORVASC) 10 MG tablet TAKE 1 TABLET BY MOUTH EVERY DAY 90 tablet 3  . aspirin 81 MG tablet Take 81 mg by mouth daily.    Marland Kitchen atorvastatin (LIPITOR) 80 MG tablet TAKE 1 TABLET BY MOUTH EVERY DAY 90 tablet 3  . Blood Glucose Monitoring Suppl (FREESTYLE LITE) DEVI CHECK BLOOD SUGAR ONCE DAILY  0  . Blood Glucose Monitoring Suppl (FREESTYLE LITE) DEVI CHECK BLOOD SUGAR ONCE DAILY 1 each 0  . busPIRone (BUSPAR) 10 MG tablet TAKE ONE TABLET BY MOUTH TWICE A DAY 180 tablet 3  . chlorthalidone (HYGROTON) 25 MG tablet TAKE 1 TABLET BY MOUTH EVERY DAY 90 tablet 1  . Cholecalciferol (VITAMIN D3) 2000 units TABS Take 2,000 Units by mouth daily.    . Coenzyme Q10 300 MG CAPS Take 300 mg by mouth daily.     . colchicine 0.6 MG tablet Take 2 tabs by mouth now and then repeat in 1 hour.  May repeat tomorrow if not improved. 6 tablet 0  . Cyanocobalamin (VITAMIN B-12 PO) Take 2,500 mg by mouth daily.    . cyclobenzaprine (FLEXERIL) 10 MG tablet Take 1 tablet (10 mg total) by mouth 2 (two) times daily as needed for muscle spasms. 30 tablet 0   . glipiZIDE (GLUCOTROL XL) 2.5 MG 24 hr tablet TAKE 1 TABLET (2.5 MG TOTAL) BY MOUTH DAILY WITH BREAKFAST. 90 tablet 1  . glucose blood (FREESTYLE LITE) test strip Check blood sugar 1-3x daily 100 each 12  . glucose blood (FREESTYLE LITE) test strip CHECK BLOOD SUGAR ONCE DAILY 100 each 2  . hydrochlorothiazide (MICROZIDE) 12.5 MG capsule Take 1 capsule (12.5 mg total) by mouth daily. 90 capsule 3  . Lancets (FREESTYLE) lancets Check blood sugar once daily 100 each 12  . lisinopril (PRINIVIL,ZESTRIL) 40 MG tablet Take 1 tablet (40 mg total) by mouth daily. 90 tablet 3  . lisinopril (ZESTRIL) 10 MG tablet TAKE 1 TABLET BY MOUTH EVERY DAY 90 tablet 1  . meloxicam (MOBIC) 15 MG tablet Take 15 mg by mouth daily.    . metFORMIN (GLUCOPHAGE) 1000 MG tablet TAKE 1 TABLET (1,000 MG TOTAL) BY MOUTH 2 (TWO) TIMES DAILY WITH A MEAL. 180 tablet 3  . metoprolol succinate (TOPROL-XL) 100 MG 24 hr tablet TAKE 1 TABLET (100 MG TOTAL) BY MOUTH DAILY. TAKE WITH OR IMMEDIATELY FOLLOWING A MEAL. 90 tablet 1  . MULTIPLE VITAMIN PO Take 1 tablet by mouth daily.     . niacin 500 MG tablet Take 500 mg by mouth every morning.     . Omega-3 Fatty Acids (EQL OMEGA 3 FISH OIL) 1000 MG CAPS Take 1 capsule by mouth 2 (  two) times daily.    [name] VERIO test strip USE TO TEST BLOOD SUGAR 1-3 TIMES DAILY 100 strip 3  . pantoprazole (PROTONIX) 40 MG tablet TAKE 1 TABLET BY MOUTH EVERY DAY 90 tablet 1  . prednisoLONE acetate (PRED FORTE) 1 % ophthalmic suspension INSTILL 1 DROP INTO LEFT EYE TWICE A DAY    . predniSONE (DELTASONE) 20 MG tablet Take 1 daily for 5-7 days for nasal congestion 7 tablet 0  . Probiotic Product (PROBIOTIC ADVANCED PO) Take by mouth every evening. [employer]co brand takes 2 tabs    . triamcinolone (NASACORT) 55 MCG/ACT AERO nasal inhaler Place 2 sprays into the nose daily. 1 Inhaler 12  . UNABLE TO FIND Take 500 mcg by mouth daily. Med Name: Vitamin K2    . UNABLE TO FIND Med Name: Natural MK-7-100 MCG  WITH MK-4-100MG      No current facility-administered medications for this visit.      Past Medical History:  Diagnosis Date  . CAD (coronary artery disease)   . Cancer ([place])    basal cell excision upper right ear 7 yrs ago  . Complication of anesthesia    bad pain with anesthesia induction with colonscopy 6 yrs ago  . Diabetes mellitus without complication ([place])    type 2  . GERD (gastroesophageal reflux disease)   . Hyperlipidemia   . Hypertension   . Peripheral vascular disease ([place])    partial clogged artery right leg  . Sleep apnea    yrs ago sleep study no cpap used    Past Surgical History:  Procedure Laterality Date  . ABDOMINAL AORTOGRAM W/LOWER EXTREMITY N/A 12/27/2016   Procedure: Abdominal Aortogram w/Lower Extremity;  Surgeon: Angelia Mould, MD;  Location: [facility];  Service: Cardiovascular;  Laterality: N/A;  . CARDIAC SURGERY    . CORONARY ANGIOPLASTY WITH STENT PLACEMENT  11/2004   in [place]  . MENISCUS REPAIR Right   . NASAL SINUS SURGERY  1976  . TONSILLECTOMY      Social History   Socioeconomic History  . Marital status: Married    Spouse name: Not on file  . Number of children: 2  . Years of education: Not on file  . Highest education level: Not on file  Occupational History  . Not on file  Social Needs  . Financial resource strain: Not on file  . Food insecurity    Worry: Not on file    Inability: Not on file  . Transportation needs    Medical: Not on file    Non-medical: Not on file  Tobacco Use  . Smoking status: Former Smoker    Packs/day: 1.50    Years: 26.00    Pack years: 39.00    Types: Cigarettes    Quit date: 1994    Years since quitting: 26.8  . Smokeless tobacco: Former Network engineer and Sexual Activity  . Alcohol use: Yes    Alcohol/week: 0.0 standard drinks    Comment: 3 ounces on monday  . Drug use: No  . Sexual activity: Yes  Lifestyle  . Physical activity    Days per week: Not on file     Minutes per session: Not on file  . Stress: Not on file  Relationships  . Social Herbalist on phone: Not on file    Gets together: Not on file    Attends religious service: Not on file    Active member of club or organization:  Not on file    Attends meetings of clubs or organizations: Not on file    Relationship status: Not on file  . Intimate partner violence    Fear of current or ex partner: Not on file    Emotionally abused: Not on file    Physically abused: Not on file    Forced sexual activity: Not on file  Other Topics Concern  . Not on file  Social History Narrative  . Not on file    Family History  Problem Relation Age of Onset  . Alcohol abuse Brother   . Cancer Brother   . Diabetes Brother   . Hyperlipidemia Brother   . Hypertension Brother   . Diabetes Mother   . Hypertension Mother   . Hyperlipidemia Mother   . Heart attack Mother   . Hyperlipidemia Father   . Hypertension Father     ROS: no fevers or chills, productive cough, hemoptysis, dysphasia, odynophagia, melena, hematochezia, dysuria, hematuria, rash, seizure activity, orthopnea, PND, pedal edema, claudication. Remaining systems are negative.  Physical Exam: Well-developed well-nourished in no acute distress.  Skin is warm and dry.  HEENT is normal.  Neck is supple.  Chest is clear to auscultation with normal expansion.  Cardiovascular exam is regular rate and rhythm.  Abdominal exam nontender or distended. No masses palpated. Extremities show no edema. neuro grossly intact  ECG-sinus rhythm at a rate of 97, no ST changes.  Personally reviewed  A/P  1 coronary artery disease-patient denies chest pain.  Plan to continue medical therapy with aspirin and statin.  2 hypertension-blood pressure elevated; DC chorthalidione and continue HCTZ; change lisinopril to 40 mg daily; increase toprol to 150 mg daily; follow BP and adjust meds as needed.  3 hyperlipidemia-continue statin.  4  peripheral vascular disease-patient is followed by vascular surgery.  Recent attempt at recanalization of iliac unsuccessful based on outside records.  Continue medical therapy. I discussed importance of walking.  5 morbid obesity-we discussed the importance of diet, exercise and weight loss.  Kirk Ruths, MD

## 2019-08-22 ENCOUNTER — Ambulatory Visit (INDEPENDENT_AMBULATORY_CARE_PROVIDER_SITE_OTHER): Payer: Medicare HMO | Admitting: Cardiology

## 2019-08-22 ENCOUNTER — Encounter: Payer: Self-pay | Admitting: Cardiology

## 2019-08-22 ENCOUNTER — Other Ambulatory Visit: Payer: Self-pay

## 2019-08-22 VITALS — BP 152/98 | HR 97 | Ht 69.0 in | Wt 293.1 lb

## 2019-08-22 DIAGNOSIS — E7800 Pure hypercholesterolemia, unspecified: Secondary | ICD-10-CM

## 2019-08-22 DIAGNOSIS — I10 Essential (primary) hypertension: Secondary | ICD-10-CM

## 2019-08-22 DIAGNOSIS — I739 Peripheral vascular disease, unspecified: Secondary | ICD-10-CM

## 2019-08-22 DIAGNOSIS — I2510 Atherosclerotic heart disease of native coronary artery without angina pectoris: Secondary | ICD-10-CM | POA: Diagnosis not present

## 2019-08-22 MED ORDER — METOPROLOL SUCCINATE ER 50 MG PO TB24: 50.0000 mg | ORAL_TABLET | Freq: Every day | ORAL | 3 refills | Status: DC

## 2019-08-22 NOTE — Patient Instructions (Signed)
Medication Instructions:  STOP CHLORTHALIDONE   CONTINUE HCTZ  STOP LISINOPRIL 10 MG   CONTINUE LISINOPRIL 40 MG ONCE DAILY  INCREASE METOPROLOL TO 150 MG ONCE DAILY= 1 OF THE 100 MG TABLETS AND 1 OF THE 50 MG TABLETS ONCE DAILY  *If you need a refill on your cardiac medications before your next appointment, please call your pharmacy*  Lab Work: If you have labs (blood work) drawn today and your tests are completely normal, you will receive your results only by: Marland Kitchen MyChart Message (if you have MyChart) OR . A paper copy in the mail If you have any lab test that is abnormal or we need to change your treatment, we will call you to review the results.  Follow-Up: At [facility], you and your health needs are our priority.  As part of our continuing mission to provide you with exceptional heart care, we have created designated Provider Care Teams.  These Care Teams include your primary Cardiologist (physician) and Advanced Practice Providers (APPs -  Physician Assistants and Nurse Practitioners) who all work together to provide you with the care you need, when you need it.  Your next appointment:   12 months  The format for your next appointment:   In Person  Provider:   Kirk Ruths MD

## 2019-09-04 ENCOUNTER — Other Ambulatory Visit: Payer: Self-pay

## 2019-09-04 NOTE — Progress Notes (Addendum)
[place] at [facility] [address], [place], [place] [address] [phone] [id number]  Date:  09/05/2019   Name:  David Singh   DOB:  [birth year]   MRN:  [id number]  PCP:  Darreld Mclean, MD    Chief Complaint: Medication Management   History of Present Illness:  Hason Saeteurn is a 68 y.o. very pleasant male patient who presents with the following:  Here today for follow-up visit History of dyslipidemia, diabetes, CAD status post stent in 2006, hypertension, PAD, gout Last seen by myself in July  Vascular surgeon-Dr. Radene Knee with [facility] Cardiology-Dr. Stanford Breed Urology-Dr. Felipa Eth  His PAD has been a big problem for him, he really limits his exercise tolerance.  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  We wonder if a bike might be easier for him than walking He does check home BP- running about 140/80, improved since [place] changed his medications   He saw Dr. Stanford Breed about 2 weeks ago: 1 coronary artery disease-patient denies chest pain.  Plan to continue medical therapy with aspirin and statin. 2 hypertension-blood pressure elevated; DC chorthalidione and continue HCTZ; change lisinopril to 40 mg daily; increase toprol to 150 mg daily; follow BP and adjust meds as needed. 3 hyperlipidemia-continue statin. 4 peripheral vascular disease-patient is followed by vascular surgery.  Recent attempt at recanalization of iliac unsuccessful based on outside records.  Continue medical therapy. I discussed importance of walking. 5 morbid obesity-we discussed the importance of diet, exercise and weight loss.  Went over note from his urologist, Dr. Felipa Eth.  They had planned a TURP operation sometime this fall for  obstructive symptoms- however this has been delayed.  He wants to hold off on this for now  Reviewed note from Dr Radene Knee, 10/13- Assessment and Plan:  [phone] with hx of HTN, HLD, DM2, CAD s/p stenting, and obesity who is being seen in follow-up for claudication s/p prior unsuccessful iliac recanalization.  We reviewed the images from the duplex which demonstrates an ABI of R 0.76 and L 0.99. We discussed the nature of the disease process. With nonlifestyle limiting claudication, best medical management includes ASA, Statin, and walking regimen. We will plan repeat ABI in 12 months. Pletal has not previously helped. We will follow-up in 12 month - PAD with claudication - Cont ASA and statin. F/u in 12 months with ABIs.  - HTN - Controlled, continue medication - HLD - Controlled, continue medication  Flu vaccine- done per [facility]  Eye exam- he needs to go back for dilated exam He is using hearing aids- got about 5 weeks ago  Lab Results  Component Value Date   HGBA1C 6.9 (H) 05/03/2019   Can check A1c today, BMP  He has noted some depression recently - he has felt this way for a couple of months  He is taking buspar- his anxiety has been under ok control We wonder if increasing his buspar might help and he is willing to give this a try- would prefer to take this BID for convenience He is having some stress in his marital relationship- he would like to do counseling but hs wife is not really interested at this time Patient Active Problem List  Diagnosis Date Noted  . Chronic gout 03/09/2016  . Hypertriglyceridemia 01/16/2015  . Type 2 diabetes mellitus ([place]) 01/06/2015  . CAD in native artery 01/06/2015  . History of coronary artery stent placement 01/06/2015  . PAD (peripheral artery disease) ([place]) 01/06/2015  . Hyperlipidemia 01/06/2015  . Essential hypertension 01/06/2015    Past Medical History:  Diagnosis Date  . CAD (coronary artery disease)   . Cancer ([place])    basal cell excision  upper right ear 7 yrs ago  . Complication of anesthesia    bad pain with anesthesia induction with colonscopy 6 yrs ago  . Diabetes mellitus without complication ([place])    type 2  . GERD (gastroesophageal reflux disease)   . Hyperlipidemia   . Hypertension   . Peripheral vascular disease ([place])    partial clogged artery right leg  . Sleep apnea    yrs ago sleep study no cpap used    Past Surgical History:  Procedure Laterality Date  . ABDOMINAL AORTOGRAM W/LOWER EXTREMITY N/A 12/27/2016   Procedure: Abdominal Aortogram w/Lower Extremity;  Surgeon: Angelia Mould, MD;  Location: [facility];  Service: Cardiovascular;  Laterality: N/A;  . CARDIAC SURGERY    . CORONARY ANGIOPLASTY WITH STENT PLACEMENT  11/2004   in [place]  . MENISCUS REPAIR Right   . NASAL SINUS SURGERY  1976  . TONSILLECTOMY      Social History   Tobacco Use  . Smoking status: Former Smoker    Packs/day: 1.50    Years: 26.00    Pack years: 39.00    Types: Cigarettes    Quit date: 1994    Years since quitting: 26.8  . Smokeless tobacco: Former Network engineer Use Topics  . Alcohol use: Yes    Alcohol/week: 0.0 standard drinks    Comment: 3 ounces on monday  . Drug use: No    Family History  Problem Relation Age of Onset  . Alcohol abuse Brother   . Cancer Brother   . Diabetes Brother   . Hyperlipidemia Brother   . Hypertension Brother   . Diabetes Mother   . Hypertension Mother   . Hyperlipidemia Mother   . Heart attack Mother   . Hyperlipidemia Father   . Hypertension Father     Allergies  Allergen Reactions  . Dexilant [Dexlansoprazole]     Pressure in stomach and chest    Medication list has been reviewed and updated.  Current Outpatient Medications on File Prior to Visit  Medication Sig Dispense Refill  . amLODipine (NORVASC) 10 MG tablet TAKE 1 TABLET BY MOUTH EVERY DAY 90 tablet 3  . aspirin 81 MG tablet Take 81 mg by mouth daily.    Marland Kitchen atorvastatin (LIPITOR) 80 MG  tablet TAKE 1 TABLET BY MOUTH EVERY DAY 90 tablet 3  . Blood Glucose Monitoring Suppl (FREESTYLE LITE) DEVI CHECK BLOOD SUGAR ONCE DAILY  0  . Blood Glucose Monitoring Suppl (FREESTYLE LITE) DEVI CHECK BLOOD SUGAR ONCE DAILY 1 each 0  . busPIRone (BUSPAR) 10 MG tablet TAKE ONE TABLET BY MOUTH TWICE A DAY 180 tablet 3  . Cholecalciferol (VITAMIN D3) 2000 units TABS Take 2,000 Units by mouth daily.    . Coenzyme Q10 300 MG CAPS Take 300 mg by mouth daily.     . colchicine 0.6 MG tablet Take 2 tabs by mouth now and then repeat in 1 hour.  May repeat tomorrow if not improved. 6 tablet 0  . Cyanocobalamin (VITAMIN B-12  PO) Take 2,500 mg by mouth daily.    . cyclobenzaprine (FLEXERIL) 10 MG tablet Take 1 tablet (10 mg total) by mouth 2 (two) times daily as needed for muscle spasms. 30 tablet 0  . glipiZIDE (GLUCOTROL XL) 2.5 MG 24 hr tablet TAKE 1 TABLET (2.5 MG TOTAL) BY MOUTH DAILY WITH BREAKFAST. 90 tablet 1  . glucose blood (FREESTYLE LITE) test strip Check blood sugar 1-3x daily 100 each 12  . glucose blood (FREESTYLE LITE) test strip CHECK BLOOD SUGAR ONCE DAILY 100 each 2  . hydrochlorothiazide (MICROZIDE) 12.5 MG capsule Take 1 capsule (12.5 mg total) by mouth daily. 90 capsule 3  . Lancets (FREESTYLE) lancets Check blood sugar once daily 100 each 12  . lisinopril (PRINIVIL,ZESTRIL) 40 MG tablet Take 1 tablet (40 mg total) by mouth daily. 90 tablet 3  . meloxicam (MOBIC) 15 MG tablet Take 15 mg by mouth daily.    . metFORMIN (GLUCOPHAGE) 1000 MG tablet TAKE 1 TABLET (1,000 MG TOTAL) BY MOUTH 2 (TWO) TIMES DAILY WITH A MEAL. 180 tablet 3  . metoprolol succinate (TOPROL-XL) 100 MG 24 hr tablet TAKE 1 TABLET (100 MG TOTAL) BY MOUTH DAILY. TAKE WITH OR IMMEDIATELY FOLLOWING A MEAL. 90 tablet 1  . metoprolol succinate (TOPROL-XL) 50 MG 24 hr tablet Take 1 tablet (50 mg total) by mouth daily. Take with or immediately following a meal. 90 tablet 3  . MULTIPLE VITAMIN PO Take 1 tablet by mouth daily.      . niacin 500 MG tablet Take 500 mg by mouth every morning.     . Omega-3 Fatty Acids (EQL OMEGA 3 FISH OIL) 1000 MG CAPS Take 1 capsule by mouth 2 (two) times daily.    [name] VERIO test strip USE TO TEST BLOOD SUGAR 1-3 TIMES DAILY 100 strip 3  . pantoprazole (PROTONIX) 40 MG tablet TAKE 1 TABLET BY MOUTH EVERY DAY 90 tablet 1  . prednisoLONE acetate (PRED FORTE) 1 % ophthalmic suspension INSTILL 1 DROP INTO LEFT EYE TWICE A DAY    . predniSONE (DELTASONE) 20 MG tablet Take 1 daily for 5-7 days for nasal congestion 7 tablet 0  . Probiotic Product (PROBIOTIC ADVANCED PO) Take by mouth every evening. [employer] brand takes 2 tabs    . triamcinolone (NASACORT) 55 MCG/ACT AERO nasal inhaler Place 2 sprays into the nose daily. 1 Inhaler 12  . UNABLE TO FIND Take 500 mcg by mouth daily. Med Name: Vitamin K2    . UNABLE TO FIND Med Name: Natural MK-7-100 MCG WITH MK-4-100MG      No current facility-administered medications on file prior to visit.     Review of Systems:  As per HPI- otherwise negative. No fever or chills No CP or SOB Stable claudication sx   Physical Examination: Vitals:   09/05/19 0843  BP: 140/80  Pulse: 69  Resp: 18  Temp: (!) 97.5 F (36.4 C)  SpO2: 97%   Vitals:   09/05/19 0843  Weight: 295 lb (133.8 kg)  Height: 5\' 9"  (1.753 m)   Body mass index is 43.56 kg/m. Ideal Body Weight: Weight in (lb) to have BMI = 25: 168.9  GEN: WDWN, NAD, Non-toxic, A & O x 3, obese, looks well  HEENT: Atraumatic, Normocephalic. Neck supple. No masses, No LAD. Ears and Nose: No external deformity. CV: RRR, No M/G/R. No JVD. No thrill. No extra heart sounds. PULM: CTA B, no wheezes, crackles, rhonchi. No retractions. No resp. distress. No accessory muscle use. ABD: S,  NT, ND EXTR: No c/c/e NEURO Normal gait.  PSYCH: Normally interactive. Conversant. Not depressed or anxious appearing.  Calm demeanor.   BP Readings from Last 3 Encounters:  09/05/19 140/80  08/22/19 (!)  152/98  05/03/19 134/72    Assessment and Plan: Essential hypertension  Dyslipidemia  Type 2 diabetes mellitus without complication, without long-term current use of insulin (HCC) - Plan: Basic metabolic panel, Hemoglobin A1c  GAD (generalized anxiety disorder) - Plan: busPIRone (BUSPAR) 15 MG tablet, DISCONTINUED: busPIRone (BUSPAR) 15 MG tablet  Following up today BP is under better control with current regimen Check BMP Check A1c today Increase buspar to 15 BID Discussed counseling- he would like to give this a try. Can do individual counseling if his wife is not willing to participate right now   Signed Lamar Blinks, MD  Received his labs, message to pt  Results for orders placed or performed in visit on [id number]  Basic metabolic panel  Result Value Ref Range   Sodium 132 (L) 135 - 145 mEq/L   Potassium 4.5 3.5 - 5.1 mEq/L   Chloride 95 (L) 96 - 112 mEq/L   CO2 28 19 - 32 mEq/L   Glucose, Bld 133 (H) 70 - 99 mg/dL   BUN 10 6 - 23 mg/dL   Creatinine, Ser 0.84 0.40 - 1.50 mg/dL   GFR 90.95 >60.00 mL/min   Calcium 9.5 8.4 - 10.5 mg/dL  Hemoglobin A1c  Result Value Ref Range   Hgb A1c MFr Bld 6.9 (H) 4.6 - 6.5 %

## 2019-09-04 NOTE — Patient Instructions (Addendum)
Great to see you again today! I will be in touch with your labs Your BP looks better  Best of luck with getting back to the gym I do think counseling might be a good idea- even if your wife is not currently interested in couples counseling, individual counseling can still be helpful  Let's try increasing your Buspar to 15 mg twice a day.  I sent in a new rx for you Please increase just one of your daily doses to 15 for about 3 days, then you can increase the 2nd dose as well  Let's plan to visit in about 6 months for your labs   Please send me a mychart message with an update about your mood in 4-6 weeks- Sooner if worse.

## 2019-09-05 ENCOUNTER — Encounter: Payer: Self-pay | Admitting: Family Medicine

## 2019-09-05 ENCOUNTER — Ambulatory Visit (INDEPENDENT_AMBULATORY_CARE_PROVIDER_SITE_OTHER): Payer: Medicare HMO | Admitting: Family Medicine

## 2019-09-05 VITALS — BP 140/80 | HR 69 | Temp 97.5°F | Resp 18 | Ht 69.0 in | Wt 295.0 lb

## 2019-09-05 DIAGNOSIS — E119 Type 2 diabetes mellitus without complications: Secondary | ICD-10-CM

## 2019-09-05 DIAGNOSIS — R69 Illness, unspecified: Secondary | ICD-10-CM | POA: Diagnosis not present

## 2019-09-05 DIAGNOSIS — I10 Essential (primary) hypertension: Secondary | ICD-10-CM | POA: Diagnosis not present

## 2019-09-05 DIAGNOSIS — E785 Hyperlipidemia, unspecified: Secondary | ICD-10-CM | POA: Diagnosis not present

## 2019-09-05 DIAGNOSIS — F411 Generalized anxiety disorder: Secondary | ICD-10-CM

## 2019-09-05 LAB — BASIC METABOLIC PANEL
BUN: 10 mg/dL (ref 6–23)
CO2: 28 mEq/L (ref 19–32)
Calcium: 9.5 mg/dL (ref 8.4–10.5)
Chloride: 95 mEq/L — ABNORMAL LOW (ref 96–112)
Creatinine, Ser: 0.84 mg/dL (ref 0.40–1.50)
GFR: 90.95 mL/min (ref >60.00)
Glucose, Bld: 133 mg/dL — ABNORMAL HIGH (ref 70–99)
Potassium: 4.5 mEq/L (ref 3.5–5.1)
Sodium: 132 mEq/L — ABNORMAL LOW (ref 135–145)

## 2019-09-05 LAB — HEMOGLOBIN A1C: Hgb A1c MFr Bld: 6.9 % — ABNORMAL HIGH (ref 4.6–6.5)

## 2019-09-05 MED ORDER — BUSPIRONE HCL 15 MG PO TABS: 15.0000 mg | ORAL_TABLET | Freq: Two times a day (BID) | ORAL | 3 refills | Status: DC

## 2019-09-06 ENCOUNTER — Encounter: Payer: Self-pay | Admitting: Family Medicine

## 2019-09-10 MED ORDER — CLONIDINE HCL 0.1 MG PO TABS: 0.1000 mg | ORAL_TABLET | Freq: Two times a day (BID) | ORAL | 11 refills | Status: DC

## 2019-09-10 NOTE — Telephone Encounter (Signed)
Add clonidine 0.1 mg BID and follow BP  Kirk Ruths

## 2019-09-11 ENCOUNTER — Other Ambulatory Visit: Payer: Self-pay | Admitting: Cardiology

## 2019-09-11 DIAGNOSIS — I2510 Atherosclerotic heart disease of native coronary artery without angina pectoris: Secondary | ICD-10-CM

## 2019-09-14 DIAGNOSIS — R69 Illness, unspecified: Secondary | ICD-10-CM | POA: Diagnosis not present

## 2019-10-14 DIAGNOSIS — R69 Illness, unspecified: Secondary | ICD-10-CM | POA: Diagnosis not present

## 2019-10-19 ENCOUNTER — Other Ambulatory Visit: Payer: Self-pay | Admitting: Family Medicine

## 2019-10-19 DIAGNOSIS — K219 Gastro-esophageal reflux disease without esophagitis: Secondary | ICD-10-CM

## 2019-10-21 IMAGING — DX DG CERVICAL SPINE COMPLETE 4+V
6 series · 6 of 6 positions shown · non-contrast
Comparison: None.

CLINICAL DATA: Bilateral neck pain for the past 4 days after rough
airplane flight.

EXAM:
CERVICAL SPINE - COMPLETE 4+ VIEW

[c-spine lat]
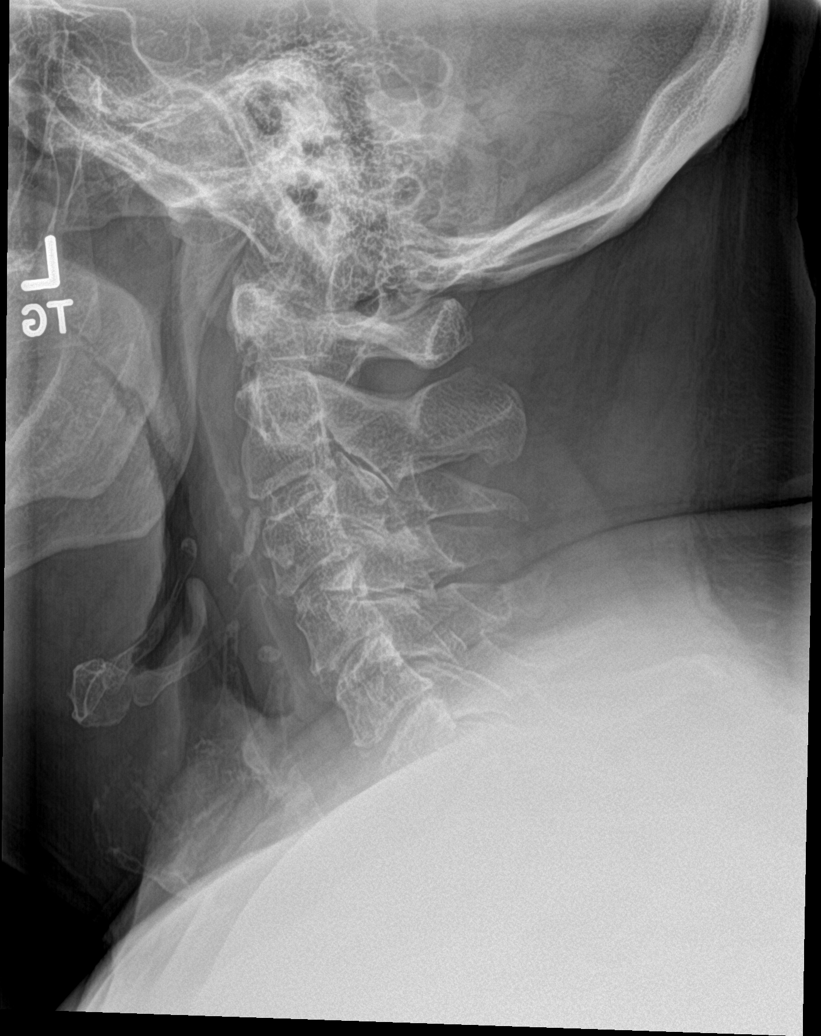

[c-spine obl (1 of 2)]
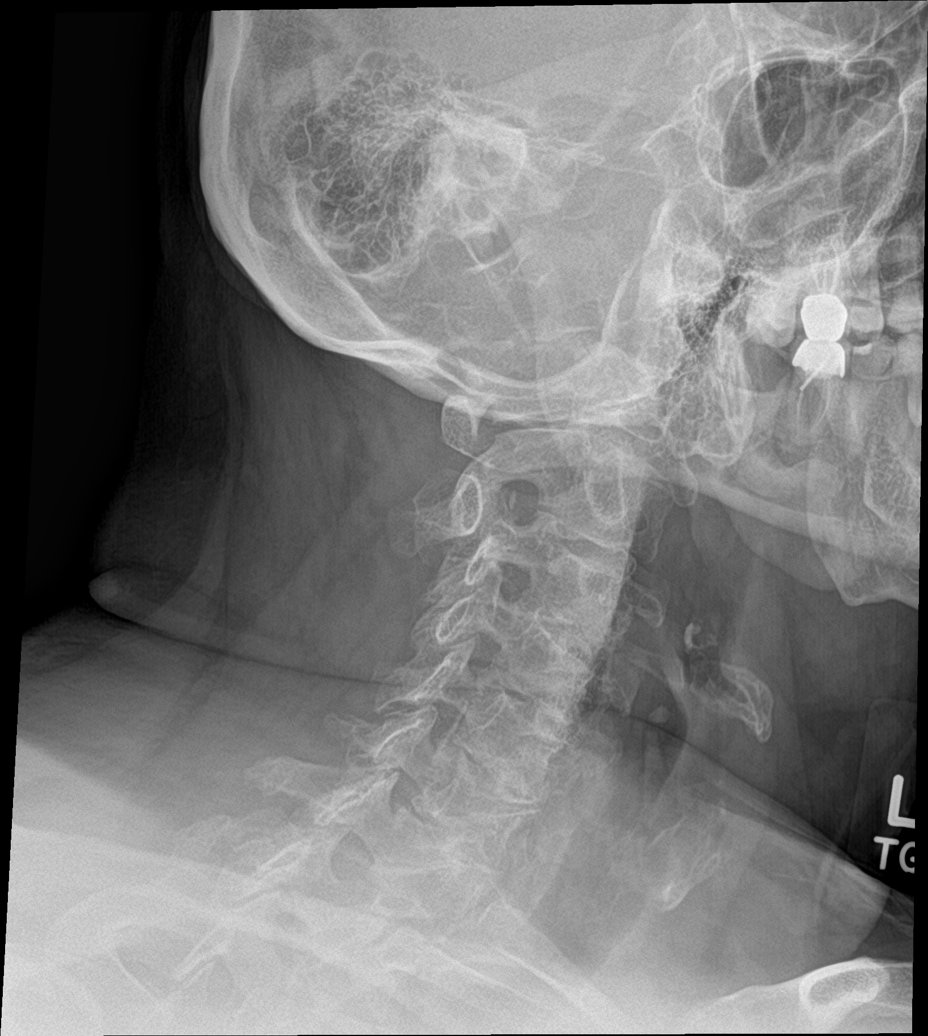

[c-spine obl (2 of 2)]
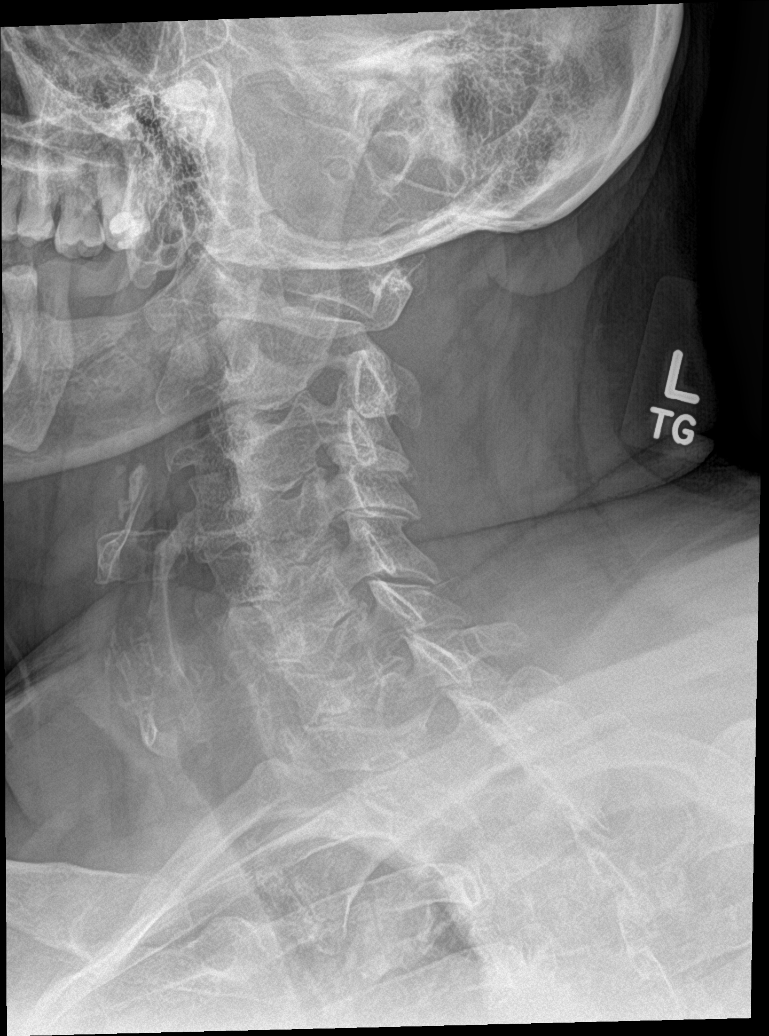

[c-spine ap]
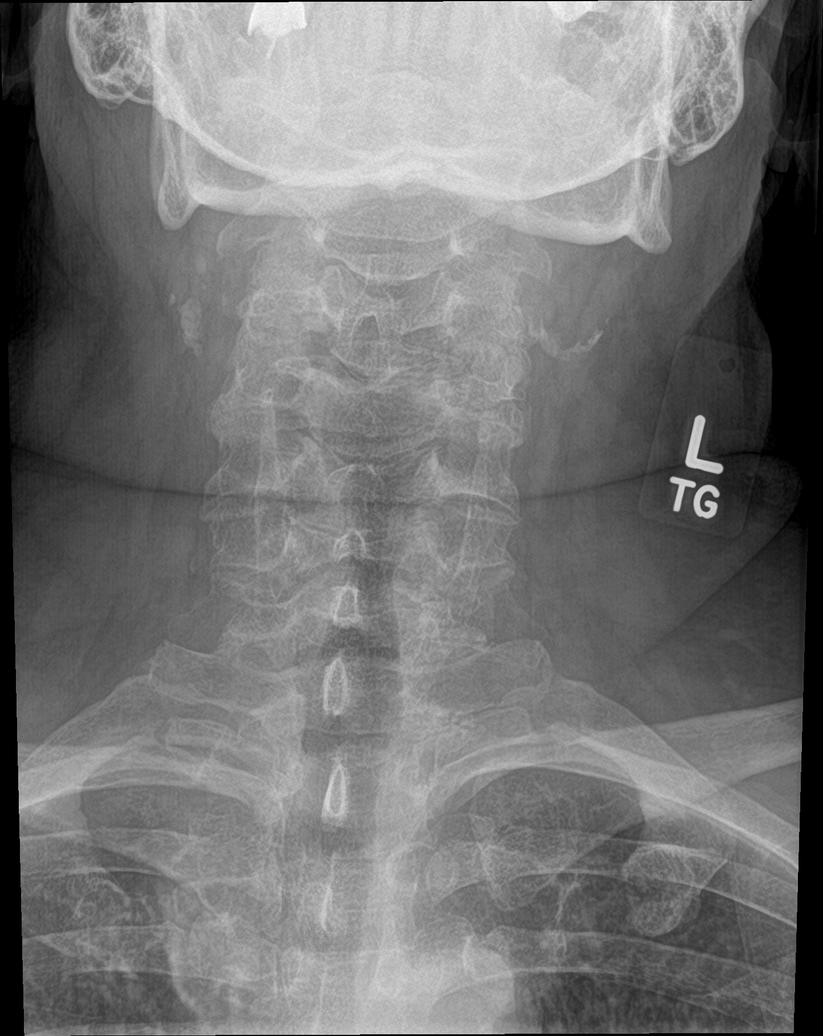

[c-spine open mouth]
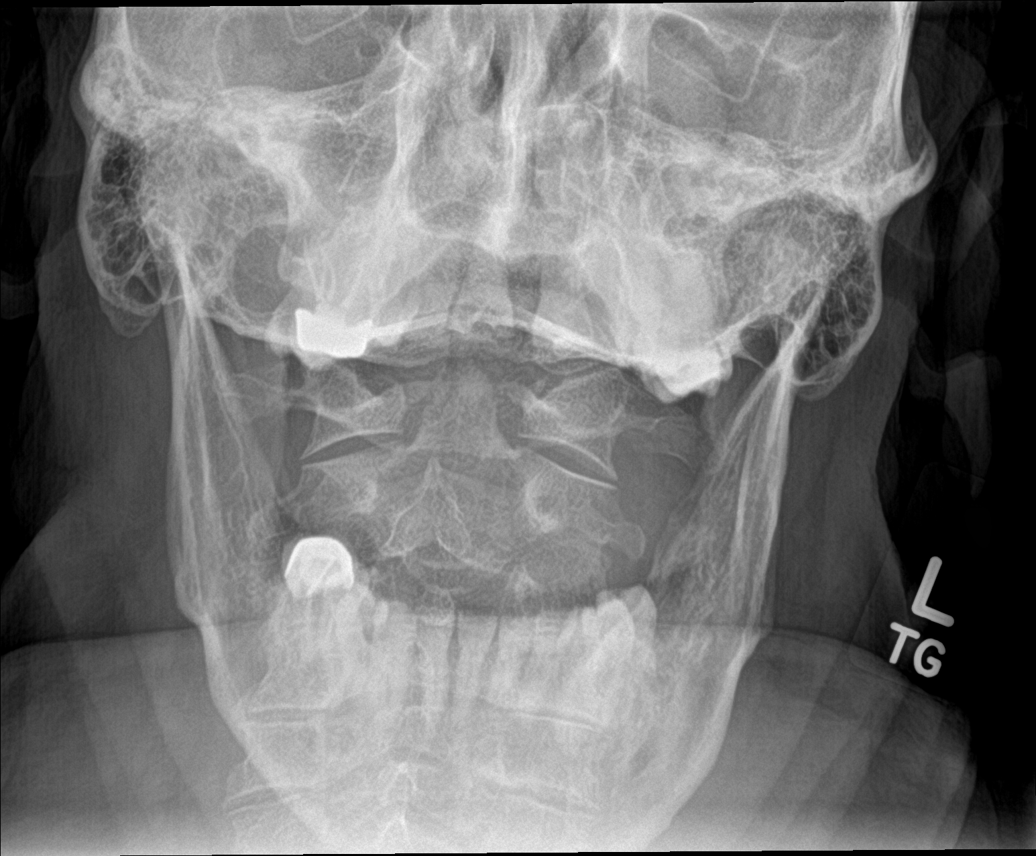

[c-spine swimmers]
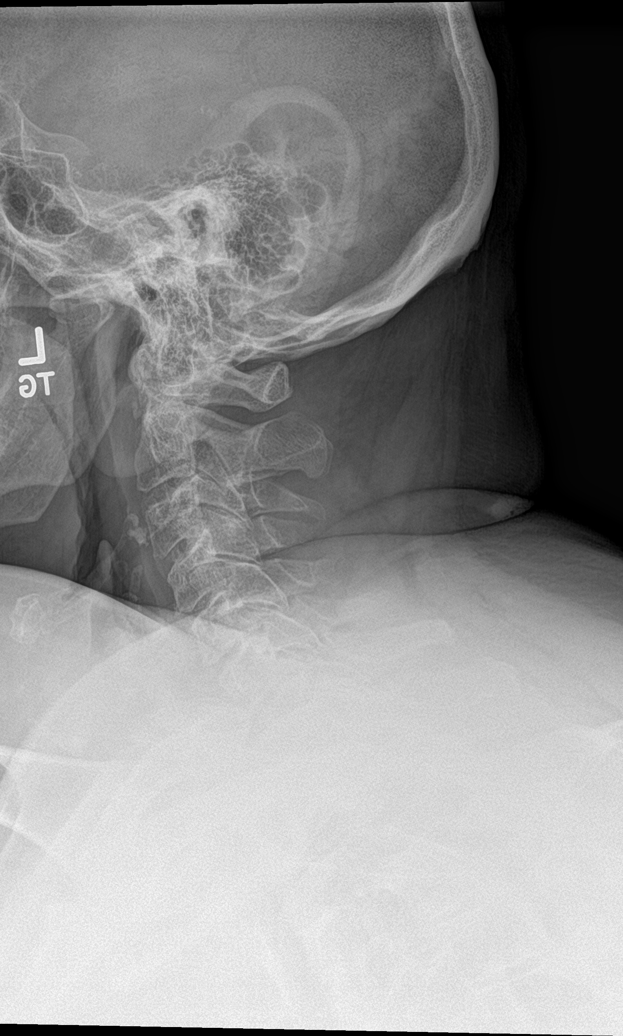

[6 of 6 positions shown; findings below may reference images not displayed]

FINDINGS: The lateral view is diagnostic to the C5-C6 level. There is no acute
fracture or subluxation. Vertebral body heights are preserved.
Alignment is normal. Mild disc height loss and facet uncovertebral
hypertrophy throughout the cervical spine. Mild left-sided
neuroforaminal stenosis from C3-C4 through C6-C7. The right neural
foramina are patent.Normal prevertebral soft tissues. Bilateral
carotid artery calcific atherosclerosis.
IMPRESSION: 1.  No acute osseous abnormality.
2. Mild multilevel degenerative changes throughout the cervical
spine.

## 2019-10-27 ENCOUNTER — Other Ambulatory Visit: Payer: Self-pay | Admitting: Family Medicine

## 2019-10-27 DIAGNOSIS — I10 Essential (primary) hypertension: Secondary | ICD-10-CM

## 2019-11-10 ENCOUNTER — Emergency Department (HOSPITAL_BASED_OUTPATIENT_CLINIC_OR_DEPARTMENT_OTHER)
Admission: EM | Admit: 2019-11-10 | Discharge: 2019-11-10 | Disposition: A | Discharge status: Home / Self Care | Payer: Medicare HMO | Attending: Emergency Medicine | Admitting: Emergency Medicine

## 2019-11-10 ENCOUNTER — Encounter (HOSPITAL_BASED_OUTPATIENT_CLINIC_OR_DEPARTMENT_OTHER): Payer: Self-pay | Admitting: Emergency Medicine

## 2019-11-10 ENCOUNTER — Other Ambulatory Visit: Payer: Self-pay

## 2019-11-10 ENCOUNTER — Emergency Department (HOSPITAL_BASED_OUTPATIENT_CLINIC_OR_DEPARTMENT_OTHER): Payer: Medicare HMO

## 2019-11-10 DIAGNOSIS — Z9861 Coronary angioplasty status: Secondary | ICD-10-CM | POA: Insufficient documentation

## 2019-11-10 DIAGNOSIS — Z87891 Personal history of nicotine dependence: Secondary | ICD-10-CM | POA: Insufficient documentation

## 2019-11-10 DIAGNOSIS — Z79899 Other long term (current) drug therapy: Secondary | ICD-10-CM | POA: Insufficient documentation

## 2019-11-10 DIAGNOSIS — E785 Hyperlipidemia, unspecified: Secondary | ICD-10-CM | POA: Insufficient documentation

## 2019-11-10 DIAGNOSIS — I10 Essential (primary) hypertension: Secondary | ICD-10-CM | POA: Diagnosis not present

## 2019-11-10 DIAGNOSIS — Z7984 Long term (current) use of oral hypoglycemic drugs: Secondary | ICD-10-CM | POA: Insufficient documentation

## 2019-11-10 DIAGNOSIS — R079 Chest pain, unspecified: Secondary | ICD-10-CM

## 2019-11-10 DIAGNOSIS — I2510 Atherosclerotic heart disease of native coronary artery without angina pectoris: Secondary | ICD-10-CM | POA: Insufficient documentation

## 2019-11-10 DIAGNOSIS — R0789 Other chest pain: Secondary | ICD-10-CM | POA: Insufficient documentation

## 2019-11-10 DIAGNOSIS — Z888 Allergy status to other drugs, medicaments and biological substances status: Secondary | ICD-10-CM | POA: Diagnosis not present

## 2019-11-10 DIAGNOSIS — E119 Type 2 diabetes mellitus without complications: Secondary | ICD-10-CM | POA: Diagnosis not present

## 2019-11-10 DIAGNOSIS — Z7982 Long term (current) use of aspirin: Secondary | ICD-10-CM | POA: Insufficient documentation

## 2019-11-10 LAB — BASIC METABOLIC PANEL
Anion gap: 10 (ref 5–15)
BUN: 12 mg/dL (ref 8–23)
CO2: 26 mmol/L (ref 22–32)
Calcium: 9.4 mg/dL (ref 8.9–10.3)
Chloride: 99 mmol/L (ref 98–111)
Creatinine, Ser: 0.83 mg/dL (ref 0.61–1.24)
GFR calc Af Amer: >60 mL/min (ref >60)
GFR calc non Af Amer: >60 mL/min (ref >60)
Glucose, Bld: 130 mg/dL — ABNORMAL HIGH (ref 70–99)
Potassium: 4 mmol/L (ref 3.5–5.1)
Sodium: 135 mmol/L (ref 135–145)

## 2019-11-10 LAB — CBC
HCT: 39.3 % (ref 39.0–52.0)
Hemoglobin: 12.6 g/dL — ABNORMAL LOW (ref 13.0–17.0)
MCH: 27.7 pg (ref 26.0–34.0)
MCHC: 32.1 g/dL (ref 30.0–36.0)
MCV: 86.4 fL (ref 80.0–100.0)
Platelets: 155 10*3/uL (ref 150–400)
RBC: 4.55 MIL/uL (ref 4.22–5.81)
RDW: 13.7 % (ref 11.5–15.5)
WBC: 5.8 10*3/uL (ref 4.0–10.5)
nRBC: 0 % (ref 0.0–0.2)

## 2019-11-10 LAB — TROPONIN I (HIGH SENSITIVITY)
Troponin I (High Sensitivity): 4 ng/L (ref <18)
Troponin I (High Sensitivity): 5 ng/L (ref <18)

## 2019-11-10 MED ORDER — SODIUM CHLORIDE 0.9% FLUSH
3.0000 mL | Freq: Once | INTRAVENOUS | Status: AC
  Administered 2019-11-10: 3 mL via INTRAVENOUS
  Filled 2019-11-10: qty 3

## 2019-11-10 NOTE — ED Provider Notes (Signed)
[facility] Provider Note   CSN: [id number] Arrival date & time: 11/10/19  1728     History Chief Complaint  Patient presents with  . Chest Pain    David Singh is a 69 y.o. male.  He has a history of coronary stent along with diabetes hypertension hyperlipidemia.  He is complaining of on and off chest pain for 3 to 4 days.  He says it is across his lower chest and it is associated with belching.  He says belching relieves the pain.  They recurred again today and has been steady since 130 although it is improved upon arrival here.  It is associated with elevated blood pressure.  He said he is also been under a lot of stress.  This is different than his cardiac pain that got a stent which he said was like an elephant on his chest.  Not associated with any diaphoresis shortness of breath dizziness and pain does not radiate anywhere. The history is provided by the patient.  Chest Pain Pain location:  L chest and R chest Pain quality: tightness   Pain radiates to:  Does not radiate Pain severity:  Moderate Onset quality:  Gradual Timing:  Intermittent Progression:  Improving Chronicity:  New Context: at rest   Relieved by: belching. Worsened by:  Nothing Ineffective treatments:  None tried Associated symptoms: no abdominal pain, no altered mental status, no back pain, no cough, no diaphoresis, no dizziness, no dysphagia, no fever, no headache, no heartburn, no lower extremity edema, no nausea, no palpitations, no shortness of breath, no syncope and no vomiting   Risk factors: coronary artery disease, diabetes mellitus, high cholesterol, hypertension and male sex        Past Medical History:  Diagnosis Date  . CAD (coronary artery disease)   . Cancer ([place])    basal cell excision upper right ear 7 yrs ago  . Complication of anesthesia    bad pain with anesthesia induction with colonscopy 6 yrs ago  . Diabetes mellitus without complication  ([place])    type 2  . GERD (gastroesophageal reflux disease)   . Hyperlipidemia   . Hypertension   . Peripheral vascular disease ([place])    partial clogged artery right leg  . Sleep apnea    yrs ago sleep study no cpap used    Patient Active Problem List   Diagnosis Date Noted  . Chronic gout 03/09/2016  . Hypertriglyceridemia 01/16/2015  . Type 2 diabetes mellitus ([place]) 01/06/2015  . CAD in native artery 01/06/2015  . History of coronary artery stent placement 01/06/2015  . PAD (peripheral artery disease) ([place]) 01/06/2015  . Hyperlipidemia 01/06/2015  . Essential hypertension 01/06/2015    Past Surgical History:  Procedure Laterality Date  . ABDOMINAL AORTOGRAM W/LOWER EXTREMITY N/A 12/27/2016   Procedure: Abdominal Aortogram w/Lower Extremity;  Surgeon: Angelia Mould, MD;  Location: [facility];  Service: Cardiovascular;  Laterality: N/A;  . CARDIAC SURGERY    . CORONARY ANGIOPLASTY WITH STENT PLACEMENT  11/2004   in [place]  . MENISCUS REPAIR Right   . NASAL SINUS SURGERY  1976  . TONSILLECTOMY         Family History  Problem Relation Age of Onset  . Alcohol abuse Brother   . Cancer Brother   . Diabetes Brother   . Hyperlipidemia Brother   . Hypertension Brother   . Diabetes Mother   . Hypertension Mother   . Hyperlipidemia Mother   .  Heart attack Mother   . Hyperlipidemia Father   . Hypertension Father     Social History   Tobacco Use  . Smoking status: Former Smoker    Packs/day: 1.50    Years: 26.00    Pack years: 39.00    Types: Cigarettes    Quit date: 1994    Years since quitting: 27.0  . Smokeless tobacco: Former Network engineer Use Topics  . Alcohol use: Yes    Alcohol/week: 0.0 standard drinks    Comment: 3 ounces on monday  . Drug use: No    Home Medications Prior to Admission medications   Medication Sig Start Date End Date Taking? Authorizing Provider  amLODipine (NORVASC) 10 MG tablet TAKE 1 TABLET BY MOUTH EVERY DAY  08/20/19   Copland, Gay Filler, MD  aspirin 81 MG tablet Take 81 mg by mouth daily.    [provider]  atorvastatin (LIPITOR) 80 MG tablet TAKE 1 TABLET BY MOUTH EVERY DAY 08/20/19   Copland, Gay Filler, MD  Blood Glucose Monitoring Suppl (FREESTYLE LITE) DEVI CHECK BLOOD SUGAR ONCE DAILY 01/19/17   [provider]  Blood Glucose Monitoring Suppl (FREESTYLE LITE) DEVI CHECK BLOOD SUGAR ONCE DAILY 03/08/18   Copland, Gay Filler, MD  busPIRone (BUSPAR) 15 MG tablet Take 1 tablet (15 mg total) by mouth 2 (two) times daily. 09/05/19   Copland, Gay Filler, MD  Cholecalciferol (VITAMIN D3) 2000 units TABS Take 2,000 Units by mouth daily.    [provider]  cloNIDine (CATAPRES) 0.1 MG tablet Take 1 tablet (0.1 mg total) by mouth 2 (two) times daily. 09/10/19   Lelon Perla, MD  Coenzyme Q10 300 MG CAPS Take 300 mg by mouth daily.     [provider]  colchicine 0.6 MG tablet Take 2 tabs by mouth now and then repeat in 1 hour.  May repeat tomorrow if not improved. 12/08/18   Debbrah Alar, NP  Cyanocobalamin (VITAMIN B-12 PO) Take 2,500 mg by mouth daily.    [provider]  cyclobenzaprine (FLEXERIL) 10 MG tablet Take 1 tablet (10 mg total) by mouth 2 (two) times daily as needed for muscle spasms. 05/03/19   Copland, Gay Filler, MD  glipiZIDE (GLUCOTROL XL) 2.5 MG 24 hr tablet TAKE 1 TABLET (2.5 MG TOTAL) BY MOUTH DAILY WITH BREAKFAST. 10/22/19   Copland, Gay Filler, MD  glucose blood (FREESTYLE LITE) test strip Check blood sugar 1-3x daily 06/13/17   Copland, Gay Filler, MD  glucose blood (FREESTYLE LITE) test strip CHECK BLOOD SUGAR ONCE DAILY 11/21/17   Copland, Gay Filler, MD  hydrochlorothiazide (MICROZIDE) 12.5 MG capsule Take 1 capsule (12.5 mg total) by mouth daily. 11/09/18   Copland, Gay Filler, MD  Lancets (FREESTYLE) lancets Check blood sugar once daily 01/19/17   Copland, Gay Filler, MD  lisinopril (ZESTRIL) 40 MG tablet TAKE 1 TABLET BY MOUTH EVERY DAY  10/30/19   Copland, Gay Filler, MD  meloxicam (MOBIC) 15 MG tablet Take 15 mg by mouth daily. 08/06/19   [provider]  metFORMIN (GLUCOPHAGE) 1000 MG tablet TAKE 1 TABLET (1,000 MG TOTAL) BY MOUTH 2 (TWO) TIMES DAILY WITH A MEAL. 12/01/18   Copland, Gay Filler, MD  metoprolol succinate (TOPROL-XL) 100 MG 24 hr tablet TAKE 1 TABLET (100 MG TOTAL) BY MOUTH DAILY. TAKE WITH OR IMMEDIATELY FOLLOWING A MEAL. 06/11/19   Copland, Gay Filler, MD  metoprolol succinate (TOPROL-XL) 50 MG 24 hr tablet Take 1 tablet (50 mg total) by mouth daily.  Take with or immediately following a meal. 09/11/19 12/10/19  Lelon Perla, MD  MULTIPLE VITAMIN PO Take 1 tablet by mouth daily.     [provider]  niacin 500 MG tablet Take 500 mg by mouth every morning.     [provider]  Omega-3 Fatty Acids (EQL OMEGA 3 FISH OIL) 1000 MG CAPS Take 1 capsule by mouth 2 (two) times daily.    [provider]  ONETOUCH VERIO test strip USE TO TEST BLOOD SUGAR 1-3 TIMES DAILY 10/22/19   Copland, Gay Filler, MD  pantoprazole (PROTONIX) 40 MG tablet TAKE 1 TABLET BY MOUTH EVERY DAY 10/22/19   Copland, Gay Filler, MD  prednisoLONE acetate (PRED FORTE) 1 % ophthalmic suspension INSTILL 1 DROP INTO LEFT EYE TWICE A DAY 08/01/18   [provider]  predniSONE (DELTASONE) 20 MG tablet Take 1 daily for 5-7 days for nasal congestion 01/17/19   Copland, Gay Filler, MD  Probiotic Product (PROBIOTIC ADVANCED PO) Take by mouth every evening. [employer] brand takes 2 tabs    [provider]  triamcinolone (NASACORT) 55 MCG/ACT AERO nasal inhaler Place 2 sprays into the nose daily. 01/17/19   Copland, Gay Filler, MD  UNABLE TO FIND Take 500 mcg by mouth daily. Med Name: Vitamin K2    [provider]  UNABLE TO FIND Med Name: Natural MK-7-100 MCG WITH MK-4-100MG     [provider]    Allergies    Dexilant [dexlansoprazole]  Review of Systems   Review of Systems  Constitutional: Negative  for diaphoresis and fever.  HENT: Negative for sore throat and trouble swallowing.   Eyes: Negative for visual disturbance.  Respiratory: Negative for cough and shortness of breath.   Cardiovascular: Positive for chest pain. Negative for palpitations and syncope.  Gastrointestinal: Negative for abdominal pain, heartburn, nausea and vomiting.  Genitourinary: Negative for dysuria.  Musculoskeletal: Negative for back pain.  Skin: Negative for rash.  Neurological: Negative for dizziness and headaches.    Physical Exam Updated Vital Signs BP (!) 189/108 (BP Location: Left Arm)   Pulse 82   Temp 98.2 F (36.8 C) (Oral)   Resp 18   Ht 5\' 9"  (1.753 m)   Wt 134.3 kg   SpO2 100%   BMI 43.71 kg/m   Physical Exam Vitals and nursing note reviewed.  Constitutional:      Appearance: He is well-developed.  HENT:     Head: Normocephalic and atraumatic.  Eyes:     Conjunctiva/sclera: Conjunctivae normal.  Cardiovascular:     Rate and Rhythm: Normal rate and regular rhythm.     Heart sounds: Normal heart sounds. No murmur.  Pulmonary:     Effort: Pulmonary effort is normal. No respiratory distress.     Breath sounds: Normal breath sounds.  Abdominal:     Palpations: Abdomen is soft.     Tenderness: There is no abdominal tenderness.  Musculoskeletal:        General: Normal range of motion.     Cervical back: Neck supple.     Right lower leg: No tenderness. No edema.     Left lower leg: No tenderness. No edema.  Skin:    General: Skin is warm and dry.     Capillary Refill: Capillary refill takes less than 2 seconds.  Neurological:     General: No focal deficit present.     Mental Status: He is alert.     ED Results / Procedures / Treatments   Labs (  all labs ordered are listed, but only abnormal results are displayed) Labs Reviewed  CBC - Abnormal; Notable for the following components:      Result Value   Hemoglobin 12.6 (*)    All other components within normal limits    BASIC METABOLIC PANEL - Abnormal; Notable for the following components:   Glucose, Bld 130 (*)    All other components within normal limits  TROPONIN I (HIGH SENSITIVITY)  TROPONIN I (HIGH SENSITIVITY)    EKG EKG Interpretation  Date/Time:  Saturday November 10 2019 17:36:03 EST Ventricular Rate:  80 PR Interval:  152 QRS Duration: 108 QT Interval:  388 QTC Calculation: 447 R Axis:   -18 Text Interpretation: Normal sinus rhythm Normal ECG No significant change since 10/17 Confirmed by Aletta Edouard [phone]) on 11/10/2019 5:44:02 PM   Radiology DG Chest Portable 1 View  Result Date: 11/10/2019 CLINICAL DATA:  69 year old male with chest pain. EXAM: PORTABLE CHEST 1 VIEW COMPARISON:  Chest radiograph dated 08/18/2016. FINDINGS: No focal consolidation, pleural effusion, pneumothorax. The cardiac silhouette is within normal limits. Atherosclerotic calcification of the aortic arch. No acute osseous pathology. IMPRESSION: No active disease. Electronically Signed   By: Anner Crete M.D.   On: 11/10/2019 18:27    Procedures Procedures (including critical care time)  Medications Ordered in ED Medications  sodium chloride flush (NS) 0.9 % injection 3 mL (has no administration in time range)    ED Course  I have reviewed the triage vital signs and the nursing notes.  Pertinent labs & imaging results that were available during my care of the patient were reviewed by me and considered in my medical decision making (see chart for details).  Clinical Course as of Nov 10 1157  Sat Jan 16, 955  [id number] 69 year old male with known coronary disease here with lower chest discomfort and belching that is been intermittent for a few days.  Differential includes ACS, GERD, musculoskeletal, pneumothorax, vascular disease.  Less likely PE satting 100% on room air not pleuritic.   [MB]    Clinical Course User Index [MB] Hayden Rasmussen, MD   MDM Rules/Calculators/A&P                        Final Clinical Impression(s) / ED Diagnoses Final diagnoses:  Nonspecific chest pain    Rx / DC Orders ED Discharge Orders    None       Hayden Rasmussen, MD 11/11/19 1159

## 2019-11-10 NOTE — ED Notes (Signed)
Ambulated to bathroom with steady gait

## 2019-11-10 NOTE — Discharge Instructions (Addendum)
You were seen in the emergency department for chest pain and belching.  Your blood work chest x-ray and EKG did not show any serious findings.  This is likely related to some reflux you can try Tums or Maalox.  Please contact your cardiologist for follow-up.  Return to the emergency department if any concerning symptoms.

## 2019-11-10 NOTE — ED Triage Notes (Signed)
Patient states that he has had mid epigastric and chest pain x 3 days - today he has had a lot of belching and after it " lets up" - the patient reports that today it has not let up and he has had High BP

## 2019-11-17 NOTE — Progress Notes (Deleted)
HPI: FUCAD.Patienthad PCI in [place] previously. No records available. Patient is followed by vascular surgery for peripheral vascular disease. Angiogram March 2018 showed no renal artery stenosis. 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 There was reconstitution of the distal right common iliac artery; it was felt medical therapy indicated.Nuclear study April 2018 showed ejection fraction 54% with no ischemia or infarction. ABIs 1/20 showed moderate disease on the right and normal on the left. Patient apparently had attempt at iliac recanalization in [facility] which was unsuccessful March 2020. Patient seen in the emergency room January 2021 with chest pain.  Troponins normal.  Chest x-ray without infiltrate.  Since last seen,   Current Outpatient Medications  Medication Sig Dispense Refill  . amLODipine (NORVASC) 10 MG tablet TAKE 1 TABLET BY MOUTH EVERY DAY 90 tablet 3  . aspirin 81 MG tablet Take 81 mg by mouth daily.    Marland Kitchen atorvastatin (LIPITOR) 80 MG tablet TAKE 1 TABLET BY MOUTH EVERY DAY 90 tablet 3  . Blood Glucose Monitoring Suppl (FREESTYLE LITE) DEVI CHECK BLOOD SUGAR ONCE DAILY  0  . Blood Glucose Monitoring Suppl (FREESTYLE LITE) DEVI CHECK BLOOD SUGAR ONCE DAILY 1 each 0  . busPIRone (BUSPAR) 15 MG tablet Take 1 tablet (15 mg total) by mouth 2 (two) times daily. 180 tablet 3  . Cholecalciferol (VITAMIN D3) 2000 units TABS Take 2,000 Units by mouth daily.    . cloNIDine (CATAPRES) 0.1 MG tablet Take 1 tablet (0.1 mg total) by mouth 2 (two) times daily. 60 tablet 11  . Coenzyme Q10 300 MG CAPS Take 300 mg by mouth daily.     . colchicine 0.6 MG tablet Take 2 tabs by mouth now and then repeat in 1 hour.  May repeat tomorrow if not improved. 6 tablet 0  . Cyanocobalamin (VITAMIN B-12 PO) Take 2,500 mg by mouth daily.    . cyclobenzaprine (FLEXERIL) 10 MG tablet Take 1 tablet (10 mg total) by mouth 2 (two)  times daily as needed for muscle spasms. 30 tablet 0  . glipiZIDE (GLUCOTROL XL) 2.5 MG 24 hr tablet TAKE 1 TABLET (2.5 MG TOTAL) BY MOUTH DAILY WITH BREAKFAST. 90 tablet 1  . glucose blood (FREESTYLE LITE) test strip Check blood sugar 1-3x daily 100 each 12  . glucose blood (FREESTYLE LITE) test strip CHECK BLOOD SUGAR ONCE DAILY 100 each 2  . hydrochlorothiazide (MICROZIDE) 12.5 MG capsule Take 1 capsule (12.5 mg total) by mouth daily. 90 capsule 3  . Lancets (FREESTYLE) lancets Check blood sugar once daily 100 each 12  . lisinopril (ZESTRIL) 40 MG tablet TAKE 1 TABLET BY MOUTH EVERY DAY 90 tablet 3  . meloxicam (MOBIC) 15 MG tablet Take 15 mg by mouth daily.    . metFORMIN (GLUCOPHAGE) 1000 MG tablet TAKE 1 TABLET (1,000 MG TOTAL) BY MOUTH 2 (TWO) TIMES DAILY WITH A MEAL. 180 tablet 3  . metoprolol succinate (TOPROL-XL) 100 MG 24 hr tablet TAKE 1 TABLET (100 MG TOTAL) BY MOUTH DAILY. TAKE WITH OR IMMEDIATELY FOLLOWING A MEAL. 90 tablet 1  . metoprolol succinate (TOPROL-XL) 50 MG 24 hr tablet Take 1 tablet (50 mg total) by mouth daily. Take with or immediately following a meal. 90 tablet 3  . MULTIPLE VITAMIN PO Take 1 tablet by mouth daily.     . niacin 500 MG tablet Take 500 mg by mouth every morning.     Marland Kitchen  Omega-3 Fatty Acids (EQL OMEGA 3 FISH OIL) 1000 MG CAPS Take 1 capsule by mouth 2 (two) times daily.    [name] VERIO test strip USE TO TEST BLOOD SUGAR 1-3 TIMES DAILY 100 strip 3  . pantoprazole (PROTONIX) 40 MG tablet TAKE 1 TABLET BY MOUTH EVERY DAY 90 tablet 1  . prednisoLONE acetate (PRED FORTE) 1 % ophthalmic suspension INSTILL 1 DROP INTO LEFT EYE TWICE A DAY    . predniSONE (DELTASONE) 20 MG tablet Take 1 daily for 5-7 days for nasal congestion 7 tablet 0  . Probiotic Product (PROBIOTIC ADVANCED PO) Take by mouth every evening. [employer] brand takes 2 tabs    . triamcinolone (NASACORT) 55 MCG/ACT AERO nasal inhaler Place 2 sprays into the nose daily. 1 Inhaler 12  . UNABLE TO FIND  Take 500 mcg by mouth daily. Med Name: Vitamin K2    . UNABLE TO FIND Med Name: Natural MK-7-100 MCG WITH MK-4-100MG      No current facility-administered medications for this visit.     Past Medical History:  Diagnosis Date  . CAD (coronary artery disease)   . Cancer ([place])    basal cell excision upper right ear 7 yrs ago  . Complication of anesthesia    bad pain with anesthesia induction with colonscopy 6 yrs ago  . Diabetes mellitus without complication ([place])    type 2  . GERD (gastroesophageal reflux disease)   . Hyperlipidemia   . Hypertension   . Peripheral vascular disease ([place])    partial clogged artery right leg  . Sleep apnea    yrs ago sleep study no cpap used    Past Surgical History:  Procedure Laterality Date  . ABDOMINAL AORTOGRAM W/LOWER EXTREMITY N/A 12/27/2016   Procedure: Abdominal Aortogram w/Lower Extremity;  Surgeon: Angelia Mould, MD;  Location: [facility];  Service: Cardiovascular;  Laterality: N/A;  . CARDIAC SURGERY    . CORONARY ANGIOPLASTY WITH STENT PLACEMENT  11/2004   in [place]  . MENISCUS REPAIR Right   . NASAL SINUS SURGERY  1976  . TONSILLECTOMY      Social History   Socioeconomic History  . Marital status: Married    Spouse name: Not on file  . Number of children: 2  . Years of education: Not on file  . Highest education level: Not on file  Occupational History  . Not on file  Tobacco Use  . Smoking status: Former Smoker    Packs/day: 1.50    Years: 26.00    Pack years: 39.00    Types: Cigarettes    Quit date: 1994    Years since quitting: 27.0  . Smokeless tobacco: Former Network engineer and Sexual Activity  . Alcohol use: Yes    Alcohol/week: 0.0 standard drinks    Comment: 3 ounces on monday  . Drug use: No  . Sexual activity: Yes  Other Topics Concern  . Not on file  Social History Narrative  . Not on file   Social Determinants of Health   Financial Resource Strain:   . Difficulty of Paying  Living Expenses: Not on file  Food Insecurity:   . Worried About Charity fundraiser in the Last Year: Not on file  . Ran Out of Food in the Last Year: Not on file  Transportation Needs:   . Lack of Transportation (Medical): Not on file  . Lack of Transportation (Non-Medical): Not on file  Physical Activity:   . Days of  Exercise per Week: Not on file  . Minutes of Exercise per Session: Not on file  Stress:   . Feeling of Stress : Not on file  Social Connections:   . Frequency of Communication with Friends and Family: Not on file  . Frequency of Social Gatherings with Friends and Family: Not on file  . Attends Religious Services: Not on file  . Active Member of Clubs or Organizations: Not on file  . Attends Archivist Meetings: Not on file  . Marital Status: Not on file  Intimate Partner Violence:   . Fear of Current or Ex-Partner: Not on file  . Emotionally Abused: Not on file  . Physically Abused: Not on file  . Sexually Abused: Not on file    Family History  Problem Relation Age of Onset  . Alcohol abuse Brother   . Cancer Brother   . Diabetes Brother   . Hyperlipidemia Brother   . Hypertension Brother   . Diabetes Mother   . Hypertension Mother   . Hyperlipidemia Mother   . Heart attack Mother   . Hyperlipidemia Father   . Hypertension Father     ROS: no fevers or chills, productive cough, hemoptysis, dysphasia, odynophagia, melena, hematochezia, dysuria, hematuria, rash, seizure activity, orthopnea, PND, pedal edema, claudication. Remaining systems are negative.  Physical Exam: Well-developed well-nourished in no acute distress.  Skin is warm and dry.  HEENT is normal.  Neck is supple.  Chest is clear to auscultation with normal expansion.  Cardiovascular exam is regular rate and rhythm.  Abdominal exam nontender or distended. No masses palpated. Extremities show no edema. neuro grossly intact  ECG- personally reviewed  A/P  1 recent chest  pain-troponins were normal.  2 coronary artery disease-plan to continue aspirin and statin.  3 hypertension-blood pressure controlled.  Continue present medical regimen.  4 hyperlipidemia-continue statin.  5 peripheral vascular disease-followed by vascular surgery.  Previous attempt at recanalization of iliac unsuccessful.  6 morbid obesity-we discussed the importance of exercise and weight loss.  Kirk Ruths, MD

## 2019-11-19 ENCOUNTER — Encounter: Payer: Self-pay | Admitting: *Deleted

## 2019-11-21 ENCOUNTER — Other Ambulatory Visit: Payer: Self-pay

## 2019-11-21 ENCOUNTER — Other Ambulatory Visit: Payer: Self-pay | Admitting: *Deleted

## 2019-11-21 ENCOUNTER — Encounter: Payer: Self-pay | Admitting: Cardiology

## 2019-11-21 ENCOUNTER — Ambulatory Visit: Payer: Medicare HMO | Admitting: Cardiology

## 2019-11-21 MED ORDER — CLONIDINE HCL 0.2 MG PO TABS: 0.2000 mg | ORAL_TABLET | Freq: Two times a day (BID) | ORAL | 3 refills | Status: DC

## 2019-11-21 NOTE — Progress Notes (Signed)
Virtual Visit via Video Note changed to phone visit at pt request   This visit type was conducted due to national recommendations for restrictions regarding the COVID-19 Pandemic (e.g. social distancing) in an effort to limit this patient's exposure and mitigate transmission in our community.  Due to his co-morbid illnesses, this patient is at least at moderate risk for complications without adequate follow up.  This format is felt to be most appropriate for this patient at this time.  All issues noted in this document were discussed and addressed.  A limited physical exam was performed with this format.  Please refer to the patient's chart for his consent to telehealth for [facility].    HPI: David Singh.Patienthad PCI in [place] previously. No records available. Patient is followed by vascular surgery for peripheral vascular disease. Angiogram March 2018 showed no renal artery stenosis. 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 There was reconstitution of the distal right common iliac artery; it was felt medical therapy indicated.Nuclear study April 2018 showed ejection fraction 54% with no ischemia or infarction. ABIs 1/20 showed moderate disease on the right and normal on the left. Patient apparently had attempt at iliac recanalization in [facility] which was unsuccessful March 2020. Patient seen in the emergency room January 2021 with chest pain and hypertension.  Troponins normal.  Chest x-ray without infiltrate.  Since last seen,  patient denies dyspnea, exertional chest pain or syncope.  The chest pain he had when he went to the emergency room was different than his previous cardiac pain and lasted all day.  He feels it may have been stress related.  He does have claudication in his right lower extremity and is followed by vascular surgery.  He would prefer medical therapy for this at this point.  Current Outpatient Medications   Medication Sig Dispense Refill  . amLODipine (NORVASC) 10 MG tablet TAKE 1 TABLET BY MOUTH EVERY DAY 90 tablet 3  . aspirin 81 MG tablet Take 81 mg by mouth daily.    Marland Kitchen atorvastatin (LIPITOR) 80 MG tablet TAKE 1 TABLET BY MOUTH EVERY DAY 90 tablet 3  . Blood Glucose Monitoring Suppl (FREESTYLE LITE) DEVI CHECK BLOOD SUGAR ONCE DAILY  0  . Blood Glucose Monitoring Suppl (FREESTYLE LITE) DEVI CHECK BLOOD SUGAR ONCE DAILY 1 each 0  . busPIRone (BUSPAR) 15 MG tablet Take 1 tablet (15 mg total) by mouth 2 (two) times daily. (Patient taking differently: Take 10 mg by mouth 2 (two) times daily. ) 180 tablet 3  . Cholecalciferol (VITAMIN D3) 2000 units TABS Take 2,000 Units by mouth daily.    . cloNIDine (CATAPRES) 0.2 MG tablet Take 1 tablet (0.2 mg total) by mouth 2 (two) times daily. 180 tablet 3  . Coenzyme Q10 300 MG CAPS Take 300 mg by mouth daily.     . Cyanocobalamin (VITAMIN B-12 PO) Take 2,500 mg by mouth daily.    . cyclobenzaprine (FLEXERIL) 10 MG tablet Take 1 tablet (10 mg total) by mouth 2 (two) times daily as needed for muscle spasms. 30 tablet 0  . glipiZIDE (GLUCOTROL XL) 2.5 MG 24 hr tablet TAKE 1 TABLET (2.5 MG TOTAL) BY MOUTH DAILY WITH BREAKFAST. 90 tablet 1  . glucose blood (FREESTYLE LITE) test strip Check blood sugar 1-3x daily 100 each 12  . glucose blood (FREESTYLE LITE) test strip CHECK BLOOD SUGAR ONCE DAILY 100 each 2  . Lancets (FREESTYLE) lancets Check blood  sugar once daily 100 each 12  . lisinopril (ZESTRIL) 40 MG tablet TAKE 1 TABLET BY MOUTH EVERY DAY 90 tablet 3  . meloxicam (MOBIC) 15 MG tablet Take 15 mg by mouth daily.    . metFORMIN (GLUCOPHAGE) 1000 MG tablet TAKE 1 TABLET (1,000 MG TOTAL) BY MOUTH 2 (TWO) TIMES DAILY WITH A MEAL. 180 tablet 3  . metoprolol succinate (TOPROL-XL) 100 MG 24 hr tablet TAKE 1 TABLET (100 MG TOTAL) BY MOUTH DAILY. TAKE WITH OR IMMEDIATELY FOLLOWING A MEAL. 90 tablet 1  . metoprolol succinate (TOPROL-XL) 50 MG 24 hr tablet Take 1  tablet (50 mg total) by mouth daily. Take with or immediately following a meal. 90 tablet 3  . MULTIPLE VITAMIN PO Take 1 tablet by mouth daily.     . niacin 500 MG tablet Take 500 mg by mouth every morning.     . Omega-3 Fatty Acids (EQL OMEGA 3 FISH OIL) 1000 MG CAPS Take 1 capsule by mouth 2 (two) times daily.    [name] VERIO test strip USE TO TEST BLOOD SUGAR 1-3 TIMES DAILY 100 strip 3  . pantoprazole (PROTONIX) 40 MG tablet TAKE 1 TABLET BY MOUTH EVERY DAY 90 tablet 1  . prednisoLONE acetate (PRED FORTE) 1 % ophthalmic suspension INSTILL 1 DROP INTO LEFT EYE TWICE A DAY    . Probiotic Product (PROBIOTIC ADVANCED PO) Take by mouth every evening. [employer]co brand takes 2 tabs    . UNABLE TO FIND Take 500 mcg by mouth daily. Med Name: Vitamin K2    . UNABLE TO FIND Med Name: Natural MK-7-100 MCG WITH MK-4-100MG      No current facility-administered medications for this visit.     Past Medical History:  Diagnosis Date  . CAD (coronary artery disease)   . Cancer ([place])    basal cell excision upper right ear 7 yrs ago  . Complication of anesthesia    bad pain with anesthesia induction with colonscopy 6 yrs ago  . Diabetes mellitus without complication ([place])    type 2  . GERD (gastroesophageal reflux disease)   . Hyperlipidemia   . Hypertension   . Peripheral vascular disease ([place])    partial clogged artery right leg  . Sleep apnea    yrs ago sleep study no cpap used    Past Surgical History:  Procedure Laterality Date  . ABDOMINAL AORTOGRAM W/LOWER EXTREMITY N/A 12/27/2016   Procedure: Abdominal Aortogram w/Lower Extremity;  Surgeon: Angelia Mould, MD;  Location: [facility];  Service: Cardiovascular;  Laterality: N/A;  . CARDIAC SURGERY    . CORONARY ANGIOPLASTY WITH STENT PLACEMENT  11/2004   in [place]  . MENISCUS REPAIR Right   . NASAL SINUS SURGERY  1976  . TONSILLECTOMY      Social History   Socioeconomic History  . Marital status: Married    Spouse  name: Not on file  . Number of children: 2  . Years of education: Not on file  . Highest education level: Not on file  Occupational History  . Not on file  Tobacco Use  . Smoking status: Former Smoker    Packs/day: 1.50    Years: 26.00    Pack years: 39.00    Types: Cigarettes    Quit date: 1994    Years since quitting: 27.1  . Smokeless tobacco: Former Network engineer and Sexual Activity  . Alcohol use: Yes    Alcohol/week: 0.0 standard drinks    Comment: 3  ounces on monday  . Drug use: No  . Sexual activity: Yes  Other Topics Concern  . Not on file  Social History Narrative  . Not on file   Social Determinants of Health   Financial Resource Strain:   . Difficulty of Paying Living Expenses: Not on file  Food Insecurity:   . Worried About Charity fundraiser in the Last Year: Not on file  . Ran Out of Food in the Last Year: Not on file  Transportation Needs:   . Lack of Transportation (Medical): Not on file  . Lack of Transportation (Non-Medical): Not on file  Physical Activity:   . Days of Exercise per Week: Not on file  . Minutes of Exercise per Session: Not on file  Stress:   . Feeling of Stress : Not on file  Social Connections:   . Frequency of Communication with Friends and Family: Not on file  . Frequency of Social Gatherings with Friends and Family: Not on file  . Attends Religious Services: Not on file  . Active Member of Clubs or Organizations: Not on file  . Attends Archivist Meetings: Not on file  . Marital Status: Not on file  Intimate Partner Violence:   . Fear of Current or Ex-Partner: Not on file  . Emotionally Abused: Not on file  . Physically Abused: Not on file  . Sexually Abused: Not on file    Family History  Problem Relation Age of Onset  . Alcohol abuse Brother   . Cancer Brother   . Diabetes Brother   . Hyperlipidemia Brother   . Hypertension Brother   . Diabetes Mother   . Hypertension Mother   . Hyperlipidemia Mother    . Heart attack Mother   . Hyperlipidemia Father   . Hypertension Father     ROS: no fevers or chills, productive cough, hemoptysis, dysphasia, odynophagia, melena, hematochezia, dysuria, hematuria, rash, seizure activity, orthopnea, PND, pedal edema, claudication. Remaining systems are negative.  Physical Exam: No acute distress Answers questions appropriately Normal affect Remainder physical examination not performed (telehealth visit; coronavirus pandemic)  Electrocardiogram November 10, 2019-sinus rhythm with no ST changes.  Personally reviewed.  A/P  1 recent chest pain-troponins were normal.  Electrocardiogram showed no ST changes.  Symptoms were not similar to previous cardiac pain and he does not have exertional chest pain.  We will not pursue further ischemia evaluation at this point.  2 coronary artery disease-plan to continue aspirin and statin.  3 hypertension-blood pressure elevated.  Discontinue Toprol.  Instead we will treat with carvedilol 25 mg twice daily.  Follow blood pressure and adjust regimen as needed.  4 hyperlipidemia-continue statin.  5 peripheral vascular disease-followed by vascular surgery.  Previous attempt at recanalization of iliac unsuccessful.  6 morbid obesity-we discussed the importance of exercise and weight loss.  COVID-19 Education: The importance of social distancing was discussed today.  Time:   Today, I have spent 15 minutes with the patient with telehealth technology discussing the above problems.     Medication Adjustments/Labs and Tests Ordered: Current medicines are reviewed at length with the patient today.  Concerns regarding medicines are outlined above.   Tests Ordered: No orders of the defined types were placed in this encounter.   Medication Changes: No orders of the defined types were placed in this encounter.   Follow Up:  FU 6 months  Signed, Kirk Ruths, MD  11/21/2019 9:41 AM    [facility]

## 2019-11-21 NOTE — Telephone Encounter (Signed)
Contacted pt. Pt states he has not had CP since his visit to the ED. Reviewed cardiac meds. Pt confirms he takes all cardiac meds on current med list (including both Toprol -XL 100 mg and 50 mg) except HCTZ 12.5 mg. Per pt, he has not taken this medication for a while and this was d/c either by Dr. Stanford Breed or his PCP Dr. Lorelei Pont. Pt confirms virtual appt rescheduled to next week. Informed pt that his message to be routed to Dr. Stanford Breed for review. Pt agreeable

## 2019-11-21 NOTE — Telephone Encounter (Signed)
error 

## 2019-11-29 ENCOUNTER — Encounter: Payer: Self-pay | Admitting: Cardiology

## 2019-11-29 ENCOUNTER — Telehealth (INDEPENDENT_AMBULATORY_CARE_PROVIDER_SITE_OTHER): Payer: Medicare HMO | Admitting: Cardiology

## 2019-11-29 VITALS — BP 170/100 | HR 77 | Temp 98.7°F | Ht 69.0 in | Wt 300.0 lb

## 2019-11-29 DIAGNOSIS — I739 Peripheral vascular disease, unspecified: Secondary | ICD-10-CM

## 2019-11-29 DIAGNOSIS — E7800 Pure hypercholesterolemia, unspecified: Secondary | ICD-10-CM | POA: Diagnosis not present

## 2019-11-29 DIAGNOSIS — I2510 Atherosclerotic heart disease of native coronary artery without angina pectoris: Secondary | ICD-10-CM | POA: Diagnosis not present

## 2019-11-29 DIAGNOSIS — I10 Essential (primary) hypertension: Secondary | ICD-10-CM | POA: Diagnosis not present

## 2019-11-29 MED ORDER — CARVEDILOL 25 MG PO TABS: 25.0000 mg | ORAL_TABLET | Freq: Two times a day (BID) | ORAL | 3 refills | Status: DC

## 2019-11-29 NOTE — Patient Instructions (Signed)
Medication Instructions:  STOP METOPROLOL  START CARVEDILOL 25 MG TWICE DAILY  *If you need a refill on your cardiac medications before your next appointment, please call your pharmacy*  Lab Work: If you have labs (blood work) drawn today and your tests are completely normal, you will receive your results only by: Marland Kitchen MyChart Message (if you have MyChart) OR . A paper copy in the mail If you have any lab test that is abnormal or we need to change your treatment, we will call you to review the results.  Follow-Up: At [facility], you and your health needs are our priority.  As part of our continuing mission to provide you with exceptional heart care, we have created designated Provider Care Teams.  These Care Teams include your primary Cardiologist (physician) and Advanced Practice Providers (APPs -  Physician Assistants and Nurse Practitioners) who all work together to provide you with the care you need, when you need it.  Your next appointment:   6 month(s)  The format for your next appointment:   Either In Person or Virtual  Provider:   Kirk Ruths, MD

## 2019-12-23 ENCOUNTER — Encounter: Payer: Self-pay | Admitting: Family Medicine

## 2019-12-24 ENCOUNTER — Other Ambulatory Visit: Payer: Self-pay | Admitting: Family Medicine

## 2020-01-09 ENCOUNTER — Encounter: Payer: Self-pay | Admitting: Family Medicine

## 2020-01-09 DIAGNOSIS — E119 Type 2 diabetes mellitus without complications: Secondary | ICD-10-CM

## 2020-01-17 ENCOUNTER — Encounter: Payer: Self-pay | Admitting: Family Medicine

## 2020-01-17 ENCOUNTER — Ambulatory Visit (INDEPENDENT_AMBULATORY_CARE_PROVIDER_SITE_OTHER): Payer: Medicare HMO | Admitting: Family Medicine

## 2020-01-17 ENCOUNTER — Other Ambulatory Visit: Payer: Self-pay

## 2020-01-17 DIAGNOSIS — I10 Essential (primary) hypertension: Secondary | ICD-10-CM

## 2020-01-17 DIAGNOSIS — I739 Peripheral vascular disease, unspecified: Secondary | ICD-10-CM | POA: Diagnosis not present

## 2020-01-17 DIAGNOSIS — E119 Type 2 diabetes mellitus without complications: Secondary | ICD-10-CM

## 2020-01-17 DIAGNOSIS — F411 Generalized anxiety disorder: Secondary | ICD-10-CM

## 2020-01-17 DIAGNOSIS — E785 Hyperlipidemia, unspecified: Secondary | ICD-10-CM | POA: Diagnosis not present

## 2020-01-17 DIAGNOSIS — R69 Illness, unspecified: Secondary | ICD-10-CM | POA: Diagnosis not present

## 2020-01-17 NOTE — Progress Notes (Signed)
[place] at [facility] [address], [place], [place] [address] [phone]  Date:  01/17/2020   Name:  David Singh   DOB:  [birth year]   MRN:  [id number]  PCP:  Darreld Mclean, MD    Chief Complaint: No chief complaint on file.   History of Present Illness:  David Singh is a 69 y.o. very pleasant male patient who presents with the following:  Virtual visit today for follow-up; patient location is home, provider location is office.  Patient identity confirmed with 2 factors, he gives consent for virtual visit today Pt and myself are on the call today Patient with history of CAD status post PCI in 2006, PAD, hypertension, diabetes, hyperlipidemia, gout  His cardiologist is Dr. Stanford Breed, last visit with him in February His PCI was performed in [place]  Patient had recently contacted me for an endocrinology referral- his appt is not until July. He is on a cancellation list to perhaps be seen earlier Otherwise I saw him most recently in November At that time he was having more symptoms of depression-we increase his dosage of BuSpar to 50 mg twice a day He was noted to have mild hyponatremia in November  Lab Results  Component Value Date   HGBA1C 6.9 (H) 09/05/2019   He has fairly severe claudication that limits his activity.  He is able to walk about 2 blocks before pain causes him to stop and rest  His urologist is Dr. Felipa Eth He also follows with vascular surgery, Dr. Radene Knee at [facility]  Today pt notes a plethora of sx which he thinks may be related to using metformin.  He notes that the symptoms have been present for several months, at one point he says 3 months and another time says 1 year.  He lists "shallow breathing every once in a while," lower back and side pain sometimes, difficulty urinating, not sleeping well-  He may feel dizzy off and on He has felt more hungry and gained some weight He  may feel nervous, his chest can feel tight.  He notes that he was seen at "urgent care" twice for this issue over the last several months.  I do see an ER visit from January but cannot see any other urgent care or ER visits this year He has also noted "extreme diarrhea" a few times a week He notes that he tends to not have much energy, he feels tired all day He may have some depression  He notes that his blood sugar has been "all over the place"- he had a low of 38 earlier this month  He may go up higher as well, up to maybe 200  He is taking Metformin 2000 mg a day right now   Patient Active Problem List   Diagnosis Date Noted  . Chronic gout 03/09/2016  . Hypertriglyceridemia 01/16/2015  . Type 2 diabetes mellitus ([place]) 01/06/2015  . CAD in native artery 01/06/2015  . History of coronary artery stent placement 01/06/2015  . PAD (peripheral artery disease) ([place]) 01/06/2015  . Hyperlipidemia 01/06/2015  . Essential hypertension 01/06/2015    Past Medical History:  Diagnosis Date  . CAD (coronary artery disease)   . Cancer ([place])    basal cell excision upper right ear 7 yrs ago  . Complication of anesthesia    bad pain with anesthesia induction with colonscopy 6 yrs ago  . Diabetes mellitus without complication ([place])  type 2  . GERD (gastroesophageal reflux disease)   . Hyperlipidemia   . Hypertension   . Peripheral vascular disease ([place])    partial clogged artery right leg  . Sleep apnea    yrs ago sleep study no cpap used    Past Surgical History:  Procedure Laterality Date  . ABDOMINAL AORTOGRAM W/LOWER EXTREMITY N/A 12/27/2016   Procedure: Abdominal Aortogram w/Lower Extremity;  Surgeon: Angelia Mould, MD;  Location: [place] [facility];  Service: Cardiovascular;  Laterality: N/A;  . CARDIAC SURGERY    . CORONARY ANGIOPLASTY WITH STENT PLACEMENT  11/2004   in [place]  . MENISCUS REPAIR Right   . NASAL SINUS SURGERY  1976  . TONSILLECTOMY      Social  History   Tobacco Use  . Smoking status: Former Smoker    Packs/day: 1.50    Years: 26.00    Pack years: 39.00    Types: Cigarettes    Quit date: 1994    Years since quitting: 27.2  . Smokeless tobacco: Former Network engineer Use Topics  . Alcohol use: Yes    Alcohol/week: 0.0 standard drinks    Comment: 3 ounces on monday  . Drug use: No    Family History  Problem Relation Age of Onset  . Alcohol abuse Brother   . Cancer Brother   . Diabetes Brother   . Hyperlipidemia Brother   . Hypertension Brother   . Diabetes Mother   . Hypertension Mother   . Hyperlipidemia Mother   . Heart attack Mother   . Hyperlipidemia Father   . Hypertension Father     Allergies  Allergen Reactions  . Dexilant [Dexlansoprazole]     Pressure in stomach and chest    Medication list has been reviewed and updated.  Current Outpatient Medications on File Prior to Visit  Medication Sig Dispense Refill  . amLODipine (NORVASC) 10 MG tablet TAKE 1 TABLET BY MOUTH EVERY DAY 90 tablet 3  . aspirin 81 MG tablet Take 81 mg by mouth daily.    Marland Kitchen atorvastatin (LIPITOR) 80 MG tablet TAKE 1 TABLET BY MOUTH EVERY DAY 90 tablet 3  . Blood Glucose Monitoring Suppl (FREESTYLE LITE) DEVI CHECK BLOOD SUGAR ONCE DAILY  0  . Blood Glucose Monitoring Suppl (FREESTYLE LITE) DEVI CHECK BLOOD SUGAR ONCE DAILY 1 each 0  . busPIRone (BUSPAR) 15 MG tablet Take 1 tablet (15 mg total) by mouth 2 (two) times daily. (Patient taking differently: Take 10 mg by mouth 2 (two) times daily. ) 180 tablet 3  . carvedilol (COREG) 25 MG tablet Take 1 tablet (25 mg total) by mouth 2 (two) times daily. 180 tablet 3  . Cholecalciferol (VITAMIN D3) 2000 units TABS Take 2,000 Units by mouth daily.    . cloNIDine (CATAPRES) 0.2 MG tablet Take 1 tablet (0.2 mg total) by mouth 2 (two) times daily. 180 tablet 3  . Coenzyme Q10 300 MG CAPS Take 300 mg by mouth daily.     . Cyanocobalamin (VITAMIN B-12 PO) Take 2,500 mg by mouth daily.    .  cyclobenzaprine (FLEXERIL) 10 MG tablet Take 1 tablet (10 mg total) by mouth 2 (two) times daily as needed for muscle spasms. 30 tablet 0  . glucose blood (FREESTYLE LITE) test strip Check blood sugar 1-3x daily 100 each 12  . glucose blood (FREESTYLE LITE) test strip CHECK BLOOD SUGAR ONCE DAILY 100 each 2  . Lancets (FREESTYLE) lancets Check blood sugar once daily 100 each  12  . lisinopril (ZESTRIL) 40 MG tablet TAKE 1 TABLET BY MOUTH EVERY DAY 90 tablet 3  . meloxicam (MOBIC) 15 MG tablet Take 15 mg by mouth daily.    . metFORMIN (GLUCOPHAGE) 1000 MG tablet TAKE 1 TABLET (1,000 MG TOTAL) BY MOUTH 2 (TWO) TIMES DAILY WITH A MEAL. 180 tablet 3  . MULTIPLE VITAMIN PO Take 1 tablet by mouth daily.     . niacin 500 MG tablet Take 500 mg by mouth every morning.     . Omega-3 Fatty Acids (EQL OMEGA 3 FISH OIL) 1000 MG CAPS Take 1 capsule by mouth 2 (two) times daily.    [name] VERIO test strip USE TO TEST BLOOD SUGAR 1-3 TIMES DAILY 100 strip 3  . pantoprazole (PROTONIX) 40 MG tablet TAKE 1 TABLET BY MOUTH EVERY DAY 90 tablet 1  . prednisoLONE acetate (PRED FORTE) 1 % ophthalmic suspension INSTILL 1 DROP INTO LEFT EYE TWICE A DAY    . Probiotic Product (PROBIOTIC ADVANCED PO) Take by mouth every evening. [employer]co brand takes 2 tabs    . UNABLE TO FIND Take 500 mcg by mouth daily. Med Name: Vitamin K2    . UNABLE TO FIND Med Name: Natural MK-7-100 MCG WITH MK-4-100MG      No current facility-administered medications on file prior to visit.    Review of Systems:  As per HPI- otherwise negative.   Physical Examination: There were no vitals filed for this visit. There were no vitals filed for this visit. There is no height or weight on file to calculate BMI. Ideal Body Weight:    His BP machine broke today-he plans to get a new 1 Patient observed over video monitoring today.  He looks well, no cough, wheezing, shortness of breath or distress is noted   Assessment and Plan: Type 2  diabetes mellitus without complication, without long-term current use of insulin (HCC)  Essential hypertension  Dyslipidemia  PAD (peripheral artery disease) (HCC)  GAD (generalized anxiety disorder)  Virtual visit today to discuss several symptoms which have been present for several months.  He does have complaint of chest tightness, but he has been seen in the ER and by his cardiologist for this recently and cleared.  He does note a concerning hypoglycemic episode.  I advised him that I do not think Metformin is causing all his symptoms, but it certainly could contribute to diarrhea.  His A1c has been quite satisfactory, we can try decreasing his Metformin and perhaps add a second diabetic agent.  I will have him decrease his Metformin by 50%, and schedule him an appointment to see me in about 10 days  In the meantime he will check his sugar once a day, different times a day, and keep a log.  He will seek immediate care if not doing okay in the meantime  This visit occurred during the SARS-CoV-2 public health emergency.  Safety protocols were in place, including screening questions prior to the visit, additional usage of staff PPE, and extensive cleaning of exam room while observing appropriate contact time as indicated for disinfecting solutions.    Signed Lamar Blinks, MD

## 2020-01-24 DIAGNOSIS — I10 Essential (primary) hypertension: Secondary | ICD-10-CM

## 2020-01-24 MED ORDER — SPIRONOLACTONE 25 MG PO TABS: 25.0000 mg | ORAL_TABLET | Freq: Every day | ORAL | 2 refills | Status: DC

## 2020-01-28 NOTE — Progress Notes (Addendum)
[place] [facility], [place], [place] [address] [phone]  Date:  01/30/2020   Name:  David Singh   DOB:  09-15-1951   MRN:  [id number]  PCP:  Darreld Mclean, MD    Chief Complaint: Diabetes (blood sugar fluctuating)   History of Present Illness:  David Singh is a 69 y.o. very pleasant male patient who presents with the following:  Patient with history of CAD, PAD, hypertension, diabetes, hyperlipidemia Here today for a diabetes follow-up  Last seen by myself for virtual visit in March Was having problems with hypoglycemia, we decided to decrease his Metformin by 50% and I asked him to keep blood sugar log which she has done We also have referred him to see Dr. Posey Pronto, endocrinologist at [facility]  He saw his cardiologist, Dr. Stanford Breed, in February His vascular surgeon is Dr. Beatris Ship with [facility] Recent BMP and CBC on chart Lab Results  Component Value Date   HGBA1C 6.9 (H) 09/05/2019  Due for lipids Due for PSA-?  Urology. He had been seeing Dr Felipa Eth but might like to seek a 2nd opinion as his surgery got canceled twice.  He would like to have a procedure called "urolift"   Amlodipine Aspirin 81 Lipitor 80 BuSpar Carvedilol Clonidine- stopped per [place] Lisinopril 40 Metformin 1000 twice daily-dose recently decreased to 1000 mg ONCE daily  Niacin Spironolactone 25- this was added per DR C as well- he would like [place] to check a BMP today for potassium level  Recent home BP 128/84, 147/79, 136/75 He is not having any low glucose readings- he brings me a log of recent glucose, 105- 176.  None out of acceptable range  His claudication continues to be a huge issue for him He can walk about 50 yards prior to having severe pain No significant change year  BP Readings from Last 3 Encounters:  01/30/20 138/90  11/29/19 (!) 170/100  11/10/19 133/80     Patient  Active Problem List   Diagnosis Date Noted  . Chronic gout 03/09/2016  . Hypertriglyceridemia 01/16/2015  . Type 2 diabetes mellitus ([place]) 01/06/2015  . CAD in native artery 01/06/2015  . History of coronary artery stent placement 01/06/2015  . PAD (peripheral artery disease) ([place]) 01/06/2015  . Hyperlipidemia 01/06/2015  . Essential hypertension 01/06/2015    Past Medical History:  Diagnosis Date  . CAD (coronary artery disease)   . Cancer ([place])    basal cell excision upper right ear 7 yrs ago  . Complication of anesthesia    bad pain with anesthesia induction with colonscopy 6 yrs ago  . Diabetes mellitus without complication ([place])    type 2  . GERD (gastroesophageal reflux disease)   . Hyperlipidemia   . Hypertension   . Peripheral vascular disease ([place])    partial clogged artery right leg  . Sleep apnea    yrs ago sleep study no cpap used    Past Surgical History:  Procedure Laterality Date  . ABDOMINAL AORTOGRAM W/LOWER EXTREMITY N/A 12/27/2016   Procedure: Abdominal Aortogram w/Lower Extremity;  Surgeon: Angelia Mould, MD;  Location: [facility];  Service: Cardiovascular;  Laterality: N/A;  . CARDIAC SURGERY    . CORONARY ANGIOPLASTY WITH STENT PLACEMENT  11/2004   in [place]  . MENISCUS REPAIR Right   . NASAL SINUS SURGERY  1976  . TONSILLECTOMY      Social  History   Tobacco Use  . Smoking status: Former Smoker    Packs/day: 1.50    Years: 26.00    Pack years: 39.00    Types: Cigarettes    Quit date: 1994    Years since quitting: 27.2  . Smokeless tobacco: Former Network engineer Use Topics  . Alcohol use: Yes    Alcohol/week: 0.0 standard drinks    Comment: 3 ounces on monday  . Drug use: No    Family History  Problem Relation Age of Onset  . Alcohol abuse Brother   . Cancer Brother   . Diabetes Brother   . Hyperlipidemia Brother   . Hypertension Brother   . Diabetes Mother   . Hypertension Mother   . Hyperlipidemia Mother   .  Heart attack Mother   . Hyperlipidemia Father   . Hypertension Father     Allergies  Allergen Reactions  . Dexilant [Dexlansoprazole]     Pressure in stomach and chest    Medication list has been reviewed and updated.  Current Outpatient Medications on File Prior to Visit  Medication Sig Dispense Refill  . amLODipine (NORVASC) 10 MG tablet TAKE 1 TABLET BY MOUTH EVERY DAY 90 tablet 3  . aspirin 81 MG tablet Take 81 mg by mouth daily.    Marland Kitchen atorvastatin (LIPITOR) 80 MG tablet TAKE 1 TABLET BY MOUTH EVERY DAY 90 tablet 3  . Blood Glucose Monitoring Suppl (FREESTYLE LITE) DEVI CHECK BLOOD SUGAR ONCE DAILY  0  . Blood Glucose Monitoring Suppl (FREESTYLE LITE) DEVI CHECK BLOOD SUGAR ONCE DAILY 1 each 0  . busPIRone (BUSPAR) 15 MG tablet Take 1 tablet (15 mg total) by mouth 2 (two) times daily. (Patient taking differently: Take 10 mg by mouth 2 (two) times daily. ) 180 tablet 3  . carvedilol (COREG) 25 MG tablet Take 1 tablet (25 mg total) by mouth 2 (two) times daily. 180 tablet 3  . Cholecalciferol (VITAMIN D3) 2000 units TABS Take 2,000 Units by mouth daily.    . cloNIDine (CATAPRES) 0.2 MG tablet Take 1 tablet (0.2 mg total) by mouth 2 (two) times daily. 180 tablet 3  . Coenzyme Q10 300 MG CAPS Take 300 mg by mouth daily.     . Cyanocobalamin (VITAMIN B-12 PO) Take 2,500 mg by mouth daily.    Marland Kitchen glucose blood (FREESTYLE LITE) test strip Check blood sugar 1-3x daily 100 each 12  . glucose blood (FREESTYLE LITE) test strip CHECK BLOOD SUGAR ONCE DAILY 100 each 2  . Lancets (FREESTYLE) lancets Check blood sugar once daily 100 each 12  . lisinopril (ZESTRIL) 40 MG tablet TAKE 1 TABLET BY MOUTH EVERY DAY 90 tablet 3  . meloxicam (MOBIC) 15 MG tablet Take 15 mg by mouth daily.    . metFORMIN (GLUCOPHAGE) 1000 MG tablet TAKE 1 TABLET (1,000 MG TOTAL) BY MOUTH 2 (TWO) TIMES DAILY WITH A MEAL. 180 tablet 3  . MULTIPLE VITAMIN PO Take 1 tablet by mouth daily.     . niacin 500 MG tablet Take 500 mg  by mouth every morning.     . Omega-3 Fatty Acids (EQL OMEGA 3 FISH OIL) 1000 MG CAPS Take 1 capsule by mouth 2 (two) times daily.    [name] VERIO test strip USE TO TEST BLOOD SUGAR 1-3 TIMES DAILY 100 strip 3  . pantoprazole (PROTONIX) 40 MG tablet TAKE 1 TABLET BY MOUTH EVERY DAY 90 tablet 1  . prednisoLONE acetate (PRED FORTE) 1 % ophthalmic suspension  INSTILL 1 DROP INTO LEFT EYE TWICE A DAY    . Probiotic Product (PROBIOTIC ADVANCED PO) Take by mouth every evening. [employer] brand takes 2 tabs    . spironolactone (ALDACTONE) 25 MG tablet Take 1 tablet (25 mg total) by mouth daily. 90 tablet 2  . UNABLE TO FIND Take 500 mcg by mouth daily. Med Name: Vitamin K2    . UNABLE TO FIND Med Name: Natural MK-7-100 MCG WITH MK-4-100MG      No current facility-administered medications on file prior to visit.    Review of Systems:  As per HPI- otherwise negative.   Physical Examination: Vitals:   01/30/20 1056 01/30/20 1113  BP: (!) 142/89 138/90  Pulse: 68   Resp: 16   Temp: 97.8 F (36.6 C)   SpO2: 97%    Vitals:   01/30/20 1056  Weight: 285 lb (129.3 kg)  Height: 5\' 9"  (1.753 m)   Body mass index is 42.09 kg/m. Ideal Body Weight: Weight in (lb) to have BMI = 25: 168.9  GEN: no acute distress.  Obese, otherwise looks well HEENT: Atraumatic, Normocephalic.  Ears and Nose: No external deformity. CV: RRR, No M/G/R. No JVD. No thrill. No extra heart sounds. PULM: CTA B, no wheezes, crackles, rhonchi. No retractions. No resp. distress. No accessory muscle use. EXTR: No c/c/e PSYCH: Normally interactive. Conversant.    Assessment and Plan: Type 2 diabetes mellitus without complication, without long-term current use of insulin ([place]) - Plan: Hemoglobin A1c, Comprehensive metabolic panel  Essential hypertension - Plan: Comprehensive metabolic panel, CANCELED: Basic metabolic panel  Dyslipidemia - Plan: Lipid panel, Comprehensive metabolic panel  PAD (peripheral artery disease)  ([facility])  Screening for prostate cancer - Plan: PSA, Ambulatory referral to Urology  Following up today for chronic illness recheck Check A1c today to determine effect of current Metformin dose, but expect things to look fine Blood pressures under adequate control with current regimen, clonidine was stopped and spironolactone added.  Check BMP today, results copy to Dr. Stanford Breed PAD is stable, he continues to be as active as he can PSA ordered, referral for second urology opinion Will plan further follow- up pending labs. Moderate medical decision making today This visit occurred during the SARS-CoV-2 public health emergency.  Safety protocols were in place, including screening questions prior to the visit, additional usage of staff PPE, and extensive cleaning of exam room while observing appropriate contact time as indicated for disinfecting solutions.    Signed Lamar Blinks, MD  Received his labs as below, message to patient Will also copy Dr. Stanford Breed  Results for orders placed or performed in visit on 01/30/20  Hemoglobin A1c  Result Value Ref Range   Hgb A1c MFr Bld 7.1 (H) 4.6 - 6.5 %  Lipid panel  Result Value Ref Range   Cholesterol 149 0 - 200 mg/dL   Triglycerides 336.0 (H) 0.0 - 149.0 mg/dL   HDL 32.60 (L) >39.00 mg/dL   VLDL 67.2 (H) 0.0 - 40.0 mg/dL   Total CHOL/HDL Ratio 5    NonHDL 116.46   PSA  Result Value Ref Range   PSA 2.20 0.10 - 4.00 ng/mL  Comprehensive metabolic panel  Result Value Ref Range   Sodium 135 135 - 145 mEq/L   Potassium 4.6 3.5 - 5.1 mEq/L   Chloride 100 96 - 112 mEq/L   CO2 28 19 - 32 mEq/L   Glucose, Bld 128 (H) 70 - 99 mg/dL   BUN 12 6 - 23 mg/dL  Creatinine, Ser 0.96 0.40 - 1.50 mg/dL   Total Bilirubin 0.5 0.2 - 1.2 mg/dL   Alkaline Phosphatase 51 39 - 117 U/L   AST 21 0 - 37 U/L   ALT 30 0 - 53 U/L   Total Protein 6.7 6.0 - 8.3 g/dL   Albumin 4.4 3.5 - 5.2 g/dL   GFR 77.87 >60.00 mL/min   Calcium 9.7 8.4 - 10.5 mg/dL  LDL  cholesterol, direct  Result Value Ref Range   Direct LDL 90.0 mg/dL

## 2020-01-29 ENCOUNTER — Other Ambulatory Visit: Payer: Self-pay

## 2020-01-30 ENCOUNTER — Encounter: Payer: Self-pay | Admitting: Family Medicine

## 2020-01-30 ENCOUNTER — Other Ambulatory Visit: Payer: Self-pay

## 2020-01-30 ENCOUNTER — Ambulatory Visit (INDEPENDENT_AMBULATORY_CARE_PROVIDER_SITE_OTHER): Payer: Medicare HMO | Admitting: Family Medicine

## 2020-01-30 VITALS — BP 138/90 | HR 68 | Temp 97.8°F | Resp 16 | Ht 69.0 in | Wt 285.0 lb

## 2020-01-30 DIAGNOSIS — Z125 Encounter for screening for malignant neoplasm of prostate: Secondary | ICD-10-CM

## 2020-01-30 DIAGNOSIS — E785 Hyperlipidemia, unspecified: Secondary | ICD-10-CM | POA: Diagnosis not present

## 2020-01-30 DIAGNOSIS — I10 Essential (primary) hypertension: Secondary | ICD-10-CM | POA: Diagnosis not present

## 2020-01-30 DIAGNOSIS — E119 Type 2 diabetes mellitus without complications: Secondary | ICD-10-CM | POA: Diagnosis not present

## 2020-01-30 DIAGNOSIS — I739 Peripheral vascular disease, unspecified: Secondary | ICD-10-CM | POA: Diagnosis not present

## 2020-01-30 LAB — COMPREHENSIVE METABOLIC PANEL
ALT: 30 U/L (ref 0–53)
AST: 21 U/L (ref 0–37)
Albumin: 4.4 g/dL (ref 3.5–5.2)
Alkaline Phosphatase: 51 U/L (ref 39–117)
BUN: 12 mg/dL (ref 6–23)
CO2: 28 mEq/L (ref 19–32)
Calcium: 9.7 mg/dL (ref 8.4–10.5)
Chloride: 100 mEq/L (ref 96–112)
Creatinine, Ser: 0.96 mg/dL (ref 0.40–1.50)
GFR: 77.87 mL/min (ref >60.00)
Glucose, Bld: 128 mg/dL — ABNORMAL HIGH (ref 70–99)
Potassium: 4.6 mEq/L (ref 3.5–5.1)
Sodium: 135 mEq/L (ref 135–145)
Total Bilirubin: 0.5 mg/dL (ref 0.2–1.2)
Total Protein: 6.7 g/dL (ref 6.0–8.3)

## 2020-01-30 LAB — LDL CHOLESTEROL, DIRECT: Direct LDL: 90 mg/dL

## 2020-01-30 LAB — LIPID PANEL
Cholesterol: 149 mg/dL (ref 0–200)
HDL: 32.6 mg/dL — ABNORMAL LOW (ref >39.00)
NonHDL: 116.46
Total CHOL/HDL Ratio: 5
Triglycerides: 336 mg/dL — ABNORMAL HIGH (ref 0.0–149.0)
VLDL: 67.2 mg/dL — ABNORMAL HIGH (ref 0.0–40.0)

## 2020-01-30 LAB — HEMOGLOBIN A1C: Hgb A1c MFr Bld: 7.1 % — ABNORMAL HIGH (ref 4.6–6.5)

## 2020-01-30 LAB — PSA: PSA: 2.2 ng/mL (ref 0.10–4.00)

## 2020-01-30 NOTE — Patient Instructions (Addendum)
It was good to see you again today!  I will be in touch with your labs asap We did a referral to [facility] urology in [place]- please give them a call and make sure they do the procedure that you need   Address: [address], [place], [place] [address] Phone: [phone]  Your BP looks ok

## 2020-02-16 NOTE — Patient Instructions (Addendum)
It was great to see you again today!    Please be sure to go over your recent PSA with Dr Felipa Eth Please try taking 1.5 lisinopril (60mg ) for 7- 10 days and let me know how your BP responds   I would really encourage you to get the covid 19 vaccine prior to being around crowds of people- you are at high risk for serious or deadly complications if you were to get covid 19  I ordered an ultrasound of your abdominal aorta to screen for aneurysm.  If normal this is a one time screening    Health Maintenance After Age 50 After age 45, you are at a higher risk for certain long-term diseases and infections as well as injuries from falls. Falls are a major cause of broken bones and head injuries in people who are older than age 44. Getting regular preventive care can help to keep you healthy and well. Preventive care includes getting regular testing and making lifestyle changes as recommended by your health care provider. Talk with your health care provider about:  Which screenings and tests you should have. A screening is a test that checks for a disease when you have no symptoms.  A diet and exercise plan that is right for you. What should I know about screenings and tests to prevent falls? Screening and testing are the best ways to find a health problem early. Early diagnosis and treatment give you the best chance of managing medical conditions that are common after age 34. Certain conditions and lifestyle choices may make you more likely to have a fall. Your health care provider may recommend:  Regular vision checks. Poor vision and conditions such as cataracts can make you more likely to have a fall. If you wear glasses, make sure to get your prescription updated if your vision changes.  Medicine review. Work with your health care provider to regularly review all of the medicines you are taking, including over-the-counter medicines. Ask your health care provider about any side effects that may  make you more likely to have a fall. Tell your health care provider if any medicines that you take make you feel dizzy or sleepy.  Osteoporosis screening. Osteoporosis is a condition that causes the bones to get weaker. This can make the bones weak and cause them to break more easily.  Blood pressure screening. Blood pressure changes and medicines to control blood pressure can make you feel dizzy.  Strength and balance checks. Your health care provider may recommend certain tests to check your strength and balance while standing, walking, or changing positions.  Foot health exam. Foot pain and numbness, as well as not wearing proper footwear, can make you more likely to have a fall.  Depression screening. You may be more likely to have a fall if you have a fear of falling, feel emotionally low, or feel unable to do activities that you used to do.  Alcohol use screening. Using too much alcohol can affect your balance and may make you more likely to have a fall. What actions can I take to lower my risk of falls? General instructions  Talk with your health care provider about your risks for falling. Tell your health care provider if: ? You fall. Be sure to tell your health care provider about all falls, even ones that seem minor. ? You feel dizzy, sleepy, or off-balance.  Take over-the-counter and prescription medicines only as told by your health care provider. These include any supplements.  Eat a healthy diet and maintain a healthy weight. A healthy diet includes low-fat dairy products, low-fat (lean) meats, and fiber from whole grains, beans, and lots of fruits and vegetables. Home safety  Remove any tripping hazards, such as rugs, cords, and clutter.  Install safety equipment such as grab bars in bathrooms and safety rails on stairs.  Keep rooms and walkways well-lit. Activity   Follow a regular exercise program to stay fit. This will help you maintain your balance. Ask your health  care provider what types of exercise are appropriate for you.  If you need a cane or walker, use it as recommended by your health care provider.  Wear supportive shoes that have nonskid soles. Lifestyle  Do not drink alcohol if your health care provider tells you not to drink.  If you drink alcohol, limit how much you have: ? 0-1 drink a day for women. ? 0-2 drinks a day for men.  Be aware of how much alcohol is in your drink. In the [place] one drink equals one typical bottle of beer (12 oz), one-half glass of wine (5 oz), or one shot of hard liquor (1 oz).  Do not use any products that contain nicotine or tobacco, such as cigarettes and e-cigarettes. If you need help quitting, ask your health care provider. Summary  Having a healthy lifestyle and getting preventive care can help to protect your health and wellness after age 9.  Screening and testing are the best way to find a health problem early and help you avoid having a fall. Early diagnosis and treatment give you the best chance for managing medical conditions that are more common for people who are older than age 48.  Falls are a major cause of broken bones and head injuries in people who are older than age 51. Take precautions to prevent a fall at home.  Work with your health care provider to learn what changes you can make to improve your health and wellness and to prevent falls. This information is not intended to replace advice given to you by your health care provider. Make sure you discuss any questions you have with your health care provider. Document Revised: 02/01/2019 Document Reviewed: 08/24/2017 Elsevier Patient Education  2020 [employer].

## 2020-02-16 NOTE — Progress Notes (Signed)
[facility] [address] [place], [place] [address] [phone]  Date:  02/18/2020   Name:  Trentan Malhi   DOB:  10-May-1951   MRN:  [id number]  PCP:  Darreld Mclean, MD    Chief Complaint: Annual Exam   History of Present Illness:  David Singh is a 69 y.o. very pleasant male patient who presents with the following:  Here today for an annual physical Patient has history of diabetes, CAD with stent, hyperlipidemia, hypertension, significant peripheral arterial disease I last saw him earlier this month.  At that point he was having some issues with urinary continence, we sent him for a second opinion with urology concerning possible surgical treatment He has an appt to see [place] again next week to repeat PSA-recent PSA was elevated  His recent labs showed okay control of diabetes I suggested an omega-3 supplement with recent lab results  COVID-19 vaccine- not done yet, he is not currently interested.  We discussed this today and I encouraged him to get this vaccine, especially as he plans to visit some theme parks later this summer Eye exam- he is UTD Foot exam is due  We checked an A1c earlier this month   Dr Stanford Breed started him on sprior earlier this year per his report He would like to see ortho regarding right knee pain- he has noted this for several years, gradually getting worse He had a meniscal tear repair done per ortho in approx 2006- this was done in [place] He has some clicking and popping, and right knee pain especially with walking  He would a HD sticker for his car to use for parking lots, this is okay with me  Patient Active Problem List   Diagnosis Date Noted  . Chronic gout 03/09/2016  . Hypertriglyceridemia 01/16/2015  . Type 2 diabetes mellitus ([place]) 01/06/2015  . CAD in native artery 01/06/2015  . History of coronary artery stent placement 01/06/2015  . PAD  (peripheral artery disease) ([place]) 01/06/2015  . Hyperlipidemia 01/06/2015  . Essential hypertension 01/06/2015    Past Medical History:  Diagnosis Date  . CAD (coronary artery disease)   . Cancer ([place])    basal cell excision upper right ear 7 yrs ago  . Complication of anesthesia    bad pain with anesthesia induction with colonscopy 6 yrs ago  . Diabetes mellitus without complication ([place])    type 2  . GERD (gastroesophageal reflux disease)   . Hyperlipidemia   . Hypertension   . Peripheral vascular disease ([place])    partial clogged artery right leg  . Sleep apnea    yrs ago sleep study no cpap used    Past Surgical History:  Procedure Laterality Date  . ABDOMINAL AORTOGRAM W/LOWER EXTREMITY N/A 12/27/2016   Procedure: Abdominal Aortogram w/Lower Extremity;  Surgeon: Angelia Mould, MD;  Location: [facility];  Service: Cardiovascular;  Laterality: N/A;  . CARDIAC SURGERY    . CORONARY ANGIOPLASTY WITH STENT PLACEMENT  11/2004   in [place]  . MENISCUS REPAIR Right   . NASAL SINUS SURGERY  1976  . TONSILLECTOMY      Social History   Tobacco Use  . Smoking status: Former Smoker    Packs/day: 1.50    Years: 26.00    Pack years: 39.00    Types: Cigarettes    Quit date: 1994    Years since quitting: 27.3  .  Smokeless tobacco: Former Network engineer Use Topics  . Alcohol use: Yes    Alcohol/week: 0.0 standard drinks    Comment: 3 ounces on monday  . Drug use: No    Family History  Problem Relation Age of Onset  . Alcohol abuse Brother   . Cancer Brother   . Diabetes Brother   . Hyperlipidemia Brother   . Hypertension Brother   . Diabetes Mother   . Hypertension Mother   . Hyperlipidemia Mother   . Heart attack Mother   . Hyperlipidemia Father   . Hypertension Father     Allergies  Allergen Reactions  . Dexilant [Dexlansoprazole]     Pressure in stomach and chest    Medication list has been reviewed and updated.  Current Outpatient  Medications on File Prior to Visit  Medication Sig Dispense Refill  . amLODipine (NORVASC) 10 MG tablet TAKE 1 TABLET BY MOUTH EVERY DAY 90 tablet 3  . aspirin 81 MG tablet Take 81 mg by mouth daily.    Marland Kitchen atorvastatin (LIPITOR) 80 MG tablet TAKE 1 TABLET BY MOUTH EVERY DAY 90 tablet 3  . Blood Glucose Monitoring Suppl (FREESTYLE LITE) DEVI CHECK BLOOD SUGAR ONCE DAILY  0  . Blood Glucose Monitoring Suppl (FREESTYLE LITE) DEVI CHECK BLOOD SUGAR ONCE DAILY 1 each 0  . busPIRone (BUSPAR) 15 MG tablet Take 1 tablet (15 mg total) by mouth 2 (two) times daily. (Patient taking differently: Take 10 mg by mouth 2 (two) times daily. ) 180 tablet 3  . carvedilol (COREG) 25 MG tablet Take 1 tablet (25 mg total) by mouth 2 (two) times daily. 180 tablet 3  . Cholecalciferol (VITAMIN D3) 2000 units TABS Take 2,000 Units by mouth daily.    . cloNIDine (CATAPRES) 0.2 MG tablet Take 1 tablet (0.2 mg total) by mouth 2 (two) times daily. 180 tablet 3  . Coenzyme Q10 300 MG CAPS Take 300 mg by mouth daily.     . Cyanocobalamin (VITAMIN B-12 PO) Take 2,500 mg by mouth daily.    Marland Kitchen glucose blood (FREESTYLE LITE) test strip Check blood sugar 1-3x daily 100 each 12  . glucose blood (FREESTYLE LITE) test strip CHECK BLOOD SUGAR ONCE DAILY 100 each 2  . Lancets (FREESTYLE) lancets Check blood sugar once daily 100 each 12  . lisinopril (ZESTRIL) 40 MG tablet TAKE 1 TABLET BY MOUTH EVERY DAY 90 tablet 3  . metFORMIN (GLUCOPHAGE) 1000 MG tablet TAKE 1 TABLET (1,000 MG TOTAL) BY MOUTH 2 (TWO) TIMES DAILY WITH A MEAL. 180 tablet 3  . MULTIPLE VITAMIN PO Take 1 tablet by mouth daily.     . niacin 500 MG tablet Take 500 mg by mouth every morning.     . Omega-3 Fatty Acids (EQL OMEGA 3 FISH OIL) 1000 MG CAPS Take 1 capsule by mouth 2 (two) times daily.    [name] VERIO test strip USE TO TEST BLOOD SUGAR 1-3 TIMES DAILY 100 strip 3  . pantoprazole (PROTONIX) 40 MG tablet TAKE 1 TABLET BY MOUTH EVERY DAY 90 tablet 1  .  prednisoLONE acetate (PRED FORTE) 1 % ophthalmic suspension INSTILL 1 DROP INTO LEFT EYE TWICE A DAY    . Probiotic Product (PROBIOTIC ADVANCED PO) Take by mouth every evening. [employer] brand takes 2 tabs    . spironolactone (ALDACTONE) 25 MG tablet Take 1 tablet (25 mg total) by mouth daily. 90 tablet 2  . UNABLE TO FIND Take 500 mcg by mouth daily. Med Name:  Vitamin K2    . UNABLE TO FIND Med Name: Natural MK-7-100 MCG WITH MK-4-100MG     . meloxicam (MOBIC) 15 MG tablet Take 15 mg by mouth daily.     No current facility-administered medications on file prior to visit.    Review of Systems:  As per HPI- otherwise negative.   Physical Examination: Vitals:   02/18/20 0824  BP: (!) 161/90  Pulse: 75  Resp: 17  Temp: 98.2 F (36.8 C)  SpO2: 97%   Vitals:   02/18/20 0824  Weight: 284 lb (128.8 kg)  Height: 5\' 9"  (1.753 m)   Body mass index is 41.94 kg/m. Ideal Body Weight: Weight in (lb) to have BMI = 25: 168.9  GEN: no acute distress.  Obese, otherwise looks well HEENT: Atraumatic, Normocephalic.  Ears and Nose: No external deformity. CV: RRR, No M/G/R. No JVD. No thrill. No extra heart sounds. PULM: CTA B, no wheezes, crackles, rhonchi. No retractions. No resp. distress. No accessory muscle use. ABD: S, NT, ND, +BS. No rebound. No HSM.  Stable umbilical hernia EXTR: No c/c/e PSYCH: Normally interactive. Conversant.  Foot exam: No pulses are palpable, consistent with known PAD.  Otherwise sensation is normal  Assessment and Plan: Physical exam  Chronic pain of right knee - Plan: Ambulatory referral to Orthopedic Surgery  Type 2 diabetes mellitus without complication, without long-term current use of insulin (HCC)  Essential hypertension  Dyslipidemia  PAD (peripheral artery disease) ([place])  Screening for prostate cancer  Former smoker - Plan: US AORTA MEDICARE SCREENING  Here today for a follow-up visit and physical exam Referral to orthopedics for knee  pain Recent lab work is done, none needed today We do not think he has had AAA screening since he turns 55, order this for him today He is seen urology already for follow-up  Blood pressure is high today, he brings a list of home blood pressure readings-several of these are also above goal We will have him increase lisinopril to 60 mg for 7 to 10 days, he will email me with his response  This visit occurred during the SARS-CoV-2 public health emergency.  Safety protocols were in place, including screening questions prior to the visit, additional usage of staff PPE, and extensive cleaning of exam room while observing appropriate contact time as indicated for disinfecting solutions.   Encourage exercise as his peripheral arterial disease allows, weight management, avoidance of tobacco and alcohol Signed Lamar Blinks, MD

## 2020-02-18 ENCOUNTER — Other Ambulatory Visit: Payer: Self-pay

## 2020-02-18 ENCOUNTER — Encounter: Payer: Self-pay | Admitting: Family Medicine

## 2020-02-18 ENCOUNTER — Ambulatory Visit (INDEPENDENT_AMBULATORY_CARE_PROVIDER_SITE_OTHER): Payer: Medicare HMO | Admitting: Family Medicine

## 2020-02-18 VITALS — BP 161/90 | HR 75 | Temp 98.2°F | Resp 17 | Ht 69.0 in | Wt 284.0 lb

## 2020-02-18 DIAGNOSIS — Z125 Encounter for screening for malignant neoplasm of prostate: Secondary | ICD-10-CM | POA: Diagnosis not present

## 2020-02-18 DIAGNOSIS — E119 Type 2 diabetes mellitus without complications: Secondary | ICD-10-CM | POA: Diagnosis not present

## 2020-02-18 DIAGNOSIS — E785 Hyperlipidemia, unspecified: Secondary | ICD-10-CM

## 2020-02-18 DIAGNOSIS — M25561 Pain in right knee: Secondary | ICD-10-CM | POA: Diagnosis not present

## 2020-02-18 DIAGNOSIS — Z0000 Encounter for general adult medical examination without abnormal findings: Secondary | ICD-10-CM

## 2020-02-18 DIAGNOSIS — G8929 Other chronic pain: Secondary | ICD-10-CM

## 2020-02-18 DIAGNOSIS — Z87891 Personal history of nicotine dependence: Secondary | ICD-10-CM

## 2020-02-18 DIAGNOSIS — I739 Peripheral vascular disease, unspecified: Secondary | ICD-10-CM | POA: Diagnosis not present

## 2020-02-18 DIAGNOSIS — I10 Essential (primary) hypertension: Secondary | ICD-10-CM | POA: Diagnosis not present

## 2020-02-25 ENCOUNTER — Encounter: Payer: Self-pay | Admitting: Family Medicine

## 2020-02-25 NOTE — Telephone Encounter (Signed)
Placed new form in folder to be signed.

## 2020-03-03 ENCOUNTER — Encounter: Payer: Self-pay | Admitting: Family Medicine

## 2020-03-03 ENCOUNTER — Other Ambulatory Visit: Payer: Self-pay

## 2020-03-03 ENCOUNTER — Other Ambulatory Visit: Payer: Self-pay | Admitting: Family Medicine

## 2020-03-03 ENCOUNTER — Ambulatory Visit: Payer: Self-pay

## 2020-03-03 ENCOUNTER — Ambulatory Visit: Payer: Medicare HMO | Admitting: Family Medicine

## 2020-03-03 VITALS — BP 166/89 | HR 81 | Ht 69.0 in | Wt 280.0 lb

## 2020-03-03 DIAGNOSIS — M1711 Unilateral primary osteoarthritis, right knee: Secondary | ICD-10-CM

## 2020-03-03 DIAGNOSIS — I10 Essential (primary) hypertension: Secondary | ICD-10-CM

## 2020-03-03 DIAGNOSIS — M25561 Pain in right knee: Secondary | ICD-10-CM

## 2020-03-03 MED ORDER — PENNSAID 2 % EX SOLN: 1.0000 "application " | Freq: Two times a day (BID) | CUTANEOUS | 3 refills | Status: DC

## 2020-03-03 MED ORDER — LISINOPRIL 40 MG PO TABS: 60.0000 mg | ORAL_TABLET | Freq: Every day | ORAL | 3 refills | Status: DC

## 2020-03-03 NOTE — Progress Notes (Signed)
David Singh - 69 y.o. male MRN [id number]  Date of birth: 08-18-1951  SUBJECTIVE:  Including CC & ROS.  Chief Complaint  Patient presents with  . Knee Pain    right    David Singh is a 69 y.o. male that is presenting with acute right knee pain.  The pain is been ongoing for about 2 weeks.  Denies any specific inciting event.  It is localized to the medial aspect.  It comes and goes and is worse with his driving.  He has not done anything for the pain..  Independent review of the right knee x-ray from 2018 shows moderate medial joint space narrowing.   Review of Systems See HPI   HISTORY: Past Medical, Surgical, Social, and Family History Reviewed & Updated per EMR.   Pertinent Historical Findings include:  Past Medical History:  Diagnosis Date  . CAD (coronary artery disease)   . Cancer ([place])    basal cell excision upper right ear 7 yrs ago  . Complication of anesthesia    bad pain with anesthesia induction with colonscopy 6 yrs ago  . Diabetes mellitus without complication ([place])    type 2  . GERD (gastroesophageal reflux disease)   . Hyperlipidemia   . Hypertension   . Peripheral vascular disease ([place])    partial clogged artery right leg  . Sleep apnea    yrs ago sleep study no cpap used    Past Surgical History:  Procedure Laterality Date  . ABDOMINAL AORTOGRAM W/LOWER EXTREMITY N/A 12/27/2016   Procedure: Abdominal Aortogram w/Lower Extremity;  Surgeon: Angelia Mould, MD;  Location: [facility];  Service: Cardiovascular;  Laterality: N/A;  . CARDIAC SURGERY    . CORONARY ANGIOPLASTY WITH STENT PLACEMENT  11/2004   in [place]  . MENISCUS REPAIR Right   . NASAL SINUS SURGERY  1976  . TONSILLECTOMY      Family History  Problem Relation Age of Onset  . Alcohol abuse Brother   . Cancer Brother   . Diabetes Brother   . Hyperlipidemia Brother   . Hypertension Brother   . Diabetes Mother   . Hypertension Mother   .  Hyperlipidemia Mother   . Heart attack Mother   . Hyperlipidemia Father   . Hypertension Father     Social History   Socioeconomic History  . Marital status: Married    Spouse name: Not on file  . Number of children: 2  . Years of education: Not on file  . Highest education level: Not on file  Occupational History  . Not on file  Tobacco Use  . Smoking status: Former Smoker    Packs/day: 1.50    Years: 26.00    Pack years: 39.00    Types: Cigarettes    Quit date: 1994    Years since quitting: 27.3  . Smokeless tobacco: Former Network engineer and Sexual Activity  . Alcohol use: Yes    Alcohol/week: 0.0 standard drinks    Comment: 3 ounces on monday  . Drug use: No  . Sexual activity: Yes  Other Topics Concern  . Not on file  Social History Narrative  . Not on file   Social Determinants of Health   Financial Resource Strain:   . Difficulty of Paying Living Expenses:   Food Insecurity:   . Worried About Charity fundraiser in the Last Year:   . Arboriculturist in the Last Year:   Transportation Needs:   .  Lack of Transportation (Medical):   Marland Kitchen Lack of Transportation (Non-Medical):   Physical Activity:   . Days of Exercise per Week:   . Minutes of Exercise per Session:   Stress:   . Feeling of Stress :   Social Connections:   . Frequency of Communication with Friends and Family:   . Frequency of Social Gatherings with Friends and Family:   . Attends Religious Services:   . Active Member of Clubs or Organizations:   . Attends Archivist Meetings:   Marland Kitchen Marital Status:   Intimate Partner Violence:   . Fear of Current or Ex-Partner:   . Emotionally Abused:   Marland Kitchen Physically Abused:   . Sexually Abused:      PHYSICAL EXAM:  VS: BP (!) 166/89   Pulse 81   Ht 5\' 9"  (1.753 m)   Wt 280 lb (127 kg)   BMI 41.35 kg/m  Physical Exam Gen: NAD, alert, cooperative with exam, well-appearing MSK:  Right knee: No obvious effusion. Tenderness to palpation of  the medial joint line. Normal range of motion. Some instability with valgus and varus stress testing. Negative McMurray's test. No pain with patellar grind. Neurovascularly intact  Limited ultrasound: Right knee:  Moderate effusion within the suprapatellar pouch. Normal-appearing quadricep patellar tendon. Normal-appearing lateral meniscus. Moderate to severe joint space narrowing with degenerative changes with outpouching of the medial meniscus.  Summary: Findings suggest moderate to severe degenerative joint changes and degenerative meniscus changes.  Ultrasound and interpretation by Clearance Coots, MD    ASSESSMENT & PLAN:   Primary osteoarthritis of right knee Pain likely related degenerative changes observed.  Pain is intermittent and mild currently. -Counseled on home exercise therapy and supportive care. -OA reaction brace. -Pennsaid. -Could consider injection or physical therapy.

## 2020-03-03 NOTE — Patient Instructions (Signed)
Nice to meet you Please try ice  Please try the exercises  I have sent the pennsaid to your pharmacy. Please let me know if it's too expensive.   Please send me a message in MyChart with any questions or updates.  Please see me back in 4 weeks.   --Dr. Raeford Razor

## 2020-03-03 NOTE — Assessment & Plan Note (Signed)
Pain likely related degenerative changes observed.  Pain is intermittent and mild currently. -Counseled on home exercise therapy and supportive care. -OA reaction brace. -Pennsaid. -Could consider injection or physical therapy.

## 2020-03-05 ENCOUNTER — Encounter: Payer: Self-pay | Admitting: Family Medicine

## 2020-03-06 ENCOUNTER — Other Ambulatory Visit: Payer: Self-pay | Admitting: Family Medicine

## 2020-03-06 ENCOUNTER — Ambulatory Visit: Payer: Medicare HMO | Admitting: Family Medicine

## 2020-03-06 MED ORDER — DICLOFENAC SODIUM 1.5 % EX SOLN: 40.0000 [drp] | Freq: Four times a day (QID) | CUTANEOUS | 3 refills | Status: DC | PRN

## 2020-03-07 ENCOUNTER — Other Ambulatory Visit: Payer: Self-pay

## 2020-03-07 ENCOUNTER — Ambulatory Visit (HOSPITAL_BASED_OUTPATIENT_CLINIC_OR_DEPARTMENT_OTHER)
Admission: RE | Admit: 2020-03-07 | Discharge: 2020-03-07 | Disposition: A | Discharge status: Home / Self Care | Payer: Medicare HMO | Source: Ambulatory Visit | Attending: Family Medicine | Admitting: Family Medicine

## 2020-03-07 DIAGNOSIS — Z136 Encounter for screening for cardiovascular disorders: Secondary | ICD-10-CM | POA: Diagnosis not present

## 2020-03-07 DIAGNOSIS — Z87891 Personal history of nicotine dependence: Secondary | ICD-10-CM | POA: Diagnosis not present

## 2020-03-10 ENCOUNTER — Other Ambulatory Visit: Payer: Self-pay | Admitting: Family Medicine

## 2020-03-10 MED ORDER — DICLOFENAC SODIUM 1.5 % EX SOLN: 40.0000 [drp] | Freq: Four times a day (QID) | CUTANEOUS | 3 refills | Status: DC | PRN

## 2020-03-12 DIAGNOSIS — N401 Enlarged prostate with lower urinary tract symptoms: Secondary | ICD-10-CM | POA: Diagnosis not present

## 2020-03-12 DIAGNOSIS — N138 Other obstructive and reflux uropathy: Secondary | ICD-10-CM | POA: Diagnosis not present

## 2020-03-12 DIAGNOSIS — R972 Elevated prostate specific antigen [PSA]: Secondary | ICD-10-CM | POA: Diagnosis not present

## 2020-03-12 DIAGNOSIS — R339 Retention of urine, unspecified: Secondary | ICD-10-CM | POA: Diagnosis not present

## 2020-03-31 ENCOUNTER — Encounter: Payer: Self-pay | Admitting: Family Medicine

## 2020-03-31 ENCOUNTER — Other Ambulatory Visit: Payer: Self-pay

## 2020-03-31 ENCOUNTER — Ambulatory Visit: Payer: Medicare HMO | Admitting: Family Medicine

## 2020-03-31 DIAGNOSIS — M1711 Unilateral primary osteoarthritis, right knee: Secondary | ICD-10-CM

## 2020-03-31 MED ORDER — DICLOFENAC SODIUM 1.5 % EX SOLN: 40.0000 [drp] | Freq: Four times a day (QID) | CUTANEOUS | 3 refills | Status: DC | PRN

## 2020-03-31 NOTE — Assessment & Plan Note (Signed)
Intermittently ongoing.  He has had a history of surgery in the same knee.  The braces did not work when they were able to stay in place. -Counseled on exercise therapy and supportive care. -Printed topical solution prescription. -Discussed physical therapy.  He will call back if he wants to pursue this. -Would consider updated images and injection.

## 2020-03-31 NOTE — Progress Notes (Signed)
David Singh - 69 y.o. male MRN [id number]  Date of birth: September 19, 1951  SUBJECTIVE:  Including CC & ROS.  Chief Complaint  Patient presents with  . Follow-up    right knee    David Singh is a 69 y.o. male that is following up for his right knee pain.  The pain is intermittent in nature.  It seems worse at nighttime when he is riding in the car.  He did get improvement with the brace but it tended to fall down his leg.  His pain is aching.  He has tried over-the-counter topical cream.  He had a history of meniscectomy several years ago.   Review of Systems See HPI   HISTORY: Past Medical, Surgical, Social, and Family History Reviewed & Updated per EMR.   Pertinent Historical Findings include:  Past Medical History:  Diagnosis Date  . CAD (coronary artery disease)   . Cancer ([place])    basal cell excision upper right ear 7 yrs ago  . Complication of anesthesia    bad pain with anesthesia induction with colonscopy 6 yrs ago  . Diabetes mellitus without complication ([place])    type 2  . GERD (gastroesophageal reflux disease)   . Hyperlipidemia   . Hypertension   . Peripheral vascular disease ([place])    partial clogged artery right leg  . Sleep apnea    yrs ago sleep study no cpap used    Past Surgical History:  Procedure Laterality Date  . ABDOMINAL AORTOGRAM W/LOWER EXTREMITY N/A 12/27/2016   Procedure: Abdominal Aortogram w/Lower Extremity;  Surgeon: Angelia Mould, MD;  Location: [facility];  Service: Cardiovascular;  Laterality: N/A;  . CARDIAC SURGERY    . CORONARY ANGIOPLASTY WITH STENT PLACEMENT  11/2004   in [place]  . MENISCUS REPAIR Right   . NASAL SINUS SURGERY  1976  . TONSILLECTOMY      Family History  Problem Relation Age of Onset  . Alcohol abuse Brother   . Cancer Brother   . Diabetes Brother   . Hyperlipidemia Brother   . Hypertension Brother   . Diabetes Mother   . Hypertension Mother   . Hyperlipidemia Mother   .  Heart attack Mother   . Hyperlipidemia Father   . Hypertension Father     Social History   Socioeconomic History  . Marital status: Married    Spouse name: Not on file  . Number of children: 2  . Years of education: Not on file  . Highest education level: Not on file  Occupational History  . Not on file  Tobacco Use  . Smoking status: Former Smoker    Packs/day: 1.50    Years: 26.00    Pack years: 39.00    Types: Cigarettes    Quit date: 1994    Years since quitting: 27.4  . Smokeless tobacco: Former Network engineer and Sexual Activity  . Alcohol use: Yes    Alcohol/week: 0.0 standard drinks    Comment: 3 ounces on monday  . Drug use: No  . Sexual activity: Yes  Other Topics Concern  . Not on file  Social History Narrative  . Not on file   Social Determinants of Health   Financial Resource Strain:   . Difficulty of Paying Living Expenses:   Food Insecurity:   . Worried About Charity fundraiser in the Last Year:   . Arboriculturist in the Last Year:   Transportation Needs:   .  Lack of Transportation (Medical):   Marland Kitchen Lack of Transportation (Non-Medical):   Physical Activity:   . Days of Exercise per Week:   . Minutes of Exercise per Session:   Stress:   . Feeling of Stress :   Social Connections:   . Frequency of Communication with Friends and Family:   . Frequency of Social Gatherings with Friends and Family:   . Attends Religious Services:   . Active Member of Clubs or Organizations:   . Attends Archivist Meetings:   Marland Kitchen Marital Status:   Intimate Partner Violence:   . Fear of Current or Ex-Partner:   . Emotionally Abused:   Marland Kitchen Physically Abused:   . Sexually Abused:      PHYSICAL EXAM:  VS: BP 111/75   Pulse 76   Ht 5\' 9"  (1.753 m)   Wt 285 lb (129.3 kg)   BMI 42.09 kg/m  Physical Exam Gen: NAD, alert, cooperative with exam, well-appearing MSK:  Right knee: Mild effusion. No tenderness to palpation over the medial lateral joint  space. Normal range of motion. instability with valgus or varus stress testing. Neurovascularly intact     ASSESSMENT & PLAN:   Primary osteoarthritis of right knee Intermittently ongoing.  He has had a history of surgery in the same knee.  The braces did not work when they were able to stay in place. -Counseled on exercise therapy and supportive care. -Printed topical solution prescription. -Discussed physical therapy.  He will call back if he wants to pursue this. -Would consider updated images and injection.

## 2020-03-31 NOTE — Patient Instructions (Signed)
Good to see you Please try ice as needed  Please let me know about the prescription  Please let me know about physical therapy   Please send me a message in MyChart with any questions or updates.  Please see me back in 4-6 weeks.   --Dr. Raeford Razor

## 2020-04-01 ENCOUNTER — Encounter: Payer: Self-pay | Admitting: Family Medicine

## 2020-04-04 ENCOUNTER — Other Ambulatory Visit: Payer: Self-pay | Admitting: Family Medicine

## 2020-04-04 DIAGNOSIS — K219 Gastro-esophageal reflux disease without esophagitis: Secondary | ICD-10-CM

## 2020-04-08 ENCOUNTER — Telehealth: Payer: Self-pay | Admitting: Cardiology

## 2020-04-08 MED ORDER — SPIRONOLACTONE 25 MG PO TABS: 25.0000 mg | ORAL_TABLET | Freq: Every day | ORAL | 2 refills | Status: DC

## 2020-04-08 NOTE — Telephone Encounter (Signed)
Pt c/o medication issue:  1. Name of Medication: spironolactone (ALDACTONE) 25 MG tablet  2. How are you currently taking this medication (dosage and times per day)? As directed  3. Are you having a reaction (difficulty breathing--STAT)? no  4. What is your medication issue? Patient states that he went to get this medication refilled but he just got a text from his pharmacy stating that he needs an appointment. He saw Dr. Stanford Breed in February. Please advise.

## 2020-04-08 NOTE — Telephone Encounter (Signed)
Spoke with patient and explained that spironolactone refilled #90 with 2 refills was sent on 01/24/20 to [facility]. Patient claims pharmacy does not have this. Rx(s) sent to pharmacy electronically.

## 2020-04-15 ENCOUNTER — Encounter: Payer: Self-pay | Admitting: Family Medicine

## 2020-04-17 ENCOUNTER — Telehealth: Payer: Self-pay | Admitting: Family Medicine

## 2020-04-17 NOTE — Telephone Encounter (Signed)
Paperwork received awaiting signature.

## 2020-04-17 NOTE — Telephone Encounter (Signed)
Per Amy at [facility] will send an order to Dr Lorelei Pont. Per Amy, she would like faxed back today if possible

## 2020-05-05 DIAGNOSIS — E782 Mixed hyperlipidemia: Secondary | ICD-10-CM | POA: Diagnosis not present

## 2020-05-05 DIAGNOSIS — E1151 Type 2 diabetes mellitus with diabetic peripheral angiopathy without gangrene: Secondary | ICD-10-CM | POA: Diagnosis not present

## 2020-05-12 ENCOUNTER — Ambulatory Visit: Payer: Medicare HMO | Admitting: Family Medicine

## 2020-05-12 DIAGNOSIS — G8929 Other chronic pain: Secondary | ICD-10-CM | POA: Diagnosis not present

## 2020-05-12 DIAGNOSIS — Z6841 Body Mass Index (BMI) 40.0 and over, adult: Secondary | ICD-10-CM | POA: Diagnosis not present

## 2020-05-12 DIAGNOSIS — I70219 Atherosclerosis of native arteries of extremities with intermittent claudication, unspecified extremity: Secondary | ICD-10-CM | POA: Diagnosis not present

## 2020-05-12 DIAGNOSIS — E785 Hyperlipidemia, unspecified: Secondary | ICD-10-CM | POA: Diagnosis not present

## 2020-05-12 DIAGNOSIS — E1151 Type 2 diabetes mellitus with diabetic peripheral angiopathy without gangrene: Secondary | ICD-10-CM | POA: Diagnosis not present

## 2020-05-12 DIAGNOSIS — K219 Gastro-esophageal reflux disease without esophagitis: Secondary | ICD-10-CM | POA: Diagnosis not present

## 2020-05-12 DIAGNOSIS — I2510 Atherosclerotic heart disease of native coronary artery without angina pectoris: Secondary | ICD-10-CM | POA: Diagnosis not present

## 2020-05-12 DIAGNOSIS — R69 Illness, unspecified: Secondary | ICD-10-CM | POA: Diagnosis not present

## 2020-05-12 DIAGNOSIS — I10 Essential (primary) hypertension: Secondary | ICD-10-CM | POA: Diagnosis not present

## 2020-05-27 DIAGNOSIS — E119 Type 2 diabetes mellitus without complications: Secondary | ICD-10-CM | POA: Diagnosis not present

## 2020-05-27 DIAGNOSIS — H524 Presbyopia: Secondary | ICD-10-CM | POA: Diagnosis not present

## 2020-05-27 DIAGNOSIS — H52209 Unspecified astigmatism, unspecified eye: Secondary | ICD-10-CM | POA: Diagnosis not present

## 2020-05-27 DIAGNOSIS — H269 Unspecified cataract: Secondary | ICD-10-CM | POA: Diagnosis not present

## 2020-05-27 DIAGNOSIS — H5203 Hypermetropia, bilateral: Secondary | ICD-10-CM | POA: Diagnosis not present

## 2020-05-27 LAB — HM DIABETES EYE EXAM

## 2020-05-29 ENCOUNTER — Encounter: Payer: Self-pay | Admitting: *Deleted

## 2020-05-29 NOTE — Progress Notes (Signed)
I connected with David Singh today by telephone and verified that I am speaking with the correct person using two identifiers. Location patient: home Location provider: work Persons participating in the virtual visit: patient, Marine scientist.    I discussed the limitations, risks, security and privacy concerns of performing an evaluation and management service by telephone and the availability of in person appointments. I also discussed with the patient that there may be a patient responsible charge related to this service. The patient expressed understanding and verbally consented to this telephonic visit.    Interactive audio and video telecommunications were attempted between this provider and patient, however failed, due to patient having technical difficulties OR patient did not have access to video capability.  We continued and completed visit with audio only.  Some vital signs may be absent or patient reported.    Subjective:   David Singh is a 69 y.o. male who presents for Medicare Annual/Subsequent preventive examination.  Review of Systems    Cardiac Risk Factors include: advanced age (>70men, >41 women);diabetes mellitus;dyslipidemia;hypertension;male gender;obesity (BMI >30kg/m2)     Objective:    Today's Vitals   05/30/20 1014  BP: 103/67   There is no height or weight on file to calculate BMI.  Advanced Directives 05/30/2020 11/10/2019 12/28/2017 06/29/2017 12/27/2016 12/22/2016 08/18/2016  Does Patient Have a Medical Advance Directive? Yes No Yes Yes Yes Yes Yes  Type of Paramedic of [place];Living will - [place];Living will - [place];Living will Living will -  Does patient want to make changes to medical advance directive? No - Patient declined - - - No - Patient declined - -  Copy of [place] in Chart? No - copy requested - - - No - copy requested - -  Would patient like information on  creating a medical advance directive? - No - Patient declined - - - - -    Current Medications (verified) Outpatient Encounter Medications as of 05/30/2020  Medication Sig  . amLODipine (NORVASC) 10 MG tablet TAKE 1 TABLET BY MOUTH EVERY DAY  . aspirin 81 MG tablet Take 81 mg by mouth daily.  Marland Kitchen atorvastatin (LIPITOR) 80 MG tablet TAKE 1 TABLET BY MOUTH EVERY DAY  . Blood Glucose Monitoring Suppl (FREESTYLE LITE) DEVI CHECK BLOOD SUGAR ONCE DAILY  . Blood Glucose Monitoring Suppl (FREESTYLE LITE) DEVI CHECK BLOOD SUGAR ONCE DAILY  . busPIRone (BUSPAR) 15 MG tablet Take 1 tablet (15 mg total) by mouth 2 (two) times daily. (Patient taking differently: Take 10 mg by mouth 2 (two) times daily. )  . Cholecalciferol (VITAMIN D3) 2000 units TABS Take 2,000 Units by mouth daily.  . cloNIDine (CATAPRES) 0.2 MG tablet Take 1 tablet (0.2 mg total) by mouth 2 (two) times daily.  . Coenzyme Q10 300 MG CAPS Take 300 mg by mouth daily.   . Cyanocobalamin (VITAMIN B-12 PO) Take 2,500 mg by mouth daily.  . Diclofenac Sodium 1.5 % SOLN Apply 40 drops topically 4 (four) times daily as needed.  Marland Kitchen glucose blood (FREESTYLE LITE) test strip Check blood sugar 1-3x daily  . glucose blood (FREESTYLE LITE) test strip CHECK BLOOD SUGAR ONCE DAILY  . Lancets (FREESTYLE) lancets Check blood sugar once daily  . lisinopril (ZESTRIL) 40 MG tablet Take 1.5 tablets (60 mg total) by mouth daily.  . meloxicam (MOBIC) 15 MG tablet Take 15 mg by mouth daily.  . metFORMIN (GLUCOPHAGE) 1000 MG tablet TAKE 1 TABLET (1,000 MG TOTAL)  BY MOUTH 2 (TWO) TIMES DAILY WITH A MEAL.  . MULTIPLE VITAMIN PO Take 1 tablet by mouth daily.   . niacin 500 MG tablet Take 500 mg by mouth every morning.   . Omega-3 Fatty Acids (EQL OMEGA 3 FISH OIL) 1000 MG CAPS Take 1 capsule by mouth 2 (two) times daily.  [name] VERIO test strip USE TO TEST BLOOD SUGAR 1-3 TIMES DAILY  . pantoprazole (PROTONIX) 40 MG tablet TAKE 1 TABLET BY MOUTH EVERY DAY  .  prednisoLONE acetate (PRED FORTE) 1 % ophthalmic suspension INSTILL 1 DROP INTO LEFT EYE TWICE A DAY  . Probiotic Product (PROBIOTIC ADVANCED PO) Take by mouth every evening. [employer] brand takes 2 tabs  . spironolactone (ALDACTONE) 25 MG tablet Take 1 tablet (25 mg total) by mouth daily.  Marland Kitchen UNABLE TO FIND Take 500 mcg by mouth daily. Med Name: Vitamin K2  . UNABLE TO FIND Med Name: Natural MK-7-100 MCG WITH MK-4-100MG   . carvedilol (COREG) 25 MG tablet Take 1 tablet (25 mg total) by mouth 2 (two) times daily.   No facility-administered encounter medications on file as of 05/30/2020.    Allergies (verified) Dexilant [dexlansoprazole]   History: Past Medical History:  Diagnosis Date  . CAD (coronary artery disease)   . Cancer ([place])    basal cell excision upper right ear 7 yrs ago  . Complication of anesthesia    bad pain with anesthesia induction with colonscopy 6 yrs ago  . Diabetes mellitus without complication ([place])    type 2  . GERD (gastroesophageal reflux disease)   . Hyperlipidemia   . Hypertension   . Peripheral vascular disease ([place])    partial clogged artery right leg  . Sleep apnea    yrs ago sleep study no cpap used   Past Surgical History:  Procedure Laterality Date  . ABDOMINAL AORTOGRAM W/LOWER EXTREMITY N/A 12/27/2016   Procedure: Abdominal Aortogram w/Lower Extremity;  Surgeon: Angelia Mould, MD;  Location: [facility];  Service: Cardiovascular;  Laterality: N/A;  . CARDIAC SURGERY    . CORONARY ANGIOPLASTY WITH STENT PLACEMENT  11/2004   in [place]  . MENISCUS REPAIR Right   . NASAL SINUS SURGERY  1976  . TONSILLECTOMY     Family History  Problem Relation Age of Onset  . Alcohol abuse Brother   . Cancer Brother   . Diabetes Brother   . Hyperlipidemia Brother   . Hypertension Brother   . Diabetes Mother   . Hypertension Mother   . Hyperlipidemia Mother   . Heart attack Mother   . Hyperlipidemia Father   . Hypertension Father    Social  History   Socioeconomic History  . Marital status: Married    Spouse name: Not on file  . Number of children: 2  . Years of education: Not on file  . Highest education level: Not on file  Occupational History  . Not on file  Tobacco Use  . Smoking status: Former Smoker    Packs/day: 1.50    Years: 26.00    Pack years: 39.00    Types: Cigarettes    Quit date: 1994    Years since quitting: 27.6  . Smokeless tobacco: Former Network engineer  . Vaping Use: Never used  Substance and Sexual Activity  . Alcohol use: Yes    Alcohol/week: 0.0 standard drinks    Comment: 3 ounces on monday  . Drug use: No  . Sexual activity: Yes  Other  Topics Concern  . Not on file  Social History Narrative  . Not on file   Social Determinants of Health   Financial Resource Strain: Low Risk   . Difficulty of Paying Living Expenses: Not hard at all  Food Insecurity: No Food Insecurity  . Worried About Charity fundraiser in the Last Year: Never true  . Ran Out of Food in the Last Year: Never true  Transportation Needs: No Transportation Needs  . Lack of Transportation (Medical): No  . Lack of Transportation (Non-Medical): No  Physical Activity:   . Days of Exercise per Week:   . Minutes of Exercise per Session:   Stress:   . Feeling of Stress :   Social Connections:   . Frequency of Communication with Friends and Family:   . Frequency of Social Gatherings with Friends and Family:   . Attends Religious Services:   . Active Member of Clubs or Organizations:   . Attends Archivist Meetings:   Marland Kitchen Marital Status:     Tobacco Counseling Counseling given: Not Answered   Clinical Intake: Pain : No/denies pain    Activities of Daily Living In your present state of health, do you have any difficulty performing the following activities: 05/30/2020  Hearing? Y  Comment has hearing aids.  Vision? N  Difficulty concentrating or making decisions? N  Walking or climbing stairs? N    Dressing or bathing? N  Doing errands, shopping? N  Preparing Food and eating ? N  Using the Toilet? N  In the past six months, have you accidently leaked urine? Y  Do you have problems with loss of bowel control? N  Managing your Medications? N  Managing your Finances? N  Housekeeping or managing your Housekeeping? N  Some recent data might be hidden    Patient Care Team: Copland, Gay Filler, MD as PCP - General (Family Medicine) Stanford Breed Denice Bors, MD as Consulting Physician (Cardiology) [name], Candee Furbish, MD as Consulting Physician (Urology)  Indicate any recent Medical Services you may have received from other than [facility] providers in the past year (date may be approximate).     Assessment:   This is a routine wellness examination for Richards.   Dietary issues and exercise activities discussed: Current Exercise Habits: The patient does not participate in regular exercise at present, Exercise limited by: None identified Diet (meal preparation, eat out, water intake, caffeinated beverages, dairy products, fruits and vegetables): well balanced   Goals    . Increase physical activity      Depression Screen PHQ 2/9 Scores 05/30/2020 02/18/2020 10/06/2017 09/29/2016  PHQ - 2 Score 0 0 1 0    Fall Risk Fall Risk  05/30/2020 02/18/2020 10/06/2017 09/29/2016  Falls in the past year? 0 0 No No  Number falls in past yr: 0 - - -  Injury with Fall? 0 - - -  Follow up Education provided;Falls prevention discussed - - -    Any stairs in or around the home? Yes  If so, are there any without handrails? No  Home free of loose throw rugs in walkways, pet beds, electrical cords, etc? Yes  Adequate lighting in your home to reduce risk of falls? Yes   Cognitive Function: Ad8 score reviewed for issues:  Issues making decisions:no  Less interest in hobbies / activities:no  Repeats questions, stories (family complaining):no  Trouble using ordinary gadgets (microwave, computer,  phone):no  Forgets the month or year: no  Mismanaging finances: no  Remembering appts:no  Daily problems with thinking and/or memory:no Ad8 score is=0          Immunizations Immunization History  Administered Date(s) Administered  . Influenza, High Dose Seasonal PF 06/23/2017, 06/25/2018, 06/14/2019  . Influenza-Unspecified 07/24/2015, 07/22/2016, 06/23/2017  . Pneumococcal Conjugate-13 01/10/2017  . Pneumococcal Polysaccharide-23 05/22/2018  . Tdap 12/09/2016  . Zoster 07/24/2015    TDAP status: Up to date Flu Vaccine status: Up to date Pneumococcal vaccine status: Up to date Covid-19 vaccine status: Declined, Education has been provided regarding the importance of this vaccine but patient still declined. Advised may receive this vaccine at local pharmacy or Health Dept.or vaccine clinic. Aware to provide a copy of the vaccination record if obtained from local pharmacy or Health Dept. Verbalized acceptance and understanding.  Qualifies for Shingles Vaccine? Yes   Zostavax completed Yes     Screening Tests Health Maintenance  Topic Date Due  . OPHTHALMOLOGY EXAM  07/15/2019  . INFLUENZA VACCINE  05/25/2020  . COVID-19 Vaccine (1) 06/15/2020 (Originally 12/21/1962)  . HEMOGLOBIN A1C  07/31/2020  . COLONOSCOPY  10/25/2020  . FOOT EXAM  02/17/2021  . TETANUS/TDAP  12/09/2026  . Hepatitis C Screening  Completed  . PNA vac Low Risk Adult  Completed    Health Maintenance  Health Maintenance Due  Topic Date Due  . OPHTHALMOLOGY EXAM  07/15/2019  . INFLUENZA VACCINE  05/25/2020    Colonoscopy last reported 2012 w/ 10 yr recall. Not on file.    Additional Screening:  Hepatitis C Screening: does qualify; Completed 12/09/16  Vision Screening: Recommended annual ophthalmology exams for early detection of glaucoma and other disorders of the eye. Is the patient up to date with their annual eye exam?  Yes    Dental Screening: Recommended annual dental exams for  proper oral hygiene  Community Resource Referral / Chronic Care Management: CRR required this visit?  No   CCM required this visit?  No      Plan:    Please schedule your next medicare wellness visit with me in 1 yr.  Continue to eat heart healthy diet (full of fruits, vegetables, whole grains, lean protein, water--limit salt, fat, and sugar intake) and increase physical activity as tolerated.  Continue doing brain stimulating activities (puzzles, reading, adult coloring books, staying active) to keep memory sharp.   Bring a copy of your living will and/or healthcare power of attorney to your next office visit.  I have personally reviewed and noted the following in the patient's chart:   . Medical and social history . Use of alcohol, tobacco or illicit drugs  . Current medications and supplements . Functional ability and status . Nutritional status . Physical activity . Advanced directives . List of other physicians . Hospitalizations, surgeries, and ER visits in previous 12 months . Vitals . Screenings to include cognitive, depression, and falls . Referrals and appointments  In addition, I have reviewed and discussed with patient certain preventive protocols, quality metrics, and best practice recommendations. A written personalized care plan for preventive services as well as general preventive health recommendations were provided to patient.   Due to this being a telephonic visit, the after visit summary with patients personalized plan was offered to patient via mail or my-chart. Patient would like to access on my-chart.   Shela Nevin, [place]   05/30/2020

## 2020-05-30 ENCOUNTER — Other Ambulatory Visit: Payer: Self-pay

## 2020-05-30 ENCOUNTER — Ambulatory Visit (INDEPENDENT_AMBULATORY_CARE_PROVIDER_SITE_OTHER): Payer: Medicare HMO | Admitting: *Deleted

## 2020-05-30 ENCOUNTER — Encounter: Payer: Self-pay | Admitting: *Deleted

## 2020-05-30 VITALS — BP 103/67

## 2020-05-30 DIAGNOSIS — Z0000 Encounter for general adult medical examination without abnormal findings: Secondary | ICD-10-CM | POA: Diagnosis not present

## 2020-05-30 NOTE — Patient Instructions (Signed)
Please schedule your next medicare wellness visit with me in 1 yr.  Continue to eat heart healthy diet (full of fruits, vegetables, whole grains, lean protein, water--limit salt, fat, and sugar intake) and increase physical activity as tolerated.  Continue doing brain stimulating activities (puzzles, reading, adult coloring books, staying active) to keep memory sharp.   Bring a copy of your living will and/or healthcare power of attorney to your next office visit.   David Singh , Thank you for taking time to come for your Medicare Wellness Visit. I appreciate your ongoing commitment to your health goals. Please review the following plan we discussed and let me know if I can assist you in the future.   These are the goals we discussed: Goals    . Increase physical activity       This is a list of the screening recommended for you and due dates:  Health Maintenance  Topic Date Due  . Eye exam for diabetics  07/15/2019  . Flu Shot  05/25/2020  . COVID-19 Vaccine (1) 06/15/2020*  . Hemoglobin A1C  07/31/2020  . Colon Cancer Screening  10/25/2020  . Complete foot exam   02/17/2021  . Tetanus Vaccine  12/09/2026  .  Hepatitis C: One time screening is recommended by Center for Disease Control  (CDC) for  adults born from [birth year] through 1964.   Completed  . Pneumonia vaccines  Completed  *Topic was postponed. The date shown is not the original due date.    Preventive Care 62 Years and Older, Male Preventive care refers to lifestyle choices and visits with your health care provider that can promote health and wellness. This includes:  A yearly physical exam. This is also called an annual well check.  Regular dental and eye exams.  Immunizations.  Screening for certain conditions.  Healthy lifestyle choices, such as diet and exercise. What can I expect for my preventive care visit? Physical exam Your health care provider will check:  Height and weight. These may be used to  calculate body mass index (BMI), which is a measurement that tells if you are at a healthy weight.  Heart rate and blood pressure.  Your skin for abnormal spots. Counseling Your health care provider may ask you questions about:  Alcohol, tobacco, and drug use.  Emotional well-being.  Home and relationship well-being.  Sexual activity.  Eating habits.  History of falls.  Memory and ability to understand (cognition).  Work and work Statistician. What immunizations do I need?  Influenza (flu) vaccine  This is recommended every year. Tetanus, diphtheria, and pertussis (Tdap) vaccine  You may need a Td booster every 10 years. Varicella (chickenpox) vaccine  You may need this vaccine if you have not already been vaccinated. Zoster (shingles) vaccine  You may need this after age 37. Pneumococcal conjugate (PCV13) vaccine  One dose is recommended after age 66. Pneumococcal polysaccharide (PPSV23) vaccine  One dose is recommended after age 53. Measles, mumps, and rubella (MMR) vaccine  You may need at least one dose of MMR if you were born in 1957 or later. You may also need a second dose. Meningococcal conjugate (MenACWY) vaccine  You may need this if you have certain conditions. Hepatitis A vaccine  You may need this if you have certain conditions or if you travel or work in places where you may be exposed to hepatitis A. Hepatitis B vaccine  You may need this if you have certain conditions or if you travel or work  in places where you may be exposed to hepatitis B. Haemophilus influenzae type b (Hib) vaccine  You may need this if you have certain conditions. You may receive vaccines as individual doses or as more than one vaccine together in one shot (combination vaccines). Talk with your health care provider about the risks and benefits of combination vaccines. What tests do I need? Blood tests  Lipid and cholesterol levels. These may be checked every 5 years,  or more frequently depending on your overall health.  Hepatitis C test.  Hepatitis B test. Screening  Lung cancer screening. You may have this screening every year starting at age 62 if you have a 30-pack-year history of smoking and currently smoke or have quit within the past 15 years.  Colorectal cancer screening. All adults should have this screening starting at age 25 and continuing until age 66. Your health care provider may recommend screening at age 14 if you are at increased risk. You will have tests every 1-10 years, depending on your results and the type of screening test.  Prostate cancer screening. Recommendations will vary depending on your family history and other risks.  Diabetes screening. This is done by checking your blood sugar (glucose) after you have not eaten for a while (fasting). You may have this done every 1-3 years.  Abdominal aortic aneurysm (AAA) screening. You may need this if you are a current or former smoker.  Sexually transmitted disease (STD) testing. Follow these instructions at home: Eating and drinking  Eat a diet that includes fresh fruits and vegetables, whole grains, lean protein, and low-fat dairy products. Limit your intake of foods with high amounts of sugar, saturated fats, and salt.  Take vitamin and mineral supplements as recommended by your health care provider.  Do not drink alcohol if your health care provider tells you not to drink.  If you drink alcohol: ? Limit how much you have to 0-2 drinks a day. ? Be aware of how much alcohol is in your drink. In the [place] one drink equals one 12 oz bottle of beer (355 mL), one 5 oz glass of wine (148 mL), or one 1 oz glass of hard liquor (44 mL). Lifestyle  Take daily care of your teeth and gums.  Stay active. Exercise for at least 30 minutes on 5 or more days each week.  Do not use any products that contain nicotine or tobacco, such as cigarettes, e-cigarettes, and chewing tobacco. If you  need help quitting, ask your health care provider.  If you are sexually active, practice safe sex. Use a condom or other form of protection to prevent STIs (sexually transmitted infections).  Talk with your health care provider about taking a low-dose aspirin or statin. What's next?  Visit your health care provider once a year for a well check visit.  Ask your health care provider how often you should have your eyes and teeth checked.  Stay up to date on all vaccines. This information is not intended to replace advice given to you by your health care provider. Make sure you discuss any questions you have with your health care provider. Document Revised: 10/05/2018 Document Reviewed: 10/05/2018 Elsevier Patient Education  2020 [employer].

## 2020-06-02 ENCOUNTER — Encounter: Payer: Self-pay | Admitting: Family Medicine

## 2020-06-06 DIAGNOSIS — R69 Illness, unspecified: Secondary | ICD-10-CM | POA: Diagnosis not present

## 2020-06-12 DIAGNOSIS — N138 Other obstructive and reflux uropathy: Secondary | ICD-10-CM | POA: Diagnosis not present

## 2020-06-12 DIAGNOSIS — R972 Elevated prostate specific antigen [PSA]: Secondary | ICD-10-CM | POA: Diagnosis not present

## 2020-06-12 DIAGNOSIS — R339 Retention of urine, unspecified: Secondary | ICD-10-CM | POA: Diagnosis not present

## 2020-06-12 DIAGNOSIS — N401 Enlarged prostate with lower urinary tract symptoms: Secondary | ICD-10-CM | POA: Diagnosis not present

## 2020-06-15 NOTE — Progress Notes (Deleted)
[facility] [address], [place], [place] [address] [phone]  Date:  06/18/2020   Name:  David Singh   DOB:  1951/09/17   MRN:  [id number]  PCP:  Darreld [facility], MD    Chief Complaint: No chief complaint on file.   History of Present Illness:  David Singh is a 69 y.o. very pleasant male patient who presents with the following:  Periodic follow-up visit today Last seen by myself in April for CPE History of diabetes, CAD with stent, hyperlipidemia, hypertension, significant peripheral arterial disease We had him increase his lisinopril to 60 mg after last visit due to persistently elevated BP numbers  Seen by his urologist earlier this month Visit with endocrinology- Dr Posey Pronto with [facility] Perch in July: ASSESSMENT/PLAN: 1. Type 2 diabetes mellitus with diabetic peripheral angiopathy without gangrene, without long-term current use of insulin ([place]) I had a detailed discussion about pathophysiology and complications of diabetes with the patient. I also discussed about further management of diabetes in detail with the patient. I stressed the importance of regular eye exam every year and regular self foot exams. I also stressed the importance of diet and exercise in controlling diabetes better and checking her blood sugar regularly. I had detailed discussion about diabetes and precautions while driving since diabetic drugs and insulin have potential for adverse effects including hypoglycemia which can cause MVA. I instructed patient to monitor blood sugar prior to driving specially if he has history of hypoglycemia episodes. I have also discussed with patient about management of hypoglycemia.  I have recommended regular eye exam to the patient at least once every year.  Discussed with the patient about option of switching glipizide to one of the newer medications like SGLT2 inhibitor or GLP-1 analog vs continuing  the current regimen with intensifying dietary changes. Patient has elected to continue with the current regimen for now with Metformin and glipizide. Patient will intensify dietary and lifestyle modifications. Hopefully patient's hemoglobin A1c will improve. If the diabetes gets worse in future then consider adding 1 of those options. - POCT HEMOGLOBIN A1C 2. Mixed hyperlipidemia I have advised the patient to decrease dairy product intake in diet. Continue with Lipitor. 3. Morbid obesity ([place]) I have emphasized to the patient about importance of continuing with dietary instructions and lifestyle modification.    Patient Active Problem List   Diagnosis Date Noted  . Primary osteoarthritis of right knee 03/03/2020  . Chronic gout 03/09/2016  . Hypertriglyceridemia 01/16/2015  . Type 2 diabetes mellitus ([place]) 01/06/2015  . CAD in native artery 01/06/2015  . History of coronary artery stent placement 01/06/2015  . PAD (peripheral artery disease) ([place]) 01/06/2015  . Hyperlipidemia 01/06/2015  . Essential hypertension 01/06/2015    Past Medical History:  Diagnosis Date  . CAD (coronary artery disease)   . Cancer ([place])    basal cell excision upper right ear 7 yrs ago  . Complication of anesthesia    bad pain with anesthesia induction with colonscopy 6 yrs ago  . Diabetes mellitus without complication ([place])    type 2  . GERD (gastroesophageal reflux disease)   . Hyperlipidemia   . Hypertension   . Peripheral vascular disease ([place])    partial clogged artery right leg  . Sleep apnea    yrs ago sleep study no cpap used    Past Surgical History:  Procedure Laterality Date  . ABDOMINAL AORTOGRAM W/LOWER EXTREMITY N/A  12/27/2016   Procedure: Abdominal Aortogram w/Lower Extremity;  Surgeon: Angelia Mould, MD;  Location: [facility];  Service: Cardiovascular;  Laterality: N/A;  . CARDIAC SURGERY    . CORONARY ANGIOPLASTY WITH STENT PLACEMENT  11/2004   in [place]  .  MENISCUS REPAIR Right   . NASAL SINUS SURGERY  1976  . TONSILLECTOMY      Social History   Tobacco Use  . Smoking status: Former Smoker    Packs/day: 1.50    Years: 26.00    Pack years: 39.00    Types: Cigarettes    Quit date: 1994    Years since quitting: 27.6  . Smokeless tobacco: Former Network engineer  . Vaping Use: Never used  Substance Use Topics  . Alcohol use: Yes    Alcohol/week: 0.0 standard drinks    Comment: 3 ounces on monday  . Drug use: No    Family History  Problem Relation Age of Onset  . Alcohol abuse Brother   . Cancer Brother   . Diabetes Brother   . Hyperlipidemia Brother   . Hypertension Brother   . Diabetes Mother   . Hypertension Mother   . Hyperlipidemia Mother   . Heart attack Mother   . Hyperlipidemia Father   . Hypertension Father     Allergies  Allergen Reactions  . Dexilant [Dexlansoprazole]     Pressure in stomach and chest    Medication list has been reviewed and updated.  Current Outpatient Medications on File Prior to Visit  Medication Sig Dispense Refill  . amLODipine (NORVASC) 10 MG tablet TAKE 1 TABLET BY MOUTH EVERY DAY 90 tablet 3  . aspirin 81 MG tablet Take 81 mg by mouth daily.    Marland Kitchen atorvastatin (LIPITOR) 80 MG tablet TAKE 1 TABLET BY MOUTH EVERY DAY 90 tablet 3  . Blood Glucose Monitoring Suppl (FREESTYLE LITE) DEVI CHECK BLOOD SUGAR ONCE DAILY  0  . Blood Glucose Monitoring Suppl (FREESTYLE LITE) DEVI CHECK BLOOD SUGAR ONCE DAILY 1 each 0  . busPIRone (BUSPAR) 15 MG tablet Take 1 tablet (15 mg total) by mouth 2 (two) times daily. (Patient taking differently: Take 10 mg by mouth 2 (two) times daily. ) 180 tablet 3  . carvedilol (COREG) 25 MG tablet Take 1 tablet (25 mg total) by mouth 2 (two) times daily. 180 tablet 3  . Cholecalciferol (VITAMIN D3) 2000 units TABS Take 2,000 Units by mouth daily.    . cloNIDine (CATAPRES) 0.2 MG tablet Take 1 tablet (0.2 mg total) by mouth 2 (two) times daily. 180 tablet 3  .  Coenzyme Q10 300 MG CAPS Take 300 mg by mouth daily.     . Cyanocobalamin (VITAMIN B-12 PO) Take 2,500 mg by mouth daily.    . Diclofenac Sodium 1.5 % SOLN Apply 40 drops topically 4 (four) times daily as needed. 150 mL 3  . glucose blood (FREESTYLE LITE) test strip Check blood sugar 1-3x daily 100 each 12  . glucose blood (FREESTYLE LITE) test strip CHECK BLOOD SUGAR ONCE DAILY 100 each 2  . Lancets (FREESTYLE) lancets Check blood sugar once daily 100 each 12  . lisinopril (ZESTRIL) 40 MG tablet Take 1.5 tablets (60 mg total) by mouth daily. 135 tablet 3  . meloxicam (MOBIC) 15 MG tablet Take 15 mg by mouth daily.    . metFORMIN (GLUCOPHAGE) 1000 MG tablet TAKE 1 TABLET (1,000 MG TOTAL) BY MOUTH 2 (TWO) TIMES DAILY WITH A MEAL. 180 tablet 3  .  MULTIPLE VITAMIN PO Take 1 tablet by mouth daily.     . niacin 500 MG tablet Take 500 mg by mouth every morning.     . Omega-3 Fatty Acids (EQL OMEGA 3 FISH OIL) 1000 MG CAPS Take 1 capsule by mouth 2 (two) times daily.    [name] VERIO test strip USE TO TEST BLOOD SUGAR 1-3 TIMES DAILY 100 strip 3  . pantoprazole (PROTONIX) 40 MG tablet TAKE 1 TABLET BY MOUTH EVERY DAY 90 tablet 1  . prednisoLONE acetate (PRED FORTE) 1 % ophthalmic suspension INSTILL 1 DROP INTO LEFT EYE TWICE A DAY    . Probiotic Product (PROBIOTIC ADVANCED PO) Take by mouth every evening. [employer] brand takes 2 tabs    . spironolactone (ALDACTONE) 25 MG tablet Take 1 tablet (25 mg total) by mouth daily. 90 tablet 2  . UNABLE TO FIND Take 500 mcg by mouth daily. Med Name: Vitamin K2    . UNABLE TO FIND Med Name: Natural MK-7-100 MCG WITH MK-4-100MG      No current facility-administered medications on file prior to visit.    Review of Systems:  As per HPI- otherwise negative.   Physical Examination: There were no vitals filed for this visit. There were no vitals filed for this visit. There is no height or weight on file to calculate BMI. Ideal Body Weight:    GEN: no acute  distress. HEENT: Atraumatic, Normocephalic.  Ears and Nose: No external deformity. CV: RRR, No M/G/R. No JVD. No thrill. No extra heart sounds. PULM: CTA B, no wheezes, crackles, rhonchi. No retractions. No resp. distress. No accessory muscle use. ABD: S, NT, ND, +BS. No rebound. No HSM. EXTR: No c/c/e PSYCH: Normally interactive. Conversant.    Assessment and Plan: *** This visit occurred during the SARS-CoV-2 public health emergency.  Safety protocols were in place, including screening questions prior to the visit, additional usage of staff PPE, and extensive cleaning of exam room while observing appropriate contact time as indicated for disinfecting solutions.    Signed Lamar Blinks, MD

## 2020-06-15 NOTE — Patient Instructions (Addendum)
Great to see you again today!  Please see me in about 6 months and take care I will be in touch with your labs [facility] to use meloxicam as needed for shoulder/ joint pain

## 2020-06-16 NOTE — Progress Notes (Signed)
HPI: FUCAD.Patienthad PCI in [place] previously. No records available. Patient is followed by vascular surgery for peripheral vascular disease. Angiogram March 2018 showed no renal artery stenosis. 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 There was reconstitution of the distal right common iliac artery;it was felt medical therapy indicated.Nuclear study April 2018 showed ejection fraction 54% with no ischemia or infarction. ABIs1/20showed moderate disease on the right and normal on the left. Patient apparently had attempt at iliac recanalization in [facility] which was unsuccessful March 2020.  Abdominal ultrasound May 2021 showed no aneurysm.  Since last seen, the patient denies any dyspnea on exertion, orthopnea, PND, pedal edema, palpitations, syncope or chest pain.   Current Outpatient Medications  Medication Sig Dispense Refill  . amLODipine (NORVASC) 10 MG tablet TAKE 1 TABLET BY MOUTH EVERY DAY 90 tablet 3  . aspirin 81 MG tablet Take 81 mg by mouth daily.    Marland Kitchen atorvastatin (LIPITOR) 80 MG tablet TAKE 1 TABLET BY MOUTH EVERY DAY 90 tablet 3  . Blood Glucose Monitoring Suppl (FREESTYLE LITE) DEVI CHECK BLOOD SUGAR ONCE DAILY  0  . Blood Glucose Monitoring Suppl (FREESTYLE LITE) DEVI CHECK BLOOD SUGAR ONCE DAILY 1 each 0  . busPIRone (BUSPAR) 15 MG tablet Take 1 tablet (15 mg total) by mouth 2 (two) times daily. (Patient taking differently: Take 10 mg by mouth 2 (two) times daily. ) 180 tablet 3  . Cholecalciferol (VITAMIN D3) 2000 units TABS Take 2,000 Units by mouth daily.    . cloNIDine (CATAPRES) 0.2 MG tablet Take 1 tablet (0.2 mg total) by mouth 2 (two) times daily. 180 tablet 3  . Coenzyme Q10 300 MG CAPS Take 300 mg by mouth daily.     . Cyanocobalamin (VITAMIN B-12 PO) Take 2,500 mg by mouth daily.    . Diclofenac Sodium 1.5 % SOLN Apply 40 drops topically 4 (four) times daily as needed. 150 mL 3  .  glucose blood (FREESTYLE LITE) test strip Check blood sugar 1-3x daily 100 each 12  . glucose blood (FREESTYLE LITE) test strip CHECK BLOOD SUGAR ONCE DAILY 100 each 2  . Lancets (FREESTYLE) lancets Check blood sugar once daily 100 each 12  . lisinopril (ZESTRIL) 40 MG tablet Take 1.5 tablets (60 mg total) by mouth daily. 135 tablet 3  . meloxicam (MOBIC) 15 MG tablet Take 1 tablet (15 mg total) by mouth daily. 90 tablet 1  . metFORMIN (GLUCOPHAGE) 1000 MG tablet TAKE 1 TABLET (1,000 MG TOTAL) BY MOUTH 2 (TWO) TIMES DAILY WITH A MEAL. 180 tablet 3  . MULTIPLE VITAMIN PO Take 1 tablet by mouth daily.     . niacin 500 MG tablet Take 500 mg by mouth every morning.     . Omega-3 Fatty Acids (EQL OMEGA 3 FISH OIL) 1000 MG CAPS Take 1 capsule by mouth 2 (two) times daily.    [name] VERIO test strip USE TO TEST BLOOD SUGAR 1-3 TIMES DAILY 100 strip 3  . pantoprazole (PROTONIX) 40 MG tablet TAKE 1 TABLET BY MOUTH EVERY DAY 90 tablet 1  . prednisoLONE acetate (PRED FORTE) 1 % ophthalmic suspension INSTILL 1 DROP INTO LEFT EYE TWICE A DAY    . Probiotic Product (PROBIOTIC ADVANCED PO) Take by mouth every evening. [employer] brand takes 2 tabs    . spironolactone (ALDACTONE) 25 MG tablet Take 1 tablet (25 mg total) by mouth daily. 90 tablet 2  .  UNABLE TO FIND Take 500 mcg by mouth daily. Med Name: Vitamin K2    . UNABLE TO FIND Med Name: Natural MK-7-100 MCG WITH MK-4-100MG     . carvedilol (COREG) 25 MG tablet Take 1 tablet (25 mg total) by mouth 2 (two) times daily. 180 tablet 3   No current facility-administered medications for this visit.     Past Medical History:  Diagnosis Date  . CAD (coronary artery disease)   . Cancer ([place])    basal cell excision upper right ear 7 yrs ago  . Complication of anesthesia    bad pain with anesthesia induction with colonscopy 6 yrs ago  . Diabetes mellitus without complication ([place])    type 2  . GERD (gastroesophageal reflux disease)   . Hyperlipidemia   .  Hypertension   . Peripheral vascular disease ([place])    partial clogged artery right leg  . Sleep apnea    yrs ago sleep study no cpap used    Past Surgical History:  Procedure Laterality Date  . ABDOMINAL AORTOGRAM W/LOWER EXTREMITY N/A 12/27/2016   Procedure: Abdominal Aortogram w/Lower Extremity;  Surgeon: Angelia Mould, MD;  Location: [facility];  Service: Cardiovascular;  Laterality: N/A;  . CARDIAC SURGERY    . CORONARY ANGIOPLASTY WITH STENT PLACEMENT  11/2004   in [place]  . MENISCUS REPAIR Right   . NASAL SINUS SURGERY  1976  . TONSILLECTOMY      Social History   Socioeconomic History  . Marital status: Married    Spouse name: Not on file  . Number of children: 2  . Years of education: Not on file  . Highest education level: Not on file  Occupational History  . Not on file  Tobacco Use  . Smoking status: Former Smoker    Packs/day: 1.50    Years: 26.00    Pack years: 39.00    Types: Cigarettes    Quit date: 1994    Years since quitting: 27.6  . Smokeless tobacco: Former Network engineer  . Vaping Use: Never used  Substance and Sexual Activity  . Alcohol use: Yes    Alcohol/week: 0.0 standard drinks    Comment: 3 ounces on monday  . Drug use: No  . Sexual activity: Yes  Other Topics Concern  . Not on file  Social History Narrative  . Not on file   Social Determinants of Health   Financial Resource Strain: Low Risk   . Difficulty of Paying Living Expenses: Not hard at all  Food Insecurity: No Food Insecurity  . Worried About Charity fundraiser in the Last Year: Never true  . Ran Out of Food in the Last Year: Never true  Transportation Needs: No Transportation Needs  . Lack of Transportation (Medical): No  . Lack of Transportation (Non-Medical): No  Physical Activity:   . Days of Exercise per Week: Not on file  . Minutes of Exercise per Session: Not on file  Stress:   . Feeling of Stress : Not on file  Social Connections:   .  Frequency of Communication with Friends and Family: Not on file  . Frequency of Social Gatherings with Friends and Family: Not on file  . Attends Religious Services: Not on file  . Active Member of Clubs or Organizations: Not on file  . Attends Archivist Meetings: Not on file  . Marital Status: Not on file  Intimate Partner Violence:   . Fear of Current or  Ex-Partner: Not on file  . Emotionally Abused: Not on file  . Physically Abused: Not on file  . Sexually Abused: Not on file    Family History  Problem Relation Age of Onset  . Alcohol abuse Brother   . Cancer Brother   . Diabetes Brother   . Hyperlipidemia Brother   . Hypertension Brother   . Diabetes Mother   . Hypertension Mother   . Hyperlipidemia Mother   . Heart attack Mother   . Hyperlipidemia Father   . Hypertension Father     ROS: no fevers or chills, productive cough, hemoptysis, dysphasia, odynophagia, melena, hematochezia, dysuria, hematuria, rash, seizure activity, orthopnea, PND, pedal edema, claudication. Remaining systems are negative.  Physical Exam: Well-developed obese in no acute distress.  Skin is warm and dry.  HEENT is normal.  Neck is supple.  Chest is clear to auscultation with normal expansion.  Cardiovascular exam is regular rate and rhythm.  Abdominal exam nontender or distended. No masses palpated. Extremities show no edema. neuro grossly intact   A/P  1 coronary artery disease-patient denies recurrent chest pain.  Continue medical therapy with aspirin and statin.  2 hypertension-patient's blood pressure is controlled.  Continue present medications.  3 hyperlipidemia-continue statin.  4 morbid obesity-we discussed the importance of diet, exercise and weight loss.  5 peripheral vascular disease-followed by vascular surgery.  Previous attempt at recanalization of iliac was unsuccessful.  Kirk Ruths, MD

## 2020-06-18 ENCOUNTER — Encounter: Payer: Self-pay | Admitting: Family Medicine

## 2020-06-18 ENCOUNTER — Ambulatory Visit (INDEPENDENT_AMBULATORY_CARE_PROVIDER_SITE_OTHER): Payer: Medicare HMO | Admitting: Family Medicine

## 2020-06-18 ENCOUNTER — Other Ambulatory Visit: Payer: Self-pay

## 2020-06-18 VITALS — BP 130/80 | HR 73 | Temp 98.1°F | Resp 17 | Ht 69.0 in | Wt 282.0 lb

## 2020-06-18 DIAGNOSIS — I739 Peripheral vascular disease, unspecified: Secondary | ICD-10-CM | POA: Diagnosis not present

## 2020-06-18 DIAGNOSIS — E119 Type 2 diabetes mellitus without complications: Secondary | ICD-10-CM

## 2020-06-18 DIAGNOSIS — I10 Essential (primary) hypertension: Secondary | ICD-10-CM

## 2020-06-18 DIAGNOSIS — G8929 Other chronic pain: Secondary | ICD-10-CM

## 2020-06-18 DIAGNOSIS — M25512 Pain in left shoulder: Secondary | ICD-10-CM

## 2020-06-18 DIAGNOSIS — E785 Hyperlipidemia, unspecified: Secondary | ICD-10-CM

## 2020-06-18 MED ORDER — MELOXICAM 15 MG PO TABS: 15.0000 mg | ORAL_TABLET | Freq: Every day | ORAL | 1 refills | Status: DC

## 2020-06-18 NOTE — Progress Notes (Addendum)
[place] at [facility] [address], [place], [place] [address] [phone]  Date:  06/18/2020   Name:  David Singh   DOB:  July 06, 1951   MRN:  [id number]  PCP:  Darreld Mclean, MD    Chief Complaint: Follow-up   History of Present Illness:  David Singh is a 69 y.o. very pleasant male patient who presents with the following:  Periodic follow-up visit today Last seen by myself in April for CPE History of diabetes, CAD with stent, hyperlipidemia, hypertension, significant peripheral arterial disease We had him increase his lisinopril to 60 mg after last visit due to persistently elevated BP numbers He has been checking his blood pressures at home, and has noticed better control  Seen by his urologist earlier this month Visit with endocrinology- Dr Posey Pronto with [facility] in July: ASSESSMENT/PLAN: 1. Type 2 diabetes mellitus with diabetic peripheral angiopathy without gangrene, without long-term current use of insulin ([place]) I had a detailed discussion about pathophysiology and complications of diabetes with the patient. I also discussed about further management of diabetes in detail with the patient. I stressed the importance of regular eye exam every year and regular self foot exams. I also stressed the importance of diet and exercise in controlling diabetes better and checking her blood sugar regularly. I had detailed discussion about diabetes and precautions while driving since diabetic drugs and insulin have potential for adverse effects including hypoglycemia which can cause MVA. I instructed patient to monitor blood sugar prior to driving specially if he has history of hypoglycemia episodes. I have also discussed with patient about management of hypoglycemia.  I have recommended regular eye exam to the patient at least once every year.  Discussed with the patient about option of switching glipizide to one of the newer  medications like SGLT2 inhibitor or GLP-1 analog vs continuing the current regimen with intensifying dietary changes. Patient has elected to continue with the current regimen for now with Metformin and glipizide. Patient will intensify dietary and lifestyle modifications. Hopefully patient's hemoglobin A1c will improve. If the diabetes gets worse in future then consider adding 1 of those options. - POCT HEMOGLOBIN A1C 2. Mixed hyperlipidemia I have advised the patient to decrease dairy product intake in diet. Continue with Lipitor. 3. Morbid obesity ([place]) I have emphasized to the patient about importance of continuing with dietary instructions and lifestyle modification.   Would like to check A1c today He is fasting today His urologist checked his PSA last week- went over results with him, likely will need recheck in 6 months but defer to urology  Seeing cardiology next week He has not yet done his covid 19 vaccine - he and his wife have decided not to get this vaccine.  We did discuss this briefly today, I encouraged him to get this vaccine for the sake of his health and our community His PAD is stable, walking ability will wax and wane somewhat  Patient Active Problem List   Diagnosis Date Noted  . Primary osteoarthritis of right knee 03/03/2020  . Chronic gout 03/09/2016  . Hypertriglyceridemia 01/16/2015  . Type 2 diabetes mellitus ([place]) 01/06/2015  . CAD in native artery 01/06/2015  . History of coronary artery stent placement 01/06/2015  . PAD (peripheral artery disease) ([place]) 01/06/2015  . Hyperlipidemia 01/06/2015  . Essential hypertension 01/06/2015    Past Medical History:  Diagnosis Date  . CAD (coronary artery disease)   . Cancer (  [place])    basal cell excision upper right ear 7 yrs ago  . Complication of anesthesia    bad pain with anesthesia induction with colonscopy 6 yrs ago  . Diabetes mellitus without complication ([place])    type 2  . GERD (gastroesophageal reflux  disease)   . Hyperlipidemia   . Hypertension   . Peripheral vascular disease ([place])    partial clogged artery right leg  . Sleep apnea    yrs ago sleep study no cpap used    Past Surgical History:  Procedure Laterality Date  . ABDOMINAL AORTOGRAM W/LOWER EXTREMITY N/A 12/27/2016   Procedure: Abdominal Aortogram w/Lower Extremity;  Surgeon: Angelia Mould, MD;  Location: [facility];  Service: Cardiovascular;  Laterality: N/A;  . CARDIAC SURGERY    . CORONARY ANGIOPLASTY WITH STENT PLACEMENT  11/2004   in [place]  . MENISCUS REPAIR Right   . NASAL SINUS SURGERY  1976  . TONSILLECTOMY      Social History   Tobacco Use  . Smoking status: Former Smoker    Packs/day: 1.50    Years: 26.00    Pack years: 39.00    Types: Cigarettes    Quit date: 1994    Years since quitting: 27.6  . Smokeless tobacco: Former Network engineer  . Vaping Use: Never used  Substance Use Topics  . Alcohol use: Yes    Alcohol/week: 0.0 standard drinks    Comment: 3 ounces on monday  . Drug use: No    Family History  Problem Relation Age of Onset  . Alcohol abuse Brother   . Cancer Brother   . Diabetes Brother   . Hyperlipidemia Brother   . Hypertension Brother   . Diabetes Mother   . Hypertension Mother   . Hyperlipidemia Mother   . Heart attack Mother   . Hyperlipidemia Father   . Hypertension Father     Allergies  Allergen Reactions  . Dexilant [Dexlansoprazole]     Pressure in stomach and chest    Medication list has been reviewed and updated.  Current Outpatient Medications on File Prior to Visit  Medication Sig Dispense Refill  . amLODipine (NORVASC) 10 MG tablet TAKE 1 TABLET BY MOUTH EVERY DAY 90 tablet 3  . aspirin 81 MG tablet Take 81 mg by mouth daily.    Marland Kitchen atorvastatin (LIPITOR) 80 MG tablet TAKE 1 TABLET BY MOUTH EVERY DAY 90 tablet 3  . Blood Glucose Monitoring Suppl (FREESTYLE LITE) DEVI CHECK BLOOD SUGAR ONCE DAILY  0  . Blood Glucose Monitoring Suppl  (FREESTYLE LITE) DEVI CHECK BLOOD SUGAR ONCE DAILY 1 each 0  . busPIRone (BUSPAR) 15 MG tablet Take 1 tablet (15 mg total) by mouth 2 (two) times daily. (Patient taking differently: Take 10 mg by mouth 2 (two) times daily. ) 180 tablet 3  . Cholecalciferol (VITAMIN D3) 2000 units TABS Take 2,000 Units by mouth daily.    . cloNIDine (CATAPRES) 0.2 MG tablet Take 1 tablet (0.2 mg total) by mouth 2 (two) times daily. 180 tablet 3  . Coenzyme Q10 300 MG CAPS Take 300 mg by mouth daily.     . Cyanocobalamin (VITAMIN B-12 PO) Take 2,500 mg by mouth daily.    . Diclofenac Sodium 1.5 % SOLN Apply 40 drops topically 4 (four) times daily as needed. 150 mL 3  . glucose blood (FREESTYLE LITE) test strip Check blood sugar 1-3x daily 100 each 12  . glucose blood (FREESTYLE LITE) test  strip CHECK BLOOD SUGAR ONCE DAILY 100 each 2  . Lancets (FREESTYLE) lancets Check blood sugar once daily 100 each 12  . lisinopril (ZESTRIL) 40 MG tablet Take 1.5 tablets (60 mg total) by mouth daily. 135 tablet 3  . metFORMIN (GLUCOPHAGE) 1000 MG tablet TAKE 1 TABLET (1,000 MG TOTAL) BY MOUTH 2 (TWO) TIMES DAILY WITH A MEAL. 180 tablet 3  . MULTIPLE VITAMIN PO Take 1 tablet by mouth daily.     . niacin 500 MG tablet Take 500 mg by mouth every morning.     . Omega-3 Fatty Acids (EQL OMEGA 3 FISH OIL) 1000 MG CAPS Take 1 capsule by mouth 2 (two) times daily.    [name] VERIO test strip USE TO TEST BLOOD SUGAR 1-3 TIMES DAILY 100 strip 3  . pantoprazole (PROTONIX) 40 MG tablet TAKE 1 TABLET BY MOUTH EVERY DAY 90 tablet 1  . prednisoLONE acetate (PRED FORTE) 1 % ophthalmic suspension INSTILL 1 DROP INTO LEFT EYE TWICE A DAY    . Probiotic Product (PROBIOTIC ADVANCED PO) Take by mouth every evening. costco brand takes 2 tabs    . spironolactone (ALDACTONE) 25 MG tablet Take 1 tablet (25 mg total) by mouth daily. 90 tablet 2  . UNABLE TO FIND Take 500 mcg by mouth daily. Med Name: Vitamin K2    . UNABLE TO FIND Med Name: Natural  MK-7-100 MCG WITH MK-4-100MG    . carvedilol (COREG) 25 MG tablet Take 1 tablet (25 mg total) by mouth 2 (two) times daily. 180 tablet 3   No current facility-administered medications on file prior to visit.    Review of Systems:  As per HPI- otherwise negative.   Physical Examination: Vitals:   06/18/20 0900  BP: 130/80  Pulse: 73  Resp: 17  Temp: 98.1 F (36.7 C)  SpO2: 98%   Vitals:   06/18/20 0900  Weight: 282 lb (127.9 kg)  Height: '5\' 9"'  (1.753 m)   Body mass index is 41.64 kg/m. Ideal Body Weight: Weight in (lb) to have BMI = 25: 168.9  GEN: no acute distress.  Obese, otherwise looks well HEENT: Atraumatic, Normocephalic.   Bilateral TM wnl, oropharynx normal.  PEERL,EOMI.   Ears and Nose: No external deformity. CV: RRR, No M/G/R. No JVD. No thrill. No extra heart sounds. PULM: CTA B, no wheezes, crackles, rhonchi. No retractions. No resp. distress. No accessory muscle use. ABD: S, NT, ND, +BS. No rebound. No HSM. EXTR: No c/c/e PSYCH: Normally interactive. Conversant.    Assessment and Plan: Essential hypertension - Plan: Basic metabolic panel, CANCELED: Basic metabolic panel  Type 2 diabetes mellitus without complication, without long-term current use of insulin (HCC) - Plan: Hemoglobin A1c, CANCELED: Hemoglobin A1c  Dyslipidemia - Plan: Lipid panel, CANCELED: Lipid panel  PAD (peripheral artery disease) ([place]) - Plan: CBC, CANCELED: CBC  Chronic left shoulder pain - Plan: meloxicam (MOBIC) 15 MG tablet  Patient here today for recheck visit Blood pressure under good control on current regimen Labs are pending as above to check on diabetes and lipids Peripheral arterial disease is stable, he continues to exercise as he is able Patient uses meloxicam as needed for shoulder pain, refill today This visit occurred during the SARS-CoV-2 public health emergency.  Safety protocols were in place, including screening questions prior to the visit, additional usage  of staff PPE, and extensive cleaning of exam room while observing appropriate contact time as indicated for disinfecting solutions.    Signed Lamar Blinks, MD  Received his labs 8/26-message to patient  Results for orders placed or performed in visit on 71/69/67  Basic metabolic panel  Result Value Ref Range   Glucose, Bld 126 (H) 65 - 99 mg/dL   BUN 13 7 - 25 mg/dL   Creat 0.84 0.70 - 1.25 mg/dL   BUN/Creatinine Ratio NOT APPLICABLE 6 - 22 (calc)   Sodium 135 135 - 146 mmol/L   Potassium 5.0 3.5 - 5.3 mmol/L   Chloride 100 98 - 110 mmol/L   CO2 27 20 - 32 mmol/L   Calcium 9.4 8.6 - 10.3 mg/dL  CBC  Result Value Ref Range   WBC 6.0 3.8 - 10.8 Thousand/uL   RBC 4.46 4.20 - 5.80 Million/uL   Hemoglobin 13.1 (L) 13.2 - 17.1 g/dL   HCT 38.3 (L) 38 - 50 %   MCV 85.9 80.0 - 100.0 fL   MCH 29.4 27.0 - 33.0 pg   MCHC 34.2 32.0 - 36.0 g/dL   RDW 13.3 11.0 - 15.0 %   Platelets 173 140 - 400 Thousand/uL   MPV 11.4 7.5 - 12.5 fL  Hemoglobin A1c  Result Value Ref Range   Hgb A1c MFr Bld 6.9 (H) <5.7 % of total Hgb   Mean Plasma Glucose 151 (calc)   eAG (mmol/L) 8.4 (calc)  Lipid panel  Result Value Ref Range   Cholesterol 123 <200 mg/dL   HDL 31 (L) > OR = 40 mg/dL   Triglycerides 186 (H) <150 mg/dL   LDL Cholesterol (Calc) 67 mg/dL (calc)   Total CHOL/HDL Ratio 4.0 <5.0 (calc)   Non-HDL Cholesterol (Calc) 92 <130 mg/dL (calc)   Metabolic profile is normal Blood counts are normal except for minimal anemia A1c is improved from previous, shows good control of diabetes Cholesterol is overall good, I would like to see your HDL higher. Please add an omega-3 supplement if not taking already  I do notice that your colon cancer screening is due next year. Sometimes mild anemia can be a warning sign of slow blood loss through the colon. Please be absolutely sure to get your colonoscopy within the next year-we could also order a Cologuard kit for you. This is an at home screening kit  which can be performed for people at average risk for colon cancer (people without personal or close family history of colon cancer/personal history of polyps). Let me know if you would like me to order a Cologuard kit to be sent to you  COVID-19 hit close to home for me today. If you do find yourself getting sick, please be tested right away. Even if you are unvaccinated, we can treat you with monoclonal antibody IV-if used early this can be lifesaving for high risk persons like yourself  Take care, please see me in 6 months

## 2020-06-19 ENCOUNTER — Encounter: Payer: Self-pay | Admitting: Family Medicine

## 2020-06-19 LAB — CBC
HCT: 38.3 % — ABNORMAL LOW (ref 38.5–50.0)
Hemoglobin: 13.1 g/dL — ABNORMAL LOW (ref 13.2–17.1)
MCH: 29.4 pg (ref 27.0–33.0)
MCHC: 34.2 g/dL (ref 32.0–36.0)
MCV: 85.9 fL (ref 80.0–100.0)
MPV: 11.4 fL (ref 7.5–12.5)
Platelets: 173 10*3/uL (ref 140–400)
RBC: 4.46 10*6/uL (ref 4.20–5.80)
RDW: 13.3 % (ref 11.0–15.0)
WBC: 6 10*3/uL (ref 3.8–10.8)

## 2020-06-19 LAB — BASIC METABOLIC PANEL
BUN: 13 mg/dL (ref 7–25)
CO2: 27 mmol/L (ref 20–32)
Calcium: 9.4 mg/dL (ref 8.6–10.3)
Chloride: 100 mmol/L (ref 98–110)
Creat: 0.84 mg/dL (ref 0.70–1.25)
Glucose, Bld: 126 mg/dL — ABNORMAL HIGH (ref 65–99)
Potassium: 5 mmol/L (ref 3.5–5.3)
Sodium: 135 mmol/L (ref 135–146)

## 2020-06-19 LAB — LIPID PANEL
Cholesterol: 123 mg/dL (ref <200)
HDL: 31 mg/dL — ABNORMAL LOW (ref >=40)
LDL Cholesterol (Calc): 67 mg/dL (calc)
Non-HDL Cholesterol (Calc): 92 mg/dL (calc) (ref <130)
Total CHOL/HDL Ratio: 4 (calc) (ref <5.0)
Triglycerides: 186 mg/dL — ABNORMAL HIGH (ref <150)

## 2020-06-19 LAB — HEMOGLOBIN A1C
Hgb A1c MFr Bld: 6.9 % of total Hgb — ABNORMAL HIGH (ref <5.7)
Mean Plasma Glucose: 151 (calc)
eAG (mmol/L): 8.4 (calc)

## 2020-06-20 NOTE — Telephone Encounter (Signed)
Cologuard ordered for patient.  

## 2020-06-23 DIAGNOSIS — G4733 Obstructive sleep apnea (adult) (pediatric): Secondary | ICD-10-CM | POA: Diagnosis not present

## 2020-06-25 ENCOUNTER — Encounter: Payer: Self-pay | Admitting: Cardiology

## 2020-06-25 ENCOUNTER — Other Ambulatory Visit: Payer: Self-pay

## 2020-06-25 ENCOUNTER — Ambulatory Visit: Payer: Medicare HMO | Admitting: Cardiology

## 2020-06-25 VITALS — BP 108/60 | HR 70 | Ht 69.0 in | Wt 283.0 lb

## 2020-06-25 DIAGNOSIS — I739 Peripheral vascular disease, unspecified: Secondary | ICD-10-CM

## 2020-06-25 DIAGNOSIS — I10 Essential (primary) hypertension: Secondary | ICD-10-CM

## 2020-06-25 DIAGNOSIS — E7800 Pure hypercholesterolemia, unspecified: Secondary | ICD-10-CM

## 2020-06-25 DIAGNOSIS — I2510 Atherosclerotic heart disease of native coronary artery without angina pectoris: Secondary | ICD-10-CM | POA: Diagnosis not present

## 2020-06-25 NOTE — Patient Instructions (Signed)
Medication Instructions:  NO CHANGE *If you need a refill on your cardiac medications before your next appointment, please call your pharmacy*   Lab Work: If you have labs (blood work) drawn today and your tests are completely normal, you will receive your results only by: Marland Kitchen MyChart Message (if you have MyChart) OR . A paper copy in the mail If you have any lab test that is abnormal or we need to change your treatment, we will call you to review the results   Follow-Up: At [facility], you and your health needs are our priority.  As part of our continuing mission to provide you with exceptional heart care, we have created designated Provider Care Teams.  These Care Teams include your primary Cardiologist (physician) and Advanced Practice Providers (APPs -  Physician Assistants and Nurse Practitioners) who all work together to provide you with the care you need, when you need it.  We recommend signing up for the patient portal called "MyChart".  Sign up information is provided on this After Visit Summary.  MyChart is used to connect with patients for Virtual Visits (Telemedicine).  Patients are able to view lab/test results, encounter notes, upcoming appointments, etc.  Non-urgent messages can be sent to your provider as well.   To learn more about what you can do with MyChart, go to [email or web address].    Your next appointment:   12 month(s)  The format for your next appointment:   In Person  Provider:   Kirk Ruths, MD

## 2020-07-03 DIAGNOSIS — Z1211 Encounter for screening for malignant neoplasm of colon: Secondary | ICD-10-CM | POA: Diagnosis not present

## 2020-07-03 LAB — COLOGUARD: Cologuard: NEGATIVE

## 2020-07-04 ENCOUNTER — Other Ambulatory Visit: Payer: Self-pay | Admitting: Family Medicine

## 2020-07-04 DIAGNOSIS — E781 Pure hyperglyceridemia: Secondary | ICD-10-CM

## 2020-07-04 DIAGNOSIS — E785 Hyperlipidemia, unspecified: Secondary | ICD-10-CM

## 2020-07-07 ENCOUNTER — Encounter: Payer: Self-pay | Admitting: Family Medicine

## 2020-07-07 DIAGNOSIS — H9313 Tinnitus, bilateral: Secondary | ICD-10-CM

## 2020-07-10 LAB — COLOGUARD: COLOGUARD: NEGATIVE

## 2020-07-10 LAB — EXTERNAL GENERIC LAB PROCEDURE: COLOGUARD: NEGATIVE

## 2020-07-14 ENCOUNTER — Encounter: Payer: Self-pay | Admitting: Family Medicine

## 2020-07-16 ENCOUNTER — Encounter: Payer: Self-pay | Admitting: Family Medicine

## 2020-07-28 DIAGNOSIS — D1801 Hemangioma of skin and subcutaneous tissue: Secondary | ICD-10-CM | POA: Diagnosis not present

## 2020-07-28 DIAGNOSIS — D692 Other nonthrombocytopenic purpura: Secondary | ICD-10-CM | POA: Diagnosis not present

## 2020-07-28 DIAGNOSIS — Z85828 Personal history of other malignant neoplasm of skin: Secondary | ICD-10-CM | POA: Diagnosis not present

## 2020-07-28 DIAGNOSIS — L821 Other seborrheic keratosis: Secondary | ICD-10-CM | POA: Diagnosis not present

## 2020-07-28 DIAGNOSIS — D229 Melanocytic nevi, unspecified: Secondary | ICD-10-CM | POA: Diagnosis not present

## 2020-07-28 DIAGNOSIS — L918 Other hypertrophic disorders of the skin: Secondary | ICD-10-CM | POA: Diagnosis not present

## 2020-08-08 DIAGNOSIS — Z7982 Long term (current) use of aspirin: Secondary | ICD-10-CM | POA: Diagnosis not present

## 2020-08-08 DIAGNOSIS — I739 Peripheral vascular disease, unspecified: Secondary | ICD-10-CM | POA: Diagnosis not present

## 2020-08-08 DIAGNOSIS — I10 Essential (primary) hypertension: Secondary | ICD-10-CM | POA: Diagnosis not present

## 2020-08-08 DIAGNOSIS — Z87891 Personal history of nicotine dependence: Secondary | ICD-10-CM | POA: Diagnosis not present

## 2020-08-08 DIAGNOSIS — E785 Hyperlipidemia, unspecified: Secondary | ICD-10-CM | POA: Diagnosis not present

## 2020-09-01 ENCOUNTER — Other Ambulatory Visit: Payer: Self-pay | Admitting: Family Medicine

## 2020-09-01 DIAGNOSIS — I10 Essential (primary) hypertension: Secondary | ICD-10-CM

## 2020-09-19 ENCOUNTER — Encounter: Payer: Self-pay | Admitting: Family Medicine

## 2020-09-20 NOTE — Telephone Encounter (Signed)
Called pt- his finger seems better today, pus drained (?[place]) He will let me know if he needs to be seen and we can get him in Monday if needed

## 2020-10-13 ENCOUNTER — Other Ambulatory Visit: Payer: Self-pay | Admitting: Family Medicine

## 2020-10-13 DIAGNOSIS — K219 Gastro-esophageal reflux disease without esophagitis: Secondary | ICD-10-CM

## 2020-10-20 ENCOUNTER — Encounter: Payer: Self-pay | Admitting: Family Medicine

## 2020-10-28 ENCOUNTER — Other Ambulatory Visit: Payer: Self-pay

## 2020-10-28 NOTE — Progress Notes (Signed)
[place] at [facility] [address], [place], [place] [address] [phone]  Date:  10/29/2020   Name:  David Singh   DOB:  05/22/1951   MRN:  [id number]  PCP:  Darreld Mclean, MD    Chief Complaint: No chief complaint on file.   History of Present Illness:  David Singh is a 70 y.o. very pleasant male patient who presents with the following:  Patient here today with GI concern-we agreed on a virtual visit as patient is very high risk and remains unvaccinated against COVID-19.  Patient location is home, my location is office.  Patient identity is confirmed 2 factors, he gives consent for virtual visit today.  The patient myself are present on the call today Last seen by myself in August of this year- History of diabetes, CAD with stent, hyperlipidemia, hypertension, significant peripheral arterial disease  He is followed by endocrinology, Dr. Posey Pronto at [facility]- not done  Flu vaccine done  Most recent A1c in August looked good, 6.9%  Pt notes he has had diarrhea which has been present off and on for about 6 months He will sometimes have solid stools for couple of days, but then diarrhea always returns  No nausea or vomiting No fever No recent antibiotic use No exposure to GI illness that he is aware of He has not had this in the past He has lost a bit of weight but this is intentional He had abd pain for 3-4 days- it was never severe He had pain from 10/26/20 until yesterday-this now seems to have resolved  No blood in his stools or black stools No mucus in his stools   He did cologuard last year which was negative  He did do a colonoscopy in [place] several years ago - it as normal as far as he can recall He did a colonoscopy about 3 x, then decide to change over to Cologuard for convenience He will see Dr Posey Pronto next week-he plans to see him in person as an A1c will be  necessary  The diarrhea will hit him fast and he has had a few accidents  Imodium does help when he takes it    Patient Active Problem List   Diagnosis Date Noted  . Primary osteoarthritis of right knee 03/03/2020  . Chronic gout 03/09/2016  . Hypertriglyceridemia 01/16/2015  . Type 2 diabetes mellitus ([place]) 01/06/2015  . CAD in native artery 01/06/2015  . History of coronary artery stent placement 01/06/2015  . PAD (peripheral artery disease) ([place]) 01/06/2015  . Hyperlipidemia 01/06/2015  . Essential hypertension 01/06/2015    Past Medical History:  Diagnosis Date  . CAD (coronary artery disease)   . Cancer ([place])    basal cell excision upper right ear 7 yrs ago  . Complication of anesthesia    bad pain with anesthesia induction with colonscopy 6 yrs ago  . Diabetes mellitus without complication ([place])    type 2  . GERD (gastroesophageal reflux disease)   . Hyperlipidemia   . Hypertension   . Peripheral vascular disease ([place])    partial clogged artery right leg  . Sleep apnea    yrs ago sleep study no cpap used    Past Surgical History:  Procedure Laterality Date  . ABDOMINAL AORTOGRAM W/LOWER EXTREMITY N/A 12/27/2016   Procedure: Abdominal Aortogram w/Lower Extremity;  Surgeon: Angelia Mould, MD;  Location: [facility];  Service: Cardiovascular;  Laterality: N/A;  . CARDIAC SURGERY    . CORONARY ANGIOPLASTY WITH STENT PLACEMENT  11/2004   in [place]  . MENISCUS REPAIR Right   . NASAL SINUS SURGERY  1976  . TONSILLECTOMY      Social History   Tobacco Use  . Smoking status: Former Smoker    Packs/day: 1.50    Years: 26.00    Pack years: 39.00    Types: Cigarettes    Quit date: 1994    Years since quitting: 28.0  . Smokeless tobacco: Former Network engineer  . Vaping Use: Never used  Substance Use Topics  . Alcohol use: Yes    Alcohol/week: 0.0 standard drinks    Comment: 3 ounces on monday  . Drug use: No    Family History  Problem  Relation Age of Onset  . Alcohol abuse Brother   . Cancer Brother   . Diabetes Brother   . Hyperlipidemia Brother   . Hypertension Brother   . Diabetes Mother   . Hypertension Mother   . Hyperlipidemia Mother   . Heart attack Mother   . Hyperlipidemia Father   . Hypertension Father     Allergies  Allergen Reactions  . Dexilant [Dexlansoprazole]     Pressure in stomach and chest    Medication list has been reviewed and updated.  Current Outpatient Medications on File Prior to Visit  Medication Sig Dispense Refill  . amLODipine (NORVASC) 10 MG tablet TAKE 1 TABLET EVERY DAY 90 tablet 3  . aspirin 81 MG tablet Take 81 mg by mouth daily.    Marland Kitchen atorvastatin (LIPITOR) 80 MG tablet TAKE 1 TABLET BY MOUTH EVERY DAY 90 tablet 3  . Blood Glucose Monitoring Suppl (FREESTYLE LITE) DEVI CHECK BLOOD SUGAR ONCE DAILY  0  . Blood Glucose Monitoring Suppl (FREESTYLE LITE) DEVI CHECK BLOOD SUGAR ONCE DAILY 1 each 0  . busPIRone (BUSPAR) 15 MG tablet Take 1 tablet (15 mg total) by mouth 2 (two) times daily. (Patient taking differently: Take 10 mg by mouth 2 (two) times daily. ) 180 tablet 3  . carvedilol (COREG) 25 MG tablet Take 1 tablet (25 mg total) by mouth 2 (two) times daily. 180 tablet 3  . Cholecalciferol (VITAMIN D3) 2000 units TABS Take 2,000 Units by mouth daily.    . cloNIDine (CATAPRES) 0.2 MG tablet Take 1 tablet (0.2 mg total) by mouth 2 (two) times daily. 180 tablet 3  . Coenzyme Q10 300 MG CAPS Take 300 mg by mouth daily.     . Cyanocobalamin (VITAMIN B-12 PO) Take 2,500 mg by mouth daily.    . Diclofenac Sodium 1.5 % SOLN Apply 40 drops topically 4 (four) times daily as needed. 150 mL 3  . glucose blood (FREESTYLE LITE) test strip Check blood sugar 1-3x daily 100 each 12  . glucose blood (FREESTYLE LITE) test strip CHECK BLOOD SUGAR ONCE DAILY 100 each 2  . Lancets (FREESTYLE) lancets Check blood sugar once daily 100 each 12  . lisinopril (ZESTRIL) 40 MG tablet Take 1.5 tablets  (60 mg total) by mouth daily. 135 tablet 3  . meloxicam (MOBIC) 15 MG tablet Take 1 tablet (15 mg total) by mouth daily. 90 tablet 1  . metFORMIN (GLUCOPHAGE) 1000 MG tablet TAKE 1 TABLET (1,000 MG TOTAL) BY MOUTH 2 (TWO) TIMES DAILY WITH A MEAL. 180 tablet 3  . MULTIPLE VITAMIN PO Take 1 tablet by mouth daily.     . niacin 500 MG  tablet Take 500 mg by mouth every morning.     . Omega-3 Fatty Acids (EQL OMEGA 3 FISH OIL) 1000 MG CAPS Take 1 capsule by mouth 2 (two) times daily.    [name] VERIO test strip USE TO TEST BLOOD SUGAR 1-3 TIMES DAILY 100 strip 3  . pantoprazole (PROTONIX) 40 MG tablet Take 1 tablet (40 mg total) by mouth daily. 90 tablet 3  . prednisoLONE acetate (PRED FORTE) 1 % ophthalmic suspension INSTILL 1 DROP INTO LEFT EYE TWICE A DAY    . Probiotic Product (PROBIOTIC ADVANCED PO) Take by mouth every evening. costco brand takes 2 tabs    . spironolactone (ALDACTONE) 25 MG tablet Take 1 tablet (25 mg total) by mouth daily. 90 tablet 2  . UNABLE TO FIND Take 500 mcg by mouth daily. Med Name: Vitamin K2    . UNABLE TO FIND Med Name: Natural MK-7-100 MCG WITH MK-4-100MG     No current facility-administered medications on file prior to visit.    Review of Systems:  As per HPI- otherwise negative.   Physical Examination: There were no vitals filed for this visit. There were no vitals filed for this visit. There is no height or weight on file to calculate BMI. Ideal Body Weight:    Patient is obese, otherwise looks well.  No distress or shortness of breath is noted He has been checking his BP at home- it has looked ok, no fever noted He does have a pulse ox- his sats are 97%   Assessment and Plan: Diarrhea, unspecified type - Plan: Clostridium Difficile by PCR([facility]), CANCELED: Clostridium Difficile by PCR([facility])  Virtual visit today to discuss persistent diarrhea.  I connected with the patient via video, video used for the entirety of visit  today [name] has noted intermittently persistent diarrhea for about 6 months We will first need to rule out C. difficile, the patient will come by clinic later today and we will bring a C. difficile test kit out to his car.  Other infectious etiology is less likely due to duration of symptoms He does plan to see his endocrinologist in person next week, I will ask Dr. Posey Pronto to please run a CBC, CMP, amylase and lipase for me If all this is unrevealing we may plan for GI consultation He will let me know if getting worse or if symptoms change in the interim This visit occurred during the SARS-CoV-2 public health emergency.  Safety protocols were in place, including screening questions prior to the visit, additional usage of staff PPE, and extensive cleaning of exam room while observing appropriate contact time as indicated for disinfecting solutions.    Signed Lamar Blinks, MD

## 2020-10-28 NOTE — Patient Instructions (Signed)
It was good to see you again today!   

## 2020-10-29 ENCOUNTER — Encounter: Payer: Self-pay | Admitting: Family Medicine

## 2020-10-29 ENCOUNTER — Telehealth (INDEPENDENT_AMBULATORY_CARE_PROVIDER_SITE_OTHER): Payer: Medicare HMO | Admitting: Family Medicine

## 2020-10-29 DIAGNOSIS — R197 Diarrhea, unspecified: Secondary | ICD-10-CM

## 2020-10-31 ENCOUNTER — Other Ambulatory Visit: Payer: Self-pay

## 2020-10-31 ENCOUNTER — Other Ambulatory Visit (INDEPENDENT_AMBULATORY_CARE_PROVIDER_SITE_OTHER): Payer: Medicare HMO

## 2020-10-31 DIAGNOSIS — R197 Diarrhea, unspecified: Secondary | ICD-10-CM | POA: Diagnosis not present

## 2020-11-01 LAB — CLOSTRIDIUM DIFFICILE BY PCR: Toxigenic C. Difficile by PCR: NEGATIVE

## 2020-11-02 ENCOUNTER — Encounter: Payer: Self-pay | Admitting: Family Medicine

## 2020-11-04 ENCOUNTER — Other Ambulatory Visit: Payer: Self-pay | Admitting: Family Medicine

## 2020-11-04 ENCOUNTER — Encounter: Payer: Self-pay | Admitting: Family Medicine

## 2020-11-04 DIAGNOSIS — E119 Type 2 diabetes mellitus without complications: Secondary | ICD-10-CM

## 2020-11-04 DIAGNOSIS — R197 Diarrhea, unspecified: Secondary | ICD-10-CM

## 2020-11-04 DIAGNOSIS — F411 Generalized anxiety disorder: Secondary | ICD-10-CM

## 2020-11-05 DIAGNOSIS — E782 Mixed hyperlipidemia: Secondary | ICD-10-CM | POA: Diagnosis not present

## 2020-11-05 DIAGNOSIS — Z6841 Body Mass Index (BMI) 40.0 and over, adult: Secondary | ICD-10-CM | POA: Diagnosis not present

## 2020-11-05 DIAGNOSIS — I10 Essential (primary) hypertension: Secondary | ICD-10-CM | POA: Diagnosis not present

## 2020-11-05 DIAGNOSIS — E1151 Type 2 diabetes mellitus with diabetic peripheral angiopathy without gangrene: Secondary | ICD-10-CM | POA: Diagnosis not present

## 2020-11-05 NOTE — Addendum Note (Signed)
Addended by: Lamar Blinks C on: 11/05/2020 07:21 PM   Modules accepted: Orders

## 2020-11-07 DIAGNOSIS — E1151 Type 2 diabetes mellitus with diabetic peripheral angiopathy without gangrene: Secondary | ICD-10-CM | POA: Diagnosis not present

## 2020-11-07 DIAGNOSIS — I10 Essential (primary) hypertension: Secondary | ICD-10-CM | POA: Diagnosis not present

## 2020-11-07 DIAGNOSIS — E782 Mixed hyperlipidemia: Secondary | ICD-10-CM | POA: Diagnosis not present

## 2020-11-11 MED ORDER — ROSUVASTATIN CALCIUM 40 MG PO TABS: 40.0000 mg | ORAL_TABLET | Freq: Every day | ORAL | 3 refills | Status: DC

## 2020-11-11 NOTE — Addendum Note (Signed)
Addended by: Lamar Blinks C on: 11/11/2020 12:48 PM   Modules accepted: Orders

## 2020-11-12 ENCOUNTER — Encounter: Payer: Self-pay | Admitting: Gastroenterology

## 2020-11-13 ENCOUNTER — Encounter: Payer: Self-pay | Admitting: Gastroenterology

## 2020-11-18 ENCOUNTER — Other Ambulatory Visit: Payer: Self-pay | Admitting: Cardiology

## 2020-12-01 ENCOUNTER — Other Ambulatory Visit (INDEPENDENT_AMBULATORY_CARE_PROVIDER_SITE_OTHER): Payer: Medicare HMO

## 2020-12-01 ENCOUNTER — Ambulatory Visit: Payer: Medicare HMO | Admitting: Gastroenterology

## 2020-12-01 ENCOUNTER — Other Ambulatory Visit: Payer: Self-pay

## 2020-12-01 ENCOUNTER — Encounter: Payer: Self-pay | Admitting: Gastroenterology

## 2020-12-01 VITALS — BP 118/78 | HR 97 | Ht 69.0 in | Wt 284.4 lb

## 2020-12-01 DIAGNOSIS — R194 Change in bowel habit: Secondary | ICD-10-CM

## 2020-12-01 DIAGNOSIS — R197 Diarrhea, unspecified: Secondary | ICD-10-CM

## 2020-12-01 DIAGNOSIS — Z6841 Body Mass Index (BMI) 40.0 and over, adult: Secondary | ICD-10-CM

## 2020-12-01 DIAGNOSIS — K219 Gastro-esophageal reflux disease without esophagitis: Secondary | ICD-10-CM

## 2020-12-01 DIAGNOSIS — I10 Essential (primary) hypertension: Secondary | ICD-10-CM

## 2020-12-01 LAB — TSH: TSH: 1.13 u[IU]/mL (ref 0.35–4.50)

## 2020-12-01 LAB — C-REACTIVE PROTEIN: CRP: <1 mg/dL (ref 0.5–20.0)

## 2020-12-01 LAB — CBC
HCT: 38.1 % — ABNORMAL LOW (ref 39.0–52.0)
Hemoglobin: 13.1 g/dL (ref 13.0–17.0)
MCHC: 34.4 g/dL (ref 30.0–36.0)
MCV: 86.3 fl (ref 78.0–100.0)
Platelets: 177 10*3/uL (ref 150.0–400.0)
RBC: 4.42 Mil/uL (ref 4.22–5.81)
RDW: 14.1 % (ref 11.5–15.5)
WBC: 5.9 10*3/uL (ref 4.0–10.5)

## 2020-12-01 LAB — SEDIMENTATION RATE: Sed Rate: 16 mm/hr (ref 0–20)

## 2020-12-01 NOTE — Progress Notes (Signed)
Chief Complaint: Diarrhea   Referring Provider:     Darreld Mclean, MD   HPI:     David Singh is a 70 y.o. male with history of diabetes, HTN, HLD, PVD, OSA, CAD, BCC, GERD, obesity (BMI 42), referred to the [facility] for evaluation of diarrhea.  He reports intermittent nonbloody diarrhea/loose stools for the last 6 months or so.  Can have a few days of normal stools, the diarrhea returns.  No preceding antibiotics, infection, sick contacts, change in medications, etc.  Will use Imodium prn with good response. No abdominal pain, n/v/f/c. Has had episodes of urge incontinence. No fecal seepage. No nocturnal stools.   Has had a similar episode a few years ago, which was treated by Dr. Alice Reichert with unknonw mediaiton with reoslution (only note was 2015 and only reported GERD, treated with Pantoprazole).   Has had intentional weight loss.  No night sweats.  Hx of GERD, well controlled with Protonix.   -10/2020: C. difficile negative -Cologuard negative in 06/2020 -Has had colonoscopy x3 in the past, with last colonoscopy in [place] many years ago.  Reportedly all normal per patient. -EGD in [place] which was only n/f mild inflammation per patient  Past Medical History:  Diagnosis Date  . CAD (coronary artery disease)   . Cancer ([place])    basal cell excision upper right ear 7 yrs ago  . Complication of anesthesia    bad pain with anesthesia induction with colonscopy 6 yrs ago  . Diabetes mellitus without complication ([place])    type 2  . GERD (gastroesophageal reflux disease)   . Hyperlipidemia   . Hypertension   . Peripheral vascular disease ([place])    partial clogged artery right leg  . Sleep apnea    yrs ago sleep study no cpap used     Past Surgical History:  Procedure Laterality Date  . ABDOMINAL AORTOGRAM W/LOWER EXTREMITY N/A 12/27/2016   Procedure: Abdominal Aortogram w/Lower Extremity;  Surgeon: Angelia Mould, MD;  Location: [facility];  Service: Cardiovascular;  Laterality: N/A;  . CARDIAC SURGERY    . CORONARY ANGIOPLASTY WITH STENT PLACEMENT  11/2004   in [place]  . MENISCUS REPAIR Right   . NASAL SINUS SURGERY  1976  . TONSILLECTOMY     Family History  Problem Relation Age of Onset  . Alcohol abuse Brother   . Cancer Brother   . Diabetes Brother   . Hyperlipidemia Brother   . Hypertension Brother   . Diabetes Mother   . Hypertension Mother   . Hyperlipidemia Mother   . Heart attack Mother   . Hyperlipidemia Father   . Hypertension Father    Social History   Tobacco Use  . Smoking status: Former Smoker    Packs/day: 1.50    Years: 26.00    Pack years: 39.00    Types: Cigarettes    Quit date: 1994    Years since quitting: 28.1  . Smokeless tobacco: Former Network engineer  . Vaping Use: Never used  Substance Use Topics  . Alcohol use: Yes    Alcohol/week: 0.0 standard drinks    Comment: 3 ounces on monday  . Drug use: No   Current Outpatient Medications  Medication Sig Dispense Refill  . amLODipine (NORVASC) 10 MG tablet TAKE 1 TABLET EVERY DAY 90 tablet 3  . aspirin 81 MG tablet Take 81 mg by mouth  daily.    . Blood Glucose Monitoring Suppl (FREESTYLE LITE) DEVI CHECK BLOOD SUGAR ONCE DAILY  0  . Blood Glucose Monitoring Suppl (FREESTYLE LITE) DEVI CHECK BLOOD SUGAR ONCE DAILY 1 each 0  . busPIRone (BUSPAR) 15 MG tablet Take 1 tablet (15 mg total) by mouth 2 (two) times daily. 180 tablet 0  . carvedilol (COREG) 25 MG tablet TAKE 1 TABLET BY MOUTH TWICE A DAY 180 tablet 3  . Cholecalciferol (VITAMIN D3) 2000 units TABS Take 2,000 Units by mouth daily.    . cloNIDine (CATAPRES) 0.2 MG tablet Take 1 tablet (0.2 mg total) by mouth 2 (two) times daily. 180 tablet 3  . Coenzyme Q10 300 MG CAPS Take 300 mg by mouth daily.     . Cyanocobalamin (VITAMIN B-12 PO) Take 2,500 mg by mouth daily.    Marland Kitchen glucose blood (FREESTYLE LITE) test strip Check blood sugar 1-3x daily 100 each 12  .  Lancets (FREESTYLE) lancets Check blood sugar once daily 100 each 12  . lisinopril (ZESTRIL) 40 MG tablet Take 1.5 tablets (60 mg total) by mouth daily. 135 tablet 3  . meloxicam (MOBIC) 15 MG tablet Take 1 tablet (15 mg total) by mouth daily. 90 tablet 1  . metFORMIN (GLUCOPHAGE) 1000 MG tablet TAKE 1 TABLET (1,000 MG TOTAL) BY MOUTH 2 (TWO) TIMES DAILY WITH A MEAL. 180 tablet 3  . MULTIPLE VITAMIN PO Take 1 tablet by mouth daily.     . niacin 500 MG tablet Take 500 mg by mouth every morning.     . Omega-3 Fatty Acids (EQL OMEGA 3 FISH OIL) 1000 MG CAPS Take 1 capsule by mouth 2 (two) times daily.    . pantoprazole (PROTONIX) 40 MG tablet Take 1 tablet (40 mg total) by mouth daily. 90 tablet 3  . rosuvastatin (CRESTOR) 40 MG tablet Take 1 tablet (40 mg total) by mouth daily. 90 tablet 3  . Diclofenac Sodium 1.5 % SOLN Apply 40 drops topically 4 (four) times daily as needed. 150 mL 3  . glucose blood (FREESTYLE LITE) test strip CHECK BLOOD SUGAR ONCE DAILY 100 each 2  . ONETOUCH VERIO test strip USE TO TEST BLOOD SUGAR 1-3 TIMES DAILY 100 strip 3  . prednisoLONE acetate (PRED FORTE) 1 % ophthalmic suspension INSTILL 1 DROP INTO LEFT EYE TWICE A DAY (Patient not taking: Reported on 12/01/2020)    . Probiotic Product (PROBIOTIC ADVANCED PO) Take by mouth every evening. [employer] brand takes 2 tabs    . spironolactone (ALDACTONE) 25 MG tablet Take 1 tablet (25 mg total) by mouth daily. 90 tablet 2  . UNABLE TO FIND Take 500 mcg by mouth daily. Med Name: Vitamin K2    . UNABLE TO FIND Med Name: Natural MK-7-100 MCG WITH MK-4-100MG     No current facility-administered medications for this visit.   Allergies  Allergen Reactions  . Dexilant [Dexlansoprazole]     Pressure in stomach and chest     Review of Systems: All systems reviewed and negative except where noted in HPI.     Physical Exam:    Wt Readings from Last 3 Encounters:  12/01/20 284 lb 6 oz (129 kg)  06/25/20 283 lb (128.4 kg)   06/18/20 282 lb (127.9 kg)    BP 118/78 (BP Location: Right Arm, Patient Position: Sitting, Cuff Size: Large)   Pulse 97   Ht '5\' 9"'  (1.753 m)   Wt 284 lb 6 oz (129 kg)   BMI 41.99 kg/m  Constitutional:  Pleasant, in no acute distress. Psychiatric: Normal mood and affect. Behavior is normal. EENT: Pupils normal.  Conjunctivae are normal. No scleral icterus. Neck supple. No cervical LAD. Cardiovascular: Normal rate, regular rhythm. No edema Pulmonary/chest: Effort normal and breath sounds normal. No wheezing, rales or rhonchi. Abdominal: Soft, nondistended, nontender. Bowel sounds active throughout. There are no masses palpable. No hepatomegaly. Neurological: Alert and oriented to person place and time. Skin: Skin is warm and dry. No rashes noted.   ASSESSMENT AND PLAN;   1) Diarrhea 2) Change in bowel habits  -Check GI PCR, fecal calprotectin, ESR, CRP, fecal elastase -Check CBC, TSH -High-fiber diet and start fiber supplement -If symptoms persist and above work-up unrevealing, plan for colonoscopy and possible upper endoscopy -Restart OTC probiotic  3) GERD -Well-controlled on current therapy  4) Obesity (BMI 42)  - RTC in 3-6 months or sooner prn    Lavena Bullion, DO, FACG  12/01/2020, 9:30 AM   Cop[place], Gay Filler, MD

## 2020-12-01 NOTE — Patient Instructions (Addendum)
If you are age 69 or older, your body mass index should be between 23-30. Your Body mass index is 41.99 kg/m. If this is out of the aforementioned range listed, please consider follow up with your Primary Care Provider.  If you are age 5 or younger, your body mass index should be between 19-25. Your Body mass index is 41.99 kg/m. If this is out of the aformentioned range listed, please consider follow up with your Primary Care Provider.   Due to recent changes in healthcare laws, you may see the results of your imaging and laboratory studies on MyChart before your provider has had a chance to review them.  We understand that in some cases there may be results that are confusing or concerning to you. Not all laboratory results come back in the same time frame and the provider may be waiting for multiple results in order to interpret others.  Please give [place] 48 hours in order for your provider to thoroughly review all the results before contacting the office for clarification of your results.    Fiber Content in Foods Fiber is a substance that is found in plant foods, such as fruits, vegetables, whole grains, nuts, seeds, and beans. As part of your treatment and recovery plan, your health care provider may recommend that you eat foods that have specific amounts of dietary fiber. Some conditions may require a high-fiber diet while others may require a low-fiber diet. This sheet gives you information about the dietary fiber content of some common foods. Your health care provider will tell you how much fiber you need in your diet. If you have problems or questions, contact your health care provider or dietitian. What foods are high in fiber? Fruits  Blackberries or raspberries (fresh) --  cup (75 g) has 4 g of fiber.  Pear (fresh) -- 1 medium (180 g) has 5.5 g of fiber.  Prunes (dried) -- 6 to 8 pieces (57-76 g) has 5 g of fiber.  Apple with skin -- 1 medium (182 g) has 4.8 g of fiber.  Guava -- 1  cup (128 g) has 8.9 g of fiber. Vegetables  Peas (frozen) --  cup (80 g) has 4.4 g of fiber.  Potato with skin (baked) -- 1 medium (173 g) has 4.4 g of fiber.  Pumpkin (canned) --  cup (122 g) has 5 g of fiber.  Brussels sprouts (cooked) --  cup (78 g) has 4 g of fiber.  Sweet potato --  cup mashed (124 g) has 4 g of fiber.  Winter squash -- 1 cup cooked (205 g) has 5.7 g of fiber. Grains  Bran cereal --  cup (31 g) has 8.6 g of fiber.  Bulgur (cooked) --  cup (70 g) has 4 g of fiber.  Quinoa (cooked) -- 1 cup (185 g) has 5.2 g of fiber.  Popcorn -- 3 cups (375 g) popped has 5.8 g of fiber.  Spaghetti, whole wheat -- 1 cup (140 g) has 6 g of fiber. Meats and other proteins  Pinto beans (cooked) --  cup (90 g) has 7.7 g of fiber.  Lentils (cooked) --  cup (90 g) has 7.8 g of fiber.  Kidney beans (canned) --  cup (92.5 g) has 5.7 g of fiber.  Soybeans (canned, frozen, or fresh) --  cup (92.5 g) has 5.2 g of fiber.  Baked beans, plain or vegetarian (canned) --  cup (130 g) has 5.2 g of fiber.  Garbanzo beans or  chickpeas (canned) --  cup (90 g) has 6.6 g of fiber.  Black beans (cooked) --  cup (86 g) has 7.5 g of fiber.  White beans or navy beans (cooked) --  cup (91 g) has 9.3 g of fiber. The items listed above may not be a complete list of foods with high fiber. Actual amounts of fiber may be different depending on processing. Contact a dietitian for more information.   What foods are moderate in fiber? Fruits  Banana -- 1 medium (126 g) has 3.2 g of fiber.  Melon -- 1 cup (155 g) has 1.4 g of fiber.  Orange -- 1 small (154 g) has 3.7 g of fiber.  Raisins --  cup (40 g) has 1.8 g of fiber.  Applesauce, sweetened --  cup (125 g) has 1.5 g of fiber.  Blueberries (fresh) --  cup (75 g) has 1.8 g of fiber.  Strawberries (fresh, sliced) -- 1 cup (989 g) has 3 g of fiber.  Cherries -- 1 cup (140 g) has 2.9 g of fiber. Vegetables  Broccoli  (cooked) --  cup (77.5 g) has 2.1 g of fiber.  Carrots (cooked) --  cup (77.5 g) has 2.2 g of fiber.  Corn (canned or frozen) --  cup (82.5 g) has 2.1 g of fiber.  Potatoes, mashed --  cup (105 g) has 1.6 g of fiber.  Tomato -- 1 medium (62 g) has 1.5 g of fiber.  Green beans (canned) --  cup (83 g) has 2 g of fiber.  Squash, winter --  cup (58 g) has 1 g of fiber.  Sweet potato, baked -- 1 medium (150 g) has 3 g of fiber.  Cauliflower (cooked) -- 1/2 cup (90 g) has 2.3 g of fiber. Grains  Long-grain brown rice (cooked) -- 1 cup (196 g) has 3.5 g of fiber.  Bagel, plain -- one 4-inch (10 cm) bagel has 2 g of fiber.  Instant oatmeal --  cup (120 g) has about 2 g of fiber.  Macaroni noodles, enriched (cooked) -- 1 cup (140 g) has 2.5 g of fiber.  Multigrain cereal --  cup (15 g) has about 2-4 g of fiber.  Whole-wheat bread -- 1 slice (26 g) has 2 g of fiber.  Whole-wheat spaghetti noodles --  cup (70 g) has 3.2 g of fiber.  Corn tortilla -- one 6-inch (15 cm) tortilla has 1.5 g of fiber. Meats and other proteins  Almonds --  cup or 1 oz (28 g) has 3.5 g of fiber.  Sunflower seeds in shell --  cup or  oz (11.5 g) has 1.1 g of fiber.  Vegetable or soy patty -- 1 patty (70 g) has 3.4 g of fiber.  Walnuts --  cup or 1 oz (30 g) has 2 g of fiber.  Flax seed -- 1 Tbsp (7 g) has 2.8 g of fiber. The items listed above may not be a complete list of foods that have moderate amounts of fiber. Actual amounts of fiber may be different depending on processing. Contact a dietitian for more information.   What foods are low in fiber? Low-fiber foods contain less than 1 g of fiber per serving. They include: Fruits  Fruit juice --  cup or 4 fl oz (118 mL) has 0.5 g of fiber. Vegetables  Lettuce -- 1 cup (35 g) has 0.5 g of fiber.  Cucumber (slices) --  cup (60 g) has 0.3 g of fiber.  Celery --  1 stalk (40 g) has 0.1 g of fiber. Grains  Flour tortilla -- one  6-inch (15 cm) tortilla has 0.5 g of fiber.  White rice (cooked) --  cup (81.5 g) has 0.3 g of fiber. Meats and other proteins  Egg -- 1 large (50 g) has 0 g of fiber.  Meat, poultry, or fish -- 3 oz (85 g) has 0 g of fiber. Dairy  Milk -- 1 cup or 8 fl oz (237 mL) has 0 g of fiber.  Yogurt -- 1 cup (245 g) has 0 g of fiber. The items listed above may not be a complete list of foods that are low in fiber. Actual amounts of fiber may be different depending on processing. Contact a dietitian for more information.   Summary  Fiber is a substance that is found in plant foods, such as fruits, vegetables, whole grains, nuts, seeds, and beans.  As part of your treatment and recovery plan, your health care provider may recommend that you eat foods that have specific amounts of dietary fiber. This information is not intended to replace advice given to you by your health care provider. Make sure you discuss any questions you have with your health care provider. Document Revised: 02/14/2020 Document Reviewed: 02/14/2020 Elsevier Patient Education  [place].  Please purchase the following medications over the counter and take as directed: Fiber Supplement   Restart Probiotic  Please go to the lab on the 2nd floor [address] before you leave the office today.   Follow up in 3-6 months   It was a pleasure to see you today!  Vito Cirigliano, D.O.

## 2020-12-02 LAB — BASIC METABOLIC PANEL
BUN/Creatinine Ratio: 13 (ref 10–24)
BUN: 12 mg/dL (ref 8–27)
CO2: 20 mmol/L (ref 20–29)
Calcium: 9.8 mg/dL (ref 8.6–10.2)
Chloride: 99 mmol/L (ref 96–106)
Creatinine, Ser: 0.95 mg/dL (ref 0.76–1.27)
GFR calc Af Amer: 95 mL/min/{1.73_m2} (ref >59)
GFR calc non Af Amer: 82 mL/min/{1.73_m2} (ref >59)
Glucose: 127 mg/dL — ABNORMAL HIGH (ref 65–99)
Potassium: 5.2 mmol/L (ref 3.5–5.2)
Sodium: 139 mmol/L (ref 134–144)

## 2020-12-03 DIAGNOSIS — E1165 Type 2 diabetes mellitus with hyperglycemia: Secondary | ICD-10-CM | POA: Diagnosis not present

## 2020-12-03 DIAGNOSIS — I10 Essential (primary) hypertension: Secondary | ICD-10-CM | POA: Diagnosis not present

## 2020-12-04 DIAGNOSIS — I10 Essential (primary) hypertension: Secondary | ICD-10-CM | POA: Diagnosis not present

## 2020-12-04 DIAGNOSIS — E0800 Diabetes mellitus due to underlying condition with hyperosmolarity without nonketotic hyperglycemic-hyperosmolar coma (NKHHC): Secondary | ICD-10-CM | POA: Diagnosis not present

## 2020-12-06 ENCOUNTER — Encounter: Payer: Self-pay | Admitting: Family Medicine

## 2020-12-12 ENCOUNTER — Other Ambulatory Visit: Payer: Self-pay | Admitting: Family Medicine

## 2020-12-12 DIAGNOSIS — G8929 Other chronic pain: Secondary | ICD-10-CM

## 2020-12-12 DIAGNOSIS — M25512 Pain in left shoulder: Secondary | ICD-10-CM

## 2020-12-15 ENCOUNTER — Other Ambulatory Visit: Payer: Self-pay | Admitting: Family Medicine

## 2020-12-15 DIAGNOSIS — I10 Essential (primary) hypertension: Secondary | ICD-10-CM

## 2020-12-16 MED ORDER — CLONIDINE HCL 0.2 MG PO TABS: 0.2000 mg | ORAL_TABLET | Freq: Two times a day (BID) | ORAL | 3 refills | Status: DC

## 2020-12-22 ENCOUNTER — Ambulatory Visit: Payer: Medicare HMO | Admitting: Family Medicine

## 2020-12-28 NOTE — Progress Notes (Addendum)
[name] at [facility] [address] [place], [place] [address] [phone]  Date:  12/31/2020   Name:  David Singh   DOB:  09-Apr-1951   MRN:  [id number]  PCP:  Darreld Mclean, MD    Chief Complaint: Medical Management of Chronic Issues (Med check /) and Back Pain (Upper back in the muscles. Going on for the last couple of weeks. Uses icyhot and it has helped )   History of Present Illness:  David Singh is a 70 y.o. very pleasant male patient who presents with the following:  Patient here today for 50-month follow-up visit- History of diabetes, CAD with stent, hyperlipidemia, hypertension, significant peripheral arterial disease Last seen by myself for virtual visit in January, at which time he was having difficulty with diarrhea  He was seen by gastroenterology, Dr. Bryan Lemma He did come down with COVID in February but fortunately did well He notes that he got through this ok but the cough lasted a long time   Seen by his endocrinologist, Dr. Posey Pronto in [place] 6.9% ASSESSMENT/PLAN: 1. Type 2 diabetes mellitus with diabetic peripheral angiopathy without gangrene, without long-term current use of insulin (David Singh) I had a detailed discussion about further management of diabetes with the patient. I have emphasized to the patient about importance of continuing with dietary instructions and lifestyle modification. Refilled metformin and glimepiride. If the patient's diabetes control gets worse in the future then consider adding SGLT2 inhibitors or GLP-1 analogs.  Repeat labs - POCT Hemoglobin A1C - Comprehensive Metabolic Panel; Future - CBC; Future - Lipid Profile; Future - Albumin, Urine; Future - TSH, 3rd Generation; Future - Free Thyroxine (Free T4); Future 2. Essential hypertension Monitor urine microalbumin.  - Albumin, Urine; Future 3. Mixed hyperlipidemia Monitor fasting lipid panel and adjust the  dose of cholesterol regimen if necessary. I will order amylase and lipase as ordered by the primary doctor. - Lipid Profile; Future - Amylase; Future - Lipase; Future 4. Morbid obesity ([name]) I have emphasized to the patient about importance of continuing with dietary instructions and lifestyle modification.   Amlodipine Aspirin Buspirone 15 VIT Carvedilol Clonidine Lisinopril Metformin Protonix Crestor Spironolactone  Lipid profile ordered per [facility] in January, ratio 4.1 A1c in January 6.9% CMP normal CBC showed minimal anemia-hemoglobin 13.1-this is baseline for patient  He has his stool samples to test for Dr C -will turn into our lab today His leg pain is about the same due to claudication, but he has had some better days recently and was able to walk further than he normally can  He has noted some pain in the thoracic musculature for about one week Tends to bother him when he twists a certain way, especially in the morning.  He denies any chest pain or shortness of breath This may be due to cough which she recently had due to Covid Patient Active Problem List   Diagnosis Date Noted  . Primary osteoarthritis of right knee 03/03/2020  . Chronic gout 03/09/2016  . Hypertriglyceridemia 01/16/2015  . Type 2 diabetes mellitus ([place]) 01/06/2015  . CAD in native artery 01/06/2015  . History of coronary artery stent placement 01/06/2015  . PAD (peripheral artery disease) ([place]) 01/06/2015  . Hyperlipidemia 01/06/2015  . Essential hypertension 01/06/2015    Past Medical History:  Diagnosis Date  . CAD (coronary artery disease)   . Cancer ([place])    basal cell excision upper right ear 7  yrs ago  . Complication of anesthesia    bad pain with anesthesia induction with colonscopy 6 yrs ago  . Diabetes mellitus without complication ([place])    type 2  . GERD (gastroesophageal reflux disease)   . Hyperlipidemia   . Hypertension   . Peripheral vascular disease ([place])     partial clogged artery right leg  . Sleep apnea    yrs ago sleep study no cpap used    Past Surgical History:  Procedure Laterality Date  . ABDOMINAL AORTOGRAM W/LOWER EXTREMITY N/A 12/27/2016   Procedure: Abdominal Aortogram w/Lower Extremity;  Surgeon: Angelia Mould, MD;  Location: [place] [facility];  Service: Cardiovascular;  Laterality: N/A;  . CARDIAC SURGERY    . CORONARY ANGIOPLASTY WITH STENT PLACEMENT  11/2004   in [place]  . MENISCUS REPAIR Right   . NASAL SINUS SURGERY  1976  . TONSILLECTOMY      Social History   Tobacco Use  . Smoking status: Former Smoker    Packs/day: 1.50    Years: 26.00    Pack years: 39.00    Types: Cigarettes    Quit date: 1994    Years since quitting: 28.2  . Smokeless tobacco: Former Network engineer  . Vaping Use: Never used  Substance Use Topics  . Alcohol use: Yes    Alcohol/week: 0.0 standard drinks    Comment: 3 ounces on monday  . Drug use: No    Family History  Problem Relation Age of Onset  . Alcohol abuse Brother   . Cancer Brother   . Diabetes Brother   . Hyperlipidemia Brother   . Hypertension Brother   . Diabetes Mother   . Hypertension Mother   . Hyperlipidemia Mother   . Heart attack Mother   . Hyperlipidemia Father   . Hypertension Father     Allergies  Allergen Reactions  . Dexilant [Dexlansoprazole]     Pressure in stomach and chest    Medication list has been reviewed and updated.  Current Outpatient Medications on File Prior to Visit  Medication Sig Dispense Refill  . amLODipine (NORVASC) 10 MG tablet TAKE 1 TABLET EVERY DAY 90 tablet 3  . aspirin 81 MG tablet Take 81 mg by mouth daily.    . Blood Glucose Monitoring Suppl (FREESTYLE LITE) DEVI CHECK BLOOD SUGAR ONCE DAILY  0  . Blood Glucose Monitoring Suppl (FREESTYLE LITE) DEVI CHECK BLOOD SUGAR ONCE DAILY 1 each 0  . busPIRone (BUSPAR) 15 MG tablet Take 1 tablet (15 mg total) by mouth 2 (two) times daily. 180 tablet 0  . carvedilol  (COREG) 25 MG tablet TAKE 1 TABLET BY MOUTH TWICE A DAY 180 tablet 3  . Cholecalciferol (VITAMIN D3) 2000 units TABS Take 2,000 Units by mouth daily.    . cloNIDine (CATAPRES) 0.2 MG tablet Take 1 tablet (0.2 mg total) by mouth 2 (two) times daily. 180 tablet 3  . Coenzyme Q10 300 MG CAPS Take 300 mg by mouth daily.     . Cyanocobalamin (VITAMIN B-12 PO) Take 2,500 mg by mouth daily.    Marland Kitchen glucose blood (FREESTYLE LITE) test strip Check blood sugar 1-3x daily 100 each 12  . Lancets (FREESTYLE) lancets Check blood sugar once daily 100 each 12  . lisinopril (ZESTRIL) 40 MG tablet TAKE 1.5 TABLETS (60 MG TOTAL) BY MOUTH DAILY. 135 tablet 0  . meloxicam (MOBIC) 15 MG tablet Take 1 tablet (15 mg total) by mouth daily as needed for pain.  90 tablet 1  . metFORMIN (GLUCOPHAGE) 1000 MG tablet TAKE 1 TABLET (1,000 MG TOTAL) BY MOUTH 2 (TWO) TIMES DAILY WITH A MEAL. 180 tablet 3  . MULTIPLE VITAMIN PO Take 1 tablet by mouth daily.     . niacin 500 MG tablet Take 500 mg by mouth every morning.     . Omega-3 Fatty Acids (EQL OMEGA 3 FISH OIL) 1000 MG CAPS Take 1 capsule by mouth 2 (two) times daily.    . pantoprazole (PROTONIX) 40 MG tablet Take 1 tablet (40 mg total) by mouth daily. 90 tablet 3  . prednisoLONE acetate (PRED FORTE) 1 % ophthalmic suspension     . rosuvastatin (CRESTOR) 40 MG tablet Take 1 tablet (40 mg total) by mouth daily. 90 tablet 3  . spironolactone (ALDACTONE) 25 MG tablet Take 1 tablet (25 mg total) by mouth daily. 90 tablet 2   No current facility-administered medications on file prior to visit.    Review of Systems:  As per HPI- otherwise negative.   Physical Examination: Vitals:   12/31/20 0843 12/31/20 0903  BP: (!) 170/100 (!) 142/90  Pulse: 82   SpO2: 98%    Vitals:   12/31/20 0843  Weight: 283 lb (128.4 kg)  Height: 5\' 9"  (1.753 m)   Body mass index is 41.79 kg/m. Ideal Body Weight: Weight in (lb) to have BMI = 25: 168.9  GEN: no acute distress. HEENT:  Atraumatic, Normocephalic.  Ears and Nose: No external deformity. CV: RRR, No M/G/R. No JVD. No thrill. No extra heart sounds. PULM: CTA B, no wheezes, crackles, rhonchi. No retractions. No resp. distress. No accessory muscle use. ABD: S, NT, ND, +BS. No rebound. No HSM. EXTR: No c/c/e PSYCH: Normally interactive. Conversant.  I am not able to reproduce the tenderness in his back by pressing on the musculature  Foot exam shows normal sensation, cannot palpate a pulse bilaterally Assessment and Plan: Type 2 diabetes mellitus without complication, without long-term current use of insulin (HCC)  Dyslipidemia  Essential hypertension  PAD (peripheral artery disease) (HCC)  Screening for prostate cancer - Plan: PSA  Spasm of thoracic back muscle - Plan: methocarbamol (ROBAXIN) 500 MG tablet  Patient today for follow-up visit.  His diabetes is being managed by endocrinology, under good control Peripheral arterial disease is stable, symptoms are steady Blood pressure is elevated today in clinic.  Patient notes he is having car trouble and thought he was in to be late for his visit, this is probably why his blood pressure is high.  At home his blood pressures have been well controlled per his report Last PSA had gone up, will check level today Thoracic muscle soreness, I suspect due to cough from recent COVID-19 illness.  Offered x-ray today, patient declines.  Prescribed Robaxin to use as needed, caution regarding sedation Will plan further follow- up pending labs. Asked for visit in about 6 months assuming all is well This visit occurred during the SARS-CoV-2 public health emergency.  Safety protocols were in place, including screening questions prior to the visit, additional usage of staff PPE, and extensive cleaning of exam room while observing appropriate contact time as indicated for disinfecting solutions.    Signed Lamar Blinks, MD  Received PSA as below, message to  patient  Results for orders placed or performed in visit on 12/31/20  PSA  Result Value Ref Range   PSA 2.47 0.10 - 4.00 ng/mL   Lab Results  Component Value Date   PSA 2.47  12/31/2020   PSA 2.20 01/30/2020   PSA 0.68 10/06/2017    PSA unfortunate continues to trend up.  I will refer for urology evaluation

## 2020-12-30 ENCOUNTER — Other Ambulatory Visit: Payer: Self-pay | Admitting: *Deleted

## 2020-12-30 MED ORDER — CLONIDINE HCL 0.2 MG PO TABS: 0.2000 mg | ORAL_TABLET | Freq: Two times a day (BID) | ORAL | 3 refills | Status: DC

## 2020-12-30 NOTE — Patient Instructions (Addendum)
It was good to see you again today!   We will check your PSA today and I will be in touch with your reports Your BP was a bit elevated- however, it sounds like you have been ok at home.  Please keep an eye on your BP- if your BP is consistently over 140/85 please contact me  Continue to work on exercise to keep your legs going Please see me in 6 months assuming all is well

## 2020-12-31 ENCOUNTER — Encounter: Payer: Self-pay | Admitting: Family Medicine

## 2020-12-31 ENCOUNTER — Telehealth: Payer: Self-pay | Admitting: *Deleted

## 2020-12-31 ENCOUNTER — Other Ambulatory Visit: Payer: Self-pay

## 2020-12-31 ENCOUNTER — Ambulatory Visit (INDEPENDENT_AMBULATORY_CARE_PROVIDER_SITE_OTHER): Payer: Medicare HMO | Admitting: Family Medicine

## 2020-12-31 ENCOUNTER — Other Ambulatory Visit: Payer: Medicare HMO

## 2020-12-31 VITALS — BP 142/90 | HR 82 | Ht 69.0 in | Wt 283.0 lb

## 2020-12-31 DIAGNOSIS — R197 Diarrhea, unspecified: Secondary | ICD-10-CM | POA: Diagnosis not present

## 2020-12-31 DIAGNOSIS — I10 Essential (primary) hypertension: Secondary | ICD-10-CM

## 2020-12-31 DIAGNOSIS — R972 Elevated prostate specific antigen [PSA]: Secondary | ICD-10-CM

## 2020-12-31 DIAGNOSIS — E785 Hyperlipidemia, unspecified: Secondary | ICD-10-CM | POA: Diagnosis not present

## 2020-12-31 DIAGNOSIS — E119 Type 2 diabetes mellitus without complications: Secondary | ICD-10-CM | POA: Diagnosis not present

## 2020-12-31 DIAGNOSIS — Z125 Encounter for screening for malignant neoplasm of prostate: Secondary | ICD-10-CM

## 2020-12-31 DIAGNOSIS — I739 Peripheral vascular disease, unspecified: Secondary | ICD-10-CM

## 2020-12-31 DIAGNOSIS — M62830 Muscle spasm of back: Secondary | ICD-10-CM | POA: Diagnosis not present

## 2020-12-31 LAB — PSA: PSA: 2.47 ng/mL (ref 0.10–4.00)

## 2020-12-31 MED ORDER — METHOCARBAMOL 500 MG PO TABS: 500.0000 mg | ORAL_TABLET | Freq: Three times a day (TID) | ORAL | 0 refills | Status: DC | PRN

## 2020-12-31 NOTE — Addendum Note (Signed)
Addended by: Lamar Blinks C on: 12/31/2020 07:24 PM   Modules accepted: Orders

## 2020-12-31 NOTE — Addendum Note (Signed)
Addended by: Lamar Blinks C on: 12/31/2020 07:27 PM   Modules accepted: Orders

## 2020-12-31 NOTE — Progress Notes (Signed)
Specimen handling fee was charged in [place] with PCP from today.

## 2020-12-31 NOTE — Telephone Encounter (Signed)
Pt returned stool samples today.  Upon processing, I see that pt was given incorrect bottle for GI profile test. It requires orange para pak bottle. Pt has been notified and apologies given for the inconvenience to him. Pt voices understanding and is agreeable to pick up correct bottle to complete GI profile. He will pick kit up on Monday. Notified Tracy in GI.

## 2020-12-31 NOTE — Addendum Note (Signed)
Addended by: Kelle Darting A on: 12/31/2020 01:52 PM   Modules accepted: Orders

## 2021-01-03 LAB — CALPROTECTIN, FECAL: Calprotectin, Fecal: 97 ug/g (ref 0–120)

## 2021-01-07 LAB — PANCREATIC ELASTASE, FECAL: Pancreatic Elastase-1, Stool: >500 mcg/g

## 2021-01-21 DIAGNOSIS — E119 Type 2 diabetes mellitus without complications: Secondary | ICD-10-CM | POA: Diagnosis not present

## 2021-01-21 DIAGNOSIS — I10 Essential (primary) hypertension: Secondary | ICD-10-CM | POA: Diagnosis not present

## 2021-01-27 ENCOUNTER — Ambulatory Visit: Payer: Medicare HMO | Admitting: Internal Medicine

## 2021-02-16 ENCOUNTER — Other Ambulatory Visit (INDEPENDENT_AMBULATORY_CARE_PROVIDER_SITE_OTHER): Payer: Medicare HMO

## 2021-02-16 ENCOUNTER — Other Ambulatory Visit: Payer: Self-pay

## 2021-02-16 ENCOUNTER — Ambulatory Visit: Payer: Medicare HMO

## 2021-02-16 DIAGNOSIS — R197 Diarrhea, unspecified: Secondary | ICD-10-CM | POA: Diagnosis not present

## 2021-02-16 DIAGNOSIS — A09 Infectious gastroenteritis and colitis, unspecified: Secondary | ICD-10-CM | POA: Diagnosis not present

## 2021-02-17 ENCOUNTER — Encounter: Payer: Self-pay | Admitting: Family Medicine

## 2021-02-17 DIAGNOSIS — E119 Type 2 diabetes mellitus without complications: Secondary | ICD-10-CM

## 2021-02-18 MED ORDER — METFORMIN HCL 1000 MG PO TABS: 1000.0000 mg | ORAL_TABLET | Freq: Two times a day (BID) | ORAL | 3 refills | Status: DC

## 2021-02-18 NOTE — Addendum Note (Signed)
Addended by: Lamar Blinks C on: 02/18/2021 05:58 AM   Modules accepted: Orders

## 2021-02-19 DIAGNOSIS — I10 Essential (primary) hypertension: Secondary | ICD-10-CM | POA: Diagnosis not present

## 2021-02-19 DIAGNOSIS — E0800 Diabetes mellitus due to underlying condition with hyperosmolarity without nonketotic hyperglycemic-hyperosmolar coma (NKHHC): Secondary | ICD-10-CM | POA: Diagnosis not present

## 2021-02-19 LAB — GI PROFILE, STOOL, PCR

## 2021-02-20 ENCOUNTER — Telehealth: Payer: Self-pay | Admitting: General Surgery

## 2021-02-20 MED ORDER — CIPROFLOXACIN HCL 500 MG PO TABS: 500.0000 mg | ORAL_TABLET | Freq: Two times a day (BID) | ORAL | 0 refills | Status: DC

## 2021-02-20 NOTE — Telephone Encounter (Signed)
Spoke with the patient and he stated that he saw the results through mychart and did a bit of research. He is willing to take Cipro as suggested and start on a probiotic. Patients pharmacy verified. rx sent

## 2021-02-20 NOTE — Telephone Encounter (Signed)
-----   Message from [place], DO sent at 02/20/2021  8:12 AM EDT ----- GI PCR positive for Yersinia enterocolitica.  Remainder of the infectious panel was negative.  Typically, Yersinia does not require antibiotic therapy.  However, given the prolonged duration of symptoms, not unreasonable to treat with ciprofloxacin 500 mg p.o. twice daily x5 days. #10, RF0.  Also start probiotic and continue for at least 3 weeks beyond completion of antibiotic course.

## 2021-02-21 DIAGNOSIS — E119 Type 2 diabetes mellitus without complications: Secondary | ICD-10-CM | POA: Diagnosis not present

## 2021-02-21 DIAGNOSIS — I10 Essential (primary) hypertension: Secondary | ICD-10-CM | POA: Diagnosis not present

## 2021-03-07 ENCOUNTER — Other Ambulatory Visit: Payer: Self-pay | Admitting: Family Medicine

## 2021-03-07 DIAGNOSIS — I10 Essential (primary) hypertension: Secondary | ICD-10-CM

## 2021-03-09 NOTE — Progress Notes (Signed)
Name: Tag Wurtz [facility]  MRN/ DOB: [id number], 1950/11/09   Age/ Sex: 70 y.o., male    PCP: Copland, Gay Filler, MD   Reason for Endocrinology Evaluation: Type 2 Diabetes Mellitus     Date of Initial Endocrinology Visit: 03/10/2021     PATIENT IDENTIFIER: Mr. Jarrah Babich is a 70 y.o. male with a past medical history of T2DM, OSA, HTN, CAD  and Dyslipidemia. The patient presented for initial endocrinology clinic visit on 03/10/2021 for consultative assistance with his diabetes management.    HPI: Mr. Dapolito is transferring care from Dr. Posey Pronto at [facility]  Diagnosed with DM many years  Prior Medications tried/Intolerance: does not recall  Currently checking blood sugars 1 -2x / day  Hypoglycemia episodes : yes     Symptoms: yes           Frequency: last episode was 2 days ago 68 mg/dL  Hemoglobin A1c has ranged from 6.8% in 2020, peaking at 7.3% in 2018. Patient required assistance for hypoglycemia: no  Patient has required hospitalization within the last 1 year from hyper or hypoglycemia: no  In terms of diet, the patient eats 2 meals a day, snacks x1 a day. Avoids sugar- sweetened beverages    He participates in [place] prgram . Dr. Gus Rankin . They contact him once a month for check ups    HOME DIABETES REGIMEN: Metformin 1000 mg BID  Glipizide XL 2.5 mg daily takes it after eating.   Statin: yes ACE-I/ARB: yes Prior Diabetic Education:yes    METER DOWNLOAD SUMMARY: Did not bring    DIABETIC COMPLICATIONS: Microvascular complications:    Denies: CKD, neuropathy, retinopathy  Last eye exam: Completed 2021  Macrovascular complications:   S/P PCI   Denies:PVD, CVA   PAST HISTORY: Past Medical History:  Past Medical History:  Diagnosis Date  . CAD (coronary artery disease)   . Cancer ([place])    basal cell excision upper right ear 7 yrs ago  . Complication of anesthesia    bad pain with anesthesia induction with colonscopy 6 yrs ago  .  Diabetes mellitus without complication ([place])    type 2  . GERD (gastroesophageal reflux disease)   . Hyperlipidemia   . Hypertension   . Peripheral vascular disease ([place])    partial clogged artery right leg  . Sleep apnea    yrs ago sleep study no cpap used   Past Surgical History:  Past Surgical History:  Procedure Laterality Date  . ABDOMINAL AORTOGRAM W/LOWER EXTREMITY N/A 12/27/2016   Procedure: Abdominal Aortogram w/Lower Extremity;  Surgeon: Angelia Mould, MD;  Location: [place] [facility];  Service: Cardiovascular;  Laterality: N/A;  . CARDIAC SURGERY    . CORONARY ANGIOPLASTY WITH STENT PLACEMENT  11/2004   in [place]  . MENISCUS REPAIR Right   . NASAL SINUS SURGERY  1976  . TONSILLECTOMY        Social History:  reports that he quit smoking about 28 years ago. His smoking use included cigarettes. He has a 39.00 pack-year smoking history. He has quit using smokeless tobacco. He reports current alcohol use. He reports that he does not use drugs. Family History:  Family History  Problem Relation Age of Onset  . Alcohol abuse Brother   . Cancer Brother   . Diabetes Brother   . Hyperlipidemia Brother   . Hypertension Brother   . Diabetes Mother   . Hypertension Mother   . Hyperlipidemia Mother   . Heart attack Mother   .  Hyperlipidemia Father   . Hypertension Father      HOME MEDICATIONS: Allergies as of 03/10/2021      Reactions   Dexilant [dexlansoprazole]    Pressure in stomach and chest      Medication List       Accurate as of Mar 10, 2021  8:07 AM. If you have any questions, ask your nurse or doctor.        STOP taking these medications   ciprofloxacin 500 MG tablet Commonly known as: CIPRO Stopped by: Dorita Sciara, MD     TAKE these medications   amLODipine 10 MG tablet Commonly known as: NORVASC TAKE 1 TABLET EVERY DAY   aspirin 81 MG tablet Take 81 mg by mouth daily.   busPIRone 15 MG tablet Commonly known as:  BUSPAR Take 1 tablet (15 mg total) by mouth 2 (two) times daily.   carvedilol 25 MG tablet Commonly known as: COREG TAKE 1 TABLET BY MOUTH TWICE A DAY   cloNIDine 0.2 MG tablet Commonly known as: Catapres Take 1 tablet (0.2 mg total) by mouth 2 (two) times daily.   Coenzyme Q10 300 MG Caps Take 300 mg by mouth daily.   EQL Omega 3 Fish Oil 1000 MG Caps Take 1 capsule by mouth 2 (two) times daily.   freestyle lancets Check blood sugar once daily   FreeStyle Lite Devi CHECK BLOOD SUGAR ONCE DAILY   FreeStyle Lite Devi CHECK BLOOD SUGAR ONCE DAILY   glucose blood test strip Commonly known as: FREESTYLE LITE Check blood sugar 1-3x daily   lisinopril 40 MG tablet Commonly known as: ZESTRIL Take 1.5 tablets (60 mg total) by mouth daily.   meloxicam 15 MG tablet Commonly known as: MOBIC Take 1 tablet (15 mg total) by mouth daily as needed for pain.   metFORMIN 1000 MG tablet Commonly known as: GLUCOPHAGE Take 1 tablet (1,000 mg total) by mouth 2 (two) times daily with a meal.   methocarbamol 500 MG tablet Commonly known as: Robaxin Take 1 tablet (500 mg total) by mouth every 8 (eight) hours as needed for muscle spasms. Use for back pain   MULTIPLE VITAMIN PO Take 1 tablet by mouth daily.   niacin 500 MG tablet Take 500 mg by mouth every morning.   pantoprazole 40 MG tablet Commonly known as: PROTONIX Take 1 tablet (40 mg total) by mouth daily.   prednisoLONE acetate 1 % ophthalmic suspension Commonly known as: PRED FORTE   rosuvastatin 40 MG tablet Commonly known as: Crestor Take 1 tablet (40 mg total) by mouth daily.   spironolactone 25 MG tablet Commonly known as: ALDACTONE Take 1 tablet (25 mg total) by mouth daily.   VITAMIN B-12 PO Take 2,500 mg by mouth daily.   Vitamin D3 50 MCG (2000 UT) Tabs Take 2,000 Units by mouth daily.        ALLERGIES: Allergies  Allergen Reactions  . Dexilant [Dexlansoprazole]     Pressure in stomach and chest      REVIEW OF SYSTEMS: A comprehensive ROS was conducted with the patient and is negative except as per HPI and below:  ROS    OBJECTIVE:   VITAL SIGNS: BP 130/82   Pulse 79   Ht 5\' 9"  (1.753 m)   Wt 285 lb (129.3 kg)   SpO2 98%   BMI 42.09 kg/m    PHYSICAL EXAM:  General: Pt appears well and is in NAD  Neck: General: Supple without adenopathy or carotid bruits. Thyroid:  Thyroid size normal.  No goiter or nodules appreciated.   Lungs: Clear with good BS bilat with no rales, rhonchi, or wheezes  Heart: RRR with normal S1 and S2 and no gallops; no murmurs; no rub  Abdomen: Normoactive bowel sounds, soft, nontender, without masses or organomegaly palpable  Extremities:  Lower extremities - No pretibial edema. No lesions.  Skin: Normal texture and temperature to palpation. No rash noted.   Neuro: MS is good with appropriate affect, pt is alert and Ox3    DM foot exam: 03/10/2021  The skin of the feet is intact without sores or ulcerations. The pedal pulses are 2+ on right and 2+ on left. The sensation is intact to a screening 5.07, 10 gram monofilament bilaterally   DATA REVIEWED:  Lab Results  Component Value Date   HGBA1C 6.5 (A) 03/10/2021   HGBA1C 6.9 (H) 06/18/2020   HGBA1C 7.1 (H) 01/30/2020   Lab Results  Component Value Date   MICROALBUR 1.0 05/04/2017   LDLCALC 67 06/18/2020   CREATININE 0.95 12/01/2020   Lab Results  Component Value Date   MICRALBCREAT 2.2 05/04/2017    Lab Results  Component Value Date   CHOL 123 06/18/2020   HDL 31 (L) 06/18/2020   LDLCALC 67 06/18/2020   LDLDIRECT 90.0 01/30/2020   TRIG 186 (H) 06/18/2020   CHOLHDL 4.0 06/18/2020       Results for Leason, Sixto MICHAEL "[name]" (MRN [id number]) as of 03/10/2021 08:05  Ref. Range 12/01/2020 00:00  Sodium Latest Ref Range: 134 - 144 mmol/L 139  Potassium Latest Ref Range: 3.5 - 5.2 mmol/L 5.2  Chloride Latest Ref Range: 96 - 106 mmol/L 99  CO2 Latest Ref Range: 20 - 29 mmol/L  20  Glucose Latest Ref Range: 65 - 99 mg/dL 127 (H)  BUN Latest Ref Range: 8 - 27 mg/dL 12  Creatinine Latest Ref Range: 0.76 - 1.27 mg/dL 0.95  Calcium Latest Ref Range: 8.6 - 10.2 mg/dL 9.8  BUN/Creatinine Ratio Latest Ref Range: 10 - 24  13   ASSESSMENT / PLAN / RECOMMENDATIONS:   1) Type 2 Diabetes Mellitus, Optimally controlled, With macrovascular  complications - Most recent A1c of 6.5 %. Goal A1c < 7.0 %.   Plan: GENERAL: I have discussed with the patient the pathophysiology of diabetes. We went over the natural progression of the disease. We talked about both insulin resistance and insulin deficiency. We stressed the importance of lifestyle changes including diet and exercise. I explained the complications associated with diabetes including retinopathy, nephropathy, neuropathy as well as increased risk of cardiovascular disease. We went over the benefit seen with glycemic control.    I explained to the patient that diabetic patients are at higher than normal risk for amputations.  Pt with hypoglycemia, discussed SU as a cause of hypoglycemia, he has been taking it after meals., I have asked him to take Glipizide 20-30 minutes before meals.   We discussed SGLT-2 inhibitors as an option which he is a great candidate give CAD history. He understands this may be costly. Discussed cardiovascular benefits as well as risk of genital infections   If he continues with hypoglycemia , will consider switching SU to SGLT-2 inhibitor   MEDICATIONS:  - Continue Metformin 1000 mg, 1 tablet  Twice daily  - Take Glipizide 2.5 mg , 1 tablet before Breakfast ( preferably 20-30 minutes before the first meal of the day )   EDUCATION / INSTRUCTIONS:  BG monitoring instructions: Patient is instructed to  check his blood sugars 1 times a day, fasting.  Call [facility] if: BG persistently < 70 . Marland Kitchen I reviewed the Rule of 15 for the treatment of hypoglycemia in detail with the  patient. Literature supplied.   2) Diabetic complications:   Eye: Does not have known diabetic retinopathy.   Neuro/ Feet: Does not have known diabetic peripheral neuropathy.  Renal: Patient does not have known baseline CKD. He is on an ACEI/ARB at present.  3) Dyslipidemia :  Patient is on rosuvastatin 40 mg daily . LDL at goal.  Managed by cardiology        Signed electronically by: Mack Guise, MD  [facility]  [address] [place], [place] [address] Phone: [phone] FAX: [phone]   CC: Darreld Mclean, [address] [place] [address] Phone: [phone]  Fax: [phone]    Return to Endocrinology clinic as below: No future appointments.

## 2021-03-10 ENCOUNTER — Encounter: Payer: Self-pay | Admitting: Internal Medicine

## 2021-03-10 ENCOUNTER — Other Ambulatory Visit: Payer: Self-pay

## 2021-03-10 ENCOUNTER — Ambulatory Visit: Payer: Medicare HMO | Admitting: Internal Medicine

## 2021-03-10 VITALS — BP 130/82 | HR 79 | Ht 69.0 in | Wt 285.0 lb

## 2021-03-10 DIAGNOSIS — E1159 Type 2 diabetes mellitus with other circulatory complications: Secondary | ICD-10-CM | POA: Diagnosis not present

## 2021-03-10 DIAGNOSIS — E119 Type 2 diabetes mellitus without complications: Secondary | ICD-10-CM | POA: Diagnosis not present

## 2021-03-10 DIAGNOSIS — E0800 Diabetes mellitus due to underlying condition with hyperosmolarity without nonketotic hyperglycemic-hyperosmolar coma (NKHHC): Secondary | ICD-10-CM | POA: Diagnosis not present

## 2021-03-10 DIAGNOSIS — I10 Essential (primary) hypertension: Secondary | ICD-10-CM | POA: Diagnosis not present

## 2021-03-10 LAB — POCT GLYCOSYLATED HEMOGLOBIN (HGB A1C): Hemoglobin A1C: 6.5 % — AB (ref 4.0–5.6)

## 2021-03-10 LAB — MICROALBUMIN / CREATININE URINE RATIO
Creatinine,U: 42.3 mg/dL
Microalb Creat Ratio: 11.3 mg/g (ref 0.0–30.0)
Microalb, Ur: 4.8 mg/dL — ABNORMAL HIGH (ref 0.0–1.9)

## 2021-03-10 LAB — POCT GLUCOSE (DEVICE FOR HOME USE): POC Glucose: 130 mg/dl — AB (ref 70–99)

## 2021-03-10 NOTE — Patient Instructions (Signed)
-   Continue Metformin 1000 mg, 1 tablet  Twice daily  - Take Glipizide 2.5 mg , 1 tablet before Breakfast ( preferably 20-30 minutes before the first meal of the day )      HOW TO TREAT LOW BLOOD SUGARS (Blood sugar LESS THAN 70 MG/DL)  Please follow the RULE OF 15 for the treatment of hypoglycemia treatment (when your (blood sugars are less than 70 mg/dL)    STEP 1: Take 15 grams of carbohydrates when your blood sugar is low, which includes:   3-4 GLUCOSE TABS  OR  3-4 OZ OF JUICE OR REGULAR SODA OR  ONE TUBE OF GLUCOSE GEL     STEP 2: RECHECK blood sugar in 15 MINUTES STEP 3: If your blood sugar is still low at the 15 minute recheck --> then, go back to STEP 1 and treat AGAIN with another 15 grams of carbohydrates.

## 2021-03-23 DIAGNOSIS — E119 Type 2 diabetes mellitus without complications: Secondary | ICD-10-CM | POA: Diagnosis not present

## 2021-03-23 DIAGNOSIS — I10 Essential (primary) hypertension: Secondary | ICD-10-CM | POA: Diagnosis not present

## 2021-03-26 DIAGNOSIS — I10 Essential (primary) hypertension: Secondary | ICD-10-CM | POA: Diagnosis not present

## 2021-03-26 DIAGNOSIS — E785 Hyperlipidemia, unspecified: Secondary | ICD-10-CM | POA: Diagnosis not present

## 2021-03-26 DIAGNOSIS — E1165 Type 2 diabetes mellitus with hyperglycemia: Secondary | ICD-10-CM | POA: Diagnosis not present

## 2021-03-26 DIAGNOSIS — K219 Gastro-esophageal reflux disease without esophagitis: Secondary | ICD-10-CM | POA: Diagnosis not present

## 2021-03-26 DIAGNOSIS — R69 Illness, unspecified: Secondary | ICD-10-CM | POA: Diagnosis not present

## 2021-03-26 DIAGNOSIS — Z008 Encounter for other general examination: Secondary | ICD-10-CM | POA: Diagnosis not present

## 2021-03-26 DIAGNOSIS — E1142 Type 2 diabetes mellitus with diabetic polyneuropathy: Secondary | ICD-10-CM | POA: Diagnosis not present

## 2021-03-26 DIAGNOSIS — Z6841 Body Mass Index (BMI) 40.0 and over, adult: Secondary | ICD-10-CM | POA: Diagnosis not present

## 2021-03-26 DIAGNOSIS — E1151 Type 2 diabetes mellitus with diabetic peripheral angiopathy without gangrene: Secondary | ICD-10-CM | POA: Diagnosis not present

## 2021-03-26 DIAGNOSIS — I2510 Atherosclerotic heart disease of native coronary artery without angina pectoris: Secondary | ICD-10-CM | POA: Diagnosis not present

## 2021-03-27 ENCOUNTER — Other Ambulatory Visit: Payer: Self-pay | Admitting: Family Medicine

## 2021-03-27 ENCOUNTER — Other Ambulatory Visit: Payer: Self-pay | Admitting: Cardiology

## 2021-03-27 DIAGNOSIS — F411 Generalized anxiety disorder: Secondary | ICD-10-CM

## 2021-04-06 MED ORDER — DIPHENOXYLATE-ATROPINE 2.5-0.025 MG PO TABS: 1.0000 | ORAL_TABLET | Freq: Four times a day (QID) | ORAL | 1 refills | Status: DC | PRN

## 2021-04-06 NOTE — Telephone Encounter (Signed)
Prescription called into [facility] pharmacy, spoke with pharmacist [facility].

## 2021-04-08 DIAGNOSIS — E119 Type 2 diabetes mellitus without complications: Secondary | ICD-10-CM | POA: Diagnosis not present

## 2021-04-08 DIAGNOSIS — I10 Essential (primary) hypertension: Secondary | ICD-10-CM | POA: Diagnosis not present

## 2021-04-09 ENCOUNTER — Other Ambulatory Visit: Payer: Self-pay

## 2021-04-09 MED ORDER — DIPHENOXYLATE-ATROPINE 2.5-0.025 MG PO TABS: 1.0000 | ORAL_TABLET | Freq: Four times a day (QID) | ORAL | 1 refills | Status: DC | PRN

## 2021-04-09 NOTE — Progress Notes (Signed)
Patient wanted script sent to [facility]

## 2021-04-16 ENCOUNTER — Other Ambulatory Visit: Payer: Self-pay | Admitting: Family Medicine

## 2021-04-16 DIAGNOSIS — F411 Generalized anxiety disorder: Secondary | ICD-10-CM

## 2021-04-23 DIAGNOSIS — I10 Essential (primary) hypertension: Secondary | ICD-10-CM | POA: Diagnosis not present

## 2021-04-23 DIAGNOSIS — E119 Type 2 diabetes mellitus without complications: Secondary | ICD-10-CM | POA: Diagnosis not present

## 2021-04-28 NOTE — Progress Notes (Deleted)
[place] [facility] [address] [place], [place] [address] [phone]  Date:  05/06/2021   Name:  David Singh   DOB:  01-04-1951   MRN:  [id number]  PCP:  Darreld Mclean, MD    Chief Complaint: No chief complaint on file.   History of Present Illness:  David Singh is a 70 y.o. very pleasant male patient who presents with the following:  Pt seen today with a concern about his rectum Last seen by myself in March-  Patient here today for 60-month follow-up visit- History of diabetes, CAD with stent, hyperlipidemia, hypertension, significant peripheral arterial disease  He had covid earlier this year- has declined to be vaccinated Shingles vaccine  Seen by endocrinology for his DM- [facility], May   Cologuard completed last year  Lab Results  Component Value Date   HGBA1C 6.5 (A) 03/10/2021     Patient Active Problem List   Diagnosis Date Noted   Diabetes mellitus ([place]) 03/10/2021   Primary osteoarthritis of right knee 03/03/2020   Chronic gout 03/09/2016   Hypertriglyceridemia 01/16/2015   Type 2 diabetes mellitus ([place]) 01/06/2015   CAD in native artery 01/06/2015   History of coronary artery stent placement 01/06/2015   PAD (peripheral artery disease) ([place]) 01/06/2015   Hyperlipidemia 01/06/2015   Essential hypertension 01/06/2015    Past Medical History:  Diagnosis Date   CAD (coronary artery disease)    Cancer ([place])    basal cell excision upper right ear 7 yrs ago   Complication of anesthesia    bad pain with anesthesia induction with colonscopy 6 yrs ago   Diabetes mellitus without complication ([place])    type 2   GERD (gastroesophageal reflux disease)    Hyperlipidemia    Hypertension    Peripheral vascular disease ([place])    partial clogged artery right leg   Sleep apnea    yrs ago sleep study no cpap used    Past Surgical History:  Procedure Laterality Date    ABDOMINAL AORTOGRAM W/LOWER EXTREMITY N/A 12/27/2016   Procedure: Abdominal Aortogram w/Lower Extremity;  Surgeon: Angelia Mould, MD;  Location: [facility];  Service: Cardiovascular;  Laterality: N/A;   CARDIAC SURGERY     CORONARY ANGIOPLASTY WITH STENT PLACEMENT  11/2004   in [place] Right    NASAL SINUS SURGERY  1976   TONSILLECTOMY      Social History   Tobacco Use   Smoking status: Former    Packs/day: 1.50    Years: 26.00    Pack years: 39.00    Types: Cigarettes    Quit date: 1994    Years since quitting: 28.5   Smokeless tobacco: Former  Scientific laboratory technician Use: Never used  Substance Use Topics   Alcohol use: Yes    Alcohol/week: 0.0 standard drinks    Comment: 3 ounces on monday   Drug use: No    Family History  Problem Relation Age of Onset   Alcohol abuse Brother    Cancer Brother    Diabetes Brother    Hyperlipidemia Brother    Hypertension Brother    Diabetes Mother    Hypertension Mother    Hyperlipidemia Mother    Heart attack Mother    Hyperlipidemia Father    Hypertension Father     Allergies  Allergen Reactions   Dexilant [Dexlansoprazole]     Pressure  in stomach and chest    Medication list has been reviewed and updated.  Current Outpatient Medications on File Prior to Visit  Medication Sig Dispense Refill   amLODipine (NORVASC) 10 MG tablet TAKE 1 TABLET EVERY DAY 90 tablet 3   aspirin 81 MG tablet Take 81 mg by mouth daily.     Blood Glucose Monitoring Suppl (FREESTYLE LITE) DEVI CHECK BLOOD SUGAR ONCE DAILY  0   Blood Glucose Monitoring Suppl (FREESTYLE LITE) DEVI CHECK BLOOD SUGAR ONCE DAILY 1 each 0   busPIRone (BUSPAR) 15 MG tablet TAKE ONE TABLET BY MOUTH TWICE A DAY 180 tablet 1   carvedilol (COREG) 25 MG tablet TAKE 1 TABLET BY MOUTH TWICE A DAY 180 tablet 3   Cholecalciferol (VITAMIN D3) 2000 units TABS Take 2,000 Units by mouth daily.     cloNIDine (CATAPRES) 0.2 MG tablet Take 1 tablet (0.2 mg  total) by mouth 2 (two) times daily. 180 tablet 3   Coenzyme Q10 300 MG CAPS Take 300 mg by mouth daily.      Cyanocobalamin (VITAMIN B-12 PO) Take 2,500 mg by mouth daily.     diphenoxylate-atropine (LOMOTIL) 2.5-0.025 MG tablet Take 1 tablet by mouth every 6 (six) hours as needed for diarrhea or loose stools. 60 tablet 1   glucose blood (FREESTYLE LITE) test strip Check blood sugar 1-3x daily 100 each 12   Lancets (FREESTYLE) lancets Check blood sugar once daily 100 each 12   lisinopril (ZESTRIL) 40 MG tablet Take 1.5 tablets (60 mg total) by mouth daily. 135 tablet 1   meloxicam (MOBIC) 15 MG tablet Take 1 tablet (15 mg total) by mouth daily as needed for pain. 90 tablet 1   metFORMIN (GLUCOPHAGE) 1000 MG tablet Take 1 tablet (1,000 mg total) by mouth 2 (two) times daily with a meal. 180 tablet 3   methocarbamol (ROBAXIN) 500 MG tablet Take 1 tablet (500 mg total) by mouth every 8 (eight) hours as needed for muscle spasms. Use for back pain 30 tablet 0   MULTIPLE VITAMIN PO Take 1 tablet by mouth daily.      niacin 500 MG tablet Take 500 mg by mouth every morning.      Omega-3 Fatty Acids (EQL OMEGA 3 FISH OIL) 1000 MG CAPS Take 1 capsule by mouth 2 (two) times daily.     pantoprazole (PROTONIX) 40 MG tablet Take 1 tablet (40 mg total) by mouth daily. 90 tablet 3   prednisoLONE acetate (PRED FORTE) 1 % ophthalmic suspension      rosuvastatin (CRESTOR) 40 MG tablet Take 1 tablet (40 mg total) by mouth daily. 90 tablet 3   spironolactone (ALDACTONE) 25 MG tablet TAKE 1 TABLET BY MOUTH EVERY DAY 90 tablet 2   No current facility-administered medications on file prior to visit.    Review of Systems:  As per HPI- otherwise negative.   Physical Examination: There were no vitals filed for this visit. There were no vitals filed for this visit. There is no height or weight on file to calculate BMI. Ideal Body Weight:    GEN: no acute distress. HEENT: Atraumatic, Normocephalic.  Ears and  Nose: No external deformity. CV: RRR, No M/G/R. No JVD. No thrill. No extra heart sounds. PULM: CTA B, no wheezes, crackles, rhonchi. No retractions. No resp. distress. No accessory muscle use. ABD: S, NT, ND, +BS. No rebound. No HSM. EXTR: No c/c/e PSYCH: Normally interactive. Conversant. '  Assessment and Plan: ***  This visit occurred during  the SARS-CoV-2 public health emergency.  Safety protocols were in place, including screening questions prior to the visit, additional usage of staff PPE, and extensive cleaning of exam room while observing appropriate contact time as indicated for disinfecting solutions.   Signed Lamar Blinks, MD

## 2021-04-29 ENCOUNTER — Encounter: Payer: Self-pay | Admitting: Family Medicine

## 2021-04-30 DIAGNOSIS — N3946 Mixed incontinence: Secondary | ICD-10-CM | POA: Diagnosis not present

## 2021-04-30 DIAGNOSIS — N401 Enlarged prostate with lower urinary tract symptoms: Secondary | ICD-10-CM | POA: Diagnosis not present

## 2021-04-30 DIAGNOSIS — R351 Nocturia: Secondary | ICD-10-CM | POA: Diagnosis not present

## 2021-04-30 DIAGNOSIS — R3915 Urgency of urination: Secondary | ICD-10-CM | POA: Diagnosis not present

## 2021-05-06 ENCOUNTER — Ambulatory Visit: Payer: Medicare HMO | Admitting: Family Medicine

## 2021-05-14 NOTE — Progress Notes (Addendum)
[facility] at [facility] [place], [place], [place] [address] [phone]  Date:  05/20/2021   Name:  David Singh   DOB:  1951-04-12   MRN:  [id number]  PCP:  Darreld Mclean, MD    Chief Complaint: Annual Exam (cpe)   History of Present Illness:  David Singh is a 70 y.o. very pleasant male patient who presents with the following:  Pt seen today for a CPE- History of diabetes, CAD with stent, hyperlipidemia, hypertension, significant peripheral arterial disease Last visit with myself March He was seen by endocrinology in May of this year - A1c has been under good control   Lab Results  Component Value Date   HGBA1C 6.5 (A) 03/10/2021   Covid series- pt has refused  Shingles vaccine -we discussed today, I encouraged him to get this done He did have Zostavax  back in 2016  Amlodipine Aspirin 81 BuSpar 15 twice daily Carvedilol Clonidine Lisinopril 40 Metformin 1000 twice daily Crestor Spironolactone  He notes that his legs continue to hurt with his PVD Sometimes they feel achy at night and he wants to keep moving them He is able to walk about 50 yards before he has leg pain. He will then stop and rest until the pain resolves- this may take a few seconds to several minutes.  Gradually getting worse but no major change He is seeing vascular surgery annually- visit coming up  However for right now they prefer conservative management   His right knee is bothering him as well   He has an enlarged prostate and his urologist has recommended doing surgery- however he has not pursued this yet.   He tried some medication for BPH in the past but does not remember what it was. He notes that he has urinary frequncy due to incomplete emptying He may have leakage if he cannot get to the bathroom right leg He is getting up 3-4x at night and has difficulty emptying his bladder especially at night  [name]  does admit to some depression symptoms.  We discussed this together.  At this time he does not wish to use any medication, he denies any suicidal ideation.  He will let me know if things get worse Patient Active Problem List   Diagnosis Date Noted   Primary osteoarthritis of right knee 03/03/2020   Chronic gout 03/09/2016   Hypertriglyceridemia 01/16/2015   Type 2 diabetes mellitus ([place]) 01/06/2015   CAD in native artery 01/06/2015   History of coronary artery stent placement 01/06/2015   PAD (peripheral artery disease) ([place]) 01/06/2015   Hyperlipidemia 01/06/2015   Essential hypertension 01/06/2015    Past Medical History:  Diagnosis Date   CAD (coronary artery disease)    Cancer ([place])    basal cell excision upper right ear 7 yrs ago   Complication of anesthesia    bad pain with anesthesia induction with colonscopy 6 yrs ago   Diabetes mellitus without complication (H[facility])    type 2   GERD (gastroesophageal reflux disease)    Hyperlipidemia    Hypertension    Peripheral vascular disease ([place])    partial clogged artery right leg   Sleep apnea    yrs ago sleep study no cpap used    Past Surgical History:  Procedure Laterality Date   ABDOMINAL AORTOGRAM W/LOWER EXTREMITY N/A 12/27/2016   Procedure: Abdominal Aortogram w/Lower Extremity;  Surgeon: Angelia Mould, MD;  Location:  [facility];  Service: Cardiovascular;  Laterality: N/A;   CARDIAC SURGERY     CORONARY ANGIOPLASTY WITH STENT PLACEMENT  11/2004   in [place] Right    NASAL SINUS SURGERY  1976   TONSILLECTOMY      Social History   Tobacco Use   Smoking status: Former    Packs/day: 1.50    Years: 26.00    Pack years: 39.00    Types: Cigarettes    Quit date: 1994    Years since quitting: 28.5   Smokeless tobacco: Former  Scientific laboratory technician Use: Never used  Substance Use Topics   Alcohol use: Yes    Alcohol/week: 0.0 standard drinks    Comment: 3 ounces on monday   Drug use:  No    Family History  Problem Relation Age of Onset   Alcohol abuse Brother    Cancer Brother    Diabetes Brother    Hyperlipidemia Brother    Hypertension Brother    Diabetes Mother    Hypertension Mother    Hyperlipidemia Mother    Heart attack Mother    Hyperlipidemia Father    Hypertension Father     Allergies  Allergen Reactions   Dexilant [Dexlansoprazole]     Pressure in stomach and chest    Medication list has been reviewed and updated.  Current Outpatient Medications on File Prior to Visit  Medication Sig Dispense Refill   amLODipine (NORVASC) 10 MG tablet TAKE 1 TABLET EVERY DAY 90 tablet 3   aspirin 81 MG tablet Take 81 mg by mouth daily.     Blood Glucose Monitoring Suppl (FREESTYLE LITE) DEVI CHECK BLOOD SUGAR ONCE DAILY  0   Blood Glucose Monitoring Suppl (FREESTYLE LITE) DEVI CHECK BLOOD SUGAR ONCE DAILY 1 each 0   busPIRone (BUSPAR) 15 MG tablet TAKE ONE TABLET BY MOUTH TWICE A DAY 180 tablet 1   carvedilol (COREG) 25 MG tablet TAKE 1 TABLET BY MOUTH TWICE A DAY (Patient taking differently: Take 25 mg by mouth daily with breakfast.) 180 tablet 3   Cholecalciferol (VITAMIN D3) 2000 units TABS Take 2,000 Units by mouth daily.     cloNIDine (CATAPRES) 0.2 MG tablet Take 1 tablet (0.2 mg total) by mouth 2 (two) times daily. 180 tablet 3   Coenzyme Q10 300 MG CAPS Take 300 mg by mouth daily.      Cyanocobalamin (VITAMIN B-12 PO) Take 2,500 mg by mouth daily.     diphenoxylate-atropine (LOMOTIL) 2.5-0.025 MG tablet Take 1 tablet by mouth every 6 (six) hours as needed for diarrhea or loose stools. 60 tablet 1   glucose blood (FREESTYLE LITE) test strip Check blood sugar 1-3x daily 100 each 12   Lancets (FREESTYLE) lancets Check blood sugar once daily 100 each 12   lisinopril (ZESTRIL) 40 MG tablet Take 1.5 tablets (60 mg total) by mouth daily. 135 tablet 1   meloxicam (MOBIC) 15 MG tablet Take 1 tablet (15 mg total) by mouth daily as needed for pain. 90 tablet 1    metFORMIN (GLUCOPHAGE) 1000 MG tablet Take 1 tablet (1,000 mg total) by mouth 2 (two) times daily with a meal. 180 tablet 3   MULTIPLE VITAMIN PO Take 1 tablet by mouth daily.      niacin 500 MG tablet Take 500 mg by mouth every morning.      Omega-3 Fatty Acids (EQL OMEGA 3 FISH OIL) 1000 MG CAPS Take 1 capsule by mouth 2 (  two) times daily.     pantoprazole (PROTONIX) 40 MG tablet Take 1 tablet (40 mg total) by mouth daily. 90 tablet 3   prednisoLONE acetate (PRED FORTE) 1 % ophthalmic suspension      rosuvastatin (CRESTOR) 40 MG tablet Take 1 tablet (40 mg total) by mouth daily. 90 tablet 3   spironolactone (ALDACTONE) 25 MG tablet TAKE 1 TABLET BY MOUTH EVERY DAY 90 tablet 2   No current facility-administered medications on file prior to visit.    Review of Systems:  As per HPI- otherwise negative.   Physical Examination: Vitals:   05/20/21 1015  BP: 132/70  Pulse: 71  Temp: 97.9 F (36.6 C)  SpO2: 97%   Vitals:   05/20/21 1015  Weight: 281 lb 12.8 oz (127.8 kg)  Height: 5\' 9"  (1.753 m)   Body mass index is 41.61 kg/m. Ideal Body Weight: Weight in (lb) to have BMI = 25: 168.9  GEN: no acute distress.  Obese, looks well  HEENT: Atraumatic, Normocephalic.   Bilateral TM wnl, oropharynx normal.  PEERL,EOMI.   Ears and Nose: No external deformity. CV: RRR, No M/G/R. No JVD. No thrill. No extra heart sounds. PULM: CTA B, no wheezes, crackles, rhonchi. No retractions. No resp. distress. No accessory muscle use. ABD: S, NT, ND, +BS. No rebound. No HSM. EXTR: No c/c/e PSYCH: Normally interactive. Conversant.  I am not able to palpate pulses in either foot   Assessment and Plan: Physical exam  Dyslipidemia - Plan: Lipid panel  Essential hypertension - Plan: Comprehensive metabolic panel  PAD (peripheral artery disease) (HCC)  Benign prostatic hyperplasia with incomplete bladder emptying - Plan: tamsulosin (FLOMAX) 0.4 MG CAPS capsule  Depressed mood  Type 2  diabetes mellitus without complication, without long-term current use of insulin ([place])  Physical exam today.  I encouraged healthy diet and exercise routine as possible We have encouraged COVID-19 vaccination as well as Shingrix  We will try Flomax for his BPH symptoms.  I have asked him to let me know how this works for him Will plan further follow- up pending labs.  Endocrinology is currently handling his diabetes.  He brings me a readout of recent blood sugar checks at home, typically less than 150 This visit occurred during the SARS-CoV-2 public health emergency.  Safety protocols were in place, including screening questions prior to the visit, additional usage of staff PPE, and extensive cleaning of exam room while observing appropriate contact time as indicated for disinfecting solutions.   Signed Lamar Blinks, MD  Received labs as below, message to patient Results for orders placed or performed in visit on 05/20/21  Lipid panel  Result Value Ref Range   Cholesterol 97 0 - 200 mg/dL   Triglycerides 174.0 (H) 0.0 - 149.0 mg/dL   HDL 30.80 (L) >39.00 mg/dL   VLDL 34.8 0.0 - 40.0 mg/dL   LDL Cholesterol 32 0 - 99 mg/dL   Total CHOL/HDL Ratio 3    NonHDL 66.32   Comprehensive metabolic panel  Result Value Ref Range   Sodium 135 135 - 145 mEq/L   Potassium 4.7 3.5 - 5.1 mEq/L   Chloride 101 96 - 112 mEq/L   CO2 24 19 - 32 mEq/L   Glucose, Bld 127 (H) 70 - 99 mg/dL   BUN 13 6 - 23 mg/dL   Creatinine, Ser 0.91 0.40 - 1.50 mg/dL   Total Bilirubin 0.4 0.2 - 1.2 mg/dL   Alkaline Phosphatase 36 (L) 39 - 117 U/L  AST 19 0 - 37 U/L   ALT 20 0 - 53 U/L   Total Protein 6.8 6.0 - 8.3 g/dL   Albumin 4.3 3.5 - 5.2 g/dL   GFR 86.05 >60.00 mL/min   Calcium 9.7 8.4 - 10.5 mg/dL

## 2021-05-15 DIAGNOSIS — I10 Essential (primary) hypertension: Secondary | ICD-10-CM | POA: Diagnosis not present

## 2021-05-15 DIAGNOSIS — E119 Type 2 diabetes mellitus without complications: Secondary | ICD-10-CM | POA: Diagnosis not present

## 2021-05-17 IMAGING — DX DG CHEST 1V PORT
1 series · 1 of 1 positions shown · non-contrast
Comparison: Chest radiograph dated 08/18/2016.

CLINICAL DATA: 68-year-old male with chest pain.

EXAM:
PORTABLE CHEST 1 VIEW

[chest ap]
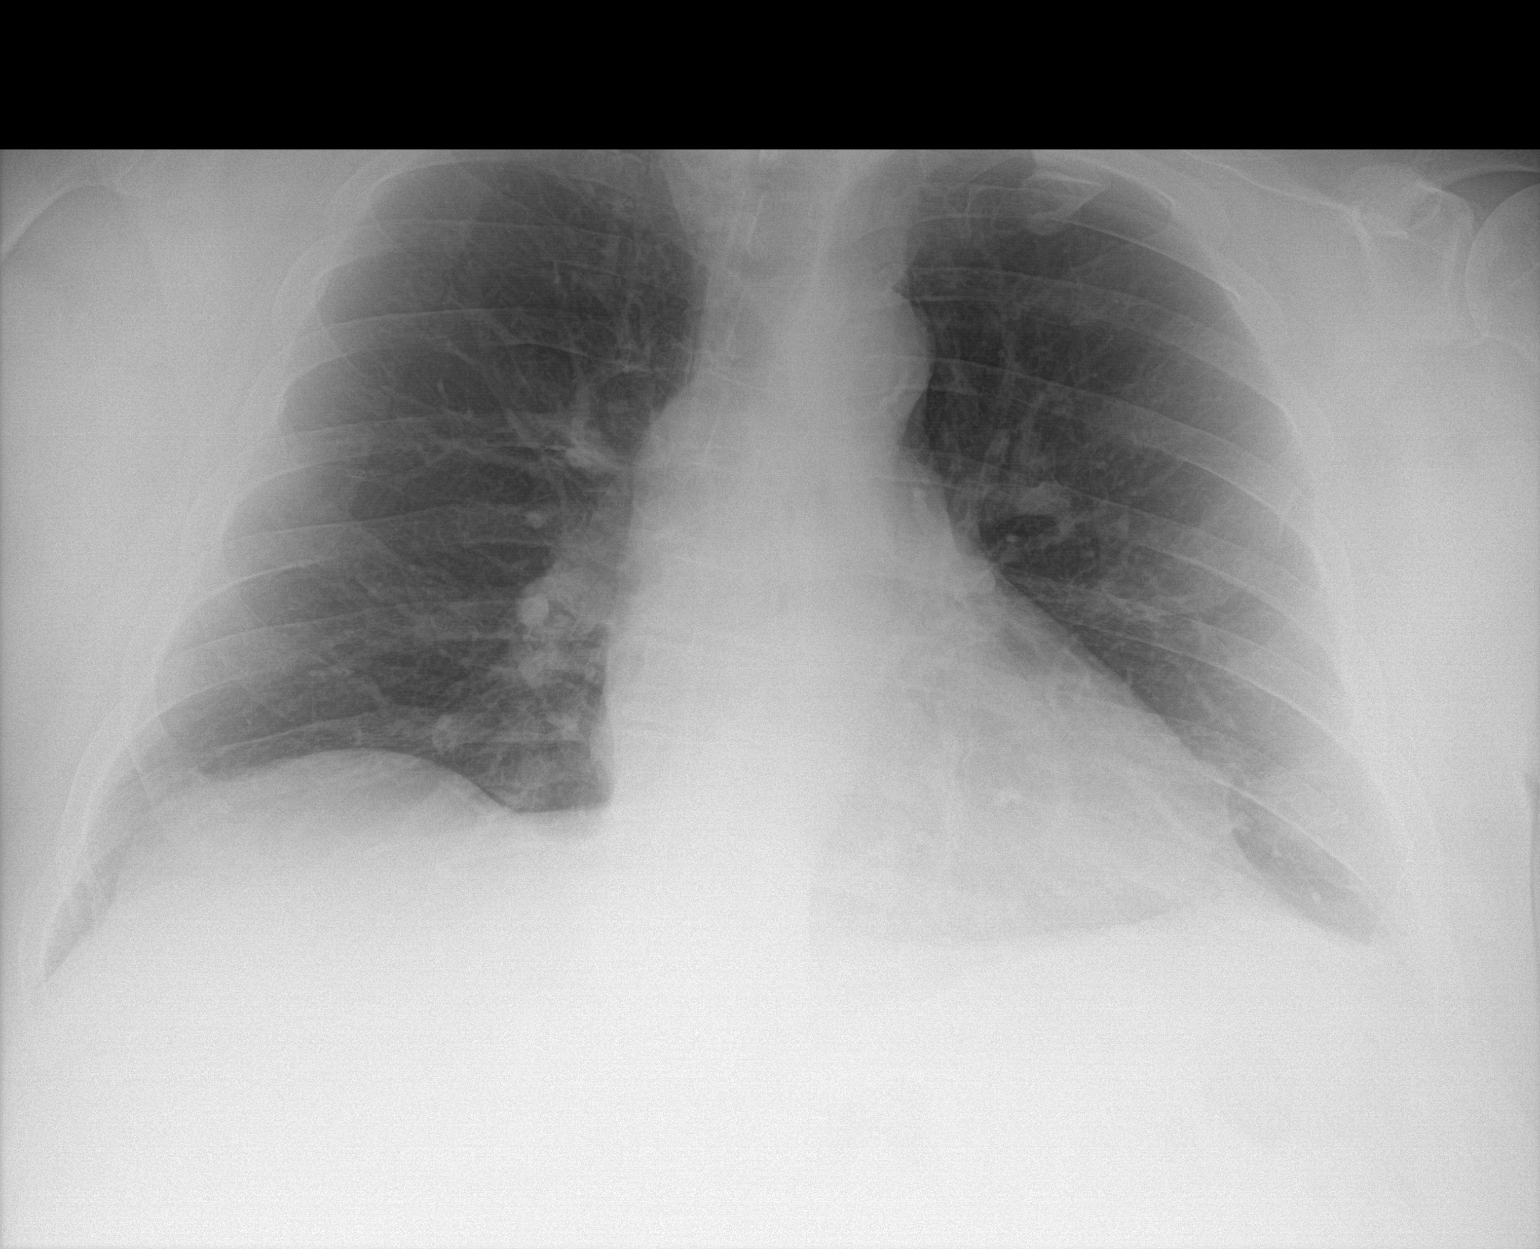

[1 of 1 positions shown; findings below may reference images not displayed]

FINDINGS: No focal consolidation, pleural effusion, pneumothorax. The cardiac
silhouette is within normal limits. Atherosclerotic calcification of
the aortic arch. No acute osseous pathology.
IMPRESSION: No active disease.

## 2021-05-20 ENCOUNTER — Other Ambulatory Visit: Payer: Self-pay

## 2021-05-20 ENCOUNTER — Ambulatory Visit (INDEPENDENT_AMBULATORY_CARE_PROVIDER_SITE_OTHER): Payer: Medicare HMO | Admitting: Family Medicine

## 2021-05-20 ENCOUNTER — Encounter: Payer: Self-pay | Admitting: Family Medicine

## 2021-05-20 VITALS — BP 132/70 | HR 71 | Temp 97.9°F | Ht 69.0 in | Wt 281.8 lb

## 2021-05-20 DIAGNOSIS — R4589 Other symptoms and signs involving emotional state: Secondary | ICD-10-CM

## 2021-05-20 DIAGNOSIS — I10 Essential (primary) hypertension: Secondary | ICD-10-CM | POA: Diagnosis not present

## 2021-05-20 DIAGNOSIS — E119 Type 2 diabetes mellitus without complications: Secondary | ICD-10-CM

## 2021-05-20 DIAGNOSIS — R3914 Feeling of incomplete bladder emptying: Secondary | ICD-10-CM | POA: Diagnosis not present

## 2021-05-20 DIAGNOSIS — E785 Hyperlipidemia, unspecified: Secondary | ICD-10-CM | POA: Diagnosis not present

## 2021-05-20 DIAGNOSIS — I739 Peripheral vascular disease, unspecified: Secondary | ICD-10-CM

## 2021-05-20 DIAGNOSIS — Z0000 Encounter for general adult medical examination without abnormal findings: Secondary | ICD-10-CM | POA: Diagnosis not present

## 2021-05-20 DIAGNOSIS — N401 Enlarged prostate with lower urinary tract symptoms: Secondary | ICD-10-CM

## 2021-05-20 DIAGNOSIS — R69 Illness, unspecified: Secondary | ICD-10-CM | POA: Diagnosis not present

## 2021-05-20 LAB — COMPREHENSIVE METABOLIC PANEL
ALT: 20 U/L (ref 0–53)
AST: 19 U/L (ref 0–37)
Albumin: 4.3 g/dL (ref 3.5–5.2)
Alkaline Phosphatase: 36 U/L — ABNORMAL LOW (ref 39–117)
BUN: 13 mg/dL (ref 6–23)
CO2: 24 mEq/L (ref 19–32)
Calcium: 9.7 mg/dL (ref 8.4–10.5)
Chloride: 101 mEq/L (ref 96–112)
Creatinine, Ser: 0.91 mg/dL (ref 0.40–1.50)
GFR: 86.05 mL/min (ref >60.00)
Glucose, Bld: 127 mg/dL — ABNORMAL HIGH (ref 70–99)
Potassium: 4.7 mEq/L (ref 3.5–5.1)
Sodium: 135 mEq/L (ref 135–145)
Total Bilirubin: 0.4 mg/dL (ref 0.2–1.2)
Total Protein: 6.8 g/dL (ref 6.0–8.3)

## 2021-05-20 LAB — LIPID PANEL
Cholesterol: 97 mg/dL (ref 0–200)
HDL: 30.8 mg/dL — ABNORMAL LOW (ref >39.00)
LDL Cholesterol: 32 mg/dL (ref 0–99)
NonHDL: 66.32
Total CHOL/HDL Ratio: 3
Triglycerides: 174 mg/dL — ABNORMAL HIGH (ref 0.0–149.0)
VLDL: 34.8 mg/dL (ref 0.0–40.0)

## 2021-05-20 MED ORDER — TAMSULOSIN HCL 0.4 MG PO CAPS: 0.4000 mg | ORAL_CAPSULE | Freq: Every day | ORAL | 3 refills | Status: DC

## 2021-05-20 NOTE — Patient Instructions (Signed)
Good to see you again today-I will be in touch with your labs as soon as possible  You can get the new and improved shingles vaccine, called Shingrix, at your pharmacy at your convenience.  It is a 2 shot series  I sent in an prescription for Flomax 0.4 mg, this may help with your urinary symptoms.  Try taking 1 a day, if you wish to try increasing to 2 caps after 2 weeks you can do so.  Let me know how this does for you!    We discussed you feeling a bit down today - please let me know if this is getting worse or not improving

## 2021-05-24 DIAGNOSIS — I10 Essential (primary) hypertension: Secondary | ICD-10-CM | POA: Diagnosis not present

## 2021-05-24 DIAGNOSIS — E119 Type 2 diabetes mellitus without complications: Secondary | ICD-10-CM | POA: Diagnosis not present

## 2021-05-25 ENCOUNTER — Encounter: Payer: Self-pay | Admitting: Family Medicine

## 2021-05-28 ENCOUNTER — Other Ambulatory Visit: Payer: Self-pay | Admitting: Family Medicine

## 2021-05-28 DIAGNOSIS — R3914 Feeling of incomplete bladder emptying: Secondary | ICD-10-CM

## 2021-05-28 DIAGNOSIS — N401 Enlarged prostate with lower urinary tract symptoms: Secondary | ICD-10-CM

## 2021-06-02 ENCOUNTER — Ambulatory Visit (INDEPENDENT_AMBULATORY_CARE_PROVIDER_SITE_OTHER): Payer: Medicare HMO

## 2021-06-02 VITALS — Ht 69.0 in | Wt 281.0 lb

## 2021-06-02 DIAGNOSIS — Z0000 Encounter for general adult medical examination without abnormal findings: Secondary | ICD-10-CM | POA: Diagnosis not present

## 2021-06-02 NOTE — Progress Notes (Signed)
Subjective:   David Singh is a 70 y.o. male who presents for Medicare Annual/Subsequent preventive examination.  I connected with Wilferd today by telephone and verified that I am speaking with the correct person using two identifiers. Location patient: home Location provider: work Persons participating in the virtual visit: patient, Marine scientist.    I discussed the limitations, risks, security and privacy concerns of performing an evaluation and management service by telephone and the availability of in person appointments. I also discussed with the patient that there may be a patient responsible charge related to this service. The patient expressed understanding and verbally consented to this telephonic visit.    Interactive audio and video telecommunications were attempted between this provider and patient, however failed, due to patient having technical difficulties OR patient did not have access to video capability.  We continued and completed visit with audio only.  Some vital signs may be absent or patient reported.   Time Spent with patient on telephone encounter: 25 minutes   Review of Systems     Cardiac Risk Factors include: advanced age (>60mn, >>40women);diabetes mellitus;dyslipidemia;hypertension;obesity (BMI >30kg/m2);sedentary lifestyle     Objective:    Today's Vitals   06/02/21 1102 06/02/21 1103  Weight: 281 lb (127.5 kg)   Height: '5\' 9"'$  (1.753 m)   PainSc:  7    Body mass index is 41.5 kg/m.  Advanced Directives 06/02/2021 05/30/2020 11/10/2019 12/28/2017 06/29/2017 12/27/2016 12/22/2016  Does Patient Have a Medical Advance Directive? Yes Yes No Yes Yes Yes Yes  Type of AParamedicof [place]isk[place] will [place]Living will - [place]ethuen TownLiving will - [place]La Plata[place] will Living will  Does patient want to make changes to medical advance directive? - No - Patient declined - - - No -  Patient declined -  Copy of [place]henando[place] Chart? No - copy requested No - copy requested - - - No - copy requested -  Would patient like information on creating a medical advance directive? - - No - Patient declined - - - -    Current Medications (verified) Outpatient Encounter Medications as of 06/02/2021  Medication Sig   amLODipine (NORVASC) 10 MG tablet TAKE 1 TABLET EVERY DAY   aspirin 81 MG tablet Take 81 mg by mouth daily.   Blood Glucose Monitoring Suppl (FREESTYLE LITE) DEVI CHECK BLOOD SUGAR ONCE DAILY   Blood Glucose Monitoring Suppl (FREESTYLE LITE) DEVI CHECK BLOOD SUGAR ONCE DAILY   busPIRone (BUSPAR) 15 MG tablet TAKE ONE TABLET BY MOUTH TWICE A DAY   carvedilol (COREG) 25 MG tablet TAKE 1 TABLET BY MOUTH TWICE A DAY (Patient taking differently: Take 25 mg by mouth daily with breakfast.)   Cholecalciferol (VITAMIN D3) 2000 units TABS Take 2,000 Units by mouth daily.   cloNIDine (CATAPRES) 0.2 MG tablet Take 1 tablet (0.2 mg total) by mouth 2 (two) times daily.   Coenzyme Q10 300 MG CAPS Take 300 mg by mouth daily.    Cyanocobalamin (VITAMIN B-12 PO) Take 2,500 mg by mouth daily.   diphenoxylate-atropine (LOMOTIL) 2.5-0.025 MG tablet Take 1 tablet by mouth every 6 (six) hours as needed for diarrhea or loose stools.   glucose blood (FREESTYLE LITE) test strip Check blood sugar 1-3x daily   Lancets (FREESTYLE) lancets Check blood sugar once daily   meloxicam (MOBIC) 15 MG tablet Take 1 tablet (15 mg total) by mouth daily as needed for pain.   metFORMIN (GLUCOPHAGE)  1000 MG tablet Take 1 tablet (1,000 mg total) by mouth 2 (two) times daily with a meal.   MULTIPLE VITAMIN PO Take 1 tablet by mouth daily.    niacin 500 MG tablet Take 500 mg by mouth every morning.    Omega-3 Fatty Acids (EQL OMEGA 3 FISH OIL) 1000 MG CAPS Take 1 capsule by mouth 2 (two) times daily.   pantoprazole (PROTONIX) 40 MG tablet Take 1 tablet (40 mg total) by mouth daily.   prednisoLONE  acetate (PRED FORTE) 1 % ophthalmic suspension    rosuvastatin (CRESTOR) 40 MG tablet Take 1 tablet (40 mg total) by mouth daily.   spironolactone (ALDACTONE) 25 MG tablet TAKE 1 TABLET BY MOUTH EVERY DAY   tamsulosin (FLOMAX) 0.4 MG CAPS capsule TAKE 1 CAPSULE (0.4 MG TOTAL) BY MOUTH DAILY. MAY INCREASE TO 2 CAPSULES AFTER 2 WEEKS IF NEEDED FOR PROSTATE SYMPTOMS   lisinopril (ZESTRIL) 40 MG tablet Take 1.5 tablets (60 mg total) by mouth daily. (Patient not taking: Reported on 06/02/2021)   No facility-administered encounter medications on file as of 06/02/2021.    Allergies (verified) Dexilant [dexlansoprazole]   History: Past Medical History:  Diagnosis Date   CAD (coronary artery disease)    Cancer ([place])    basal cell excision upper right ear 7 yrs ago   Complication of anesthesia    bad pain with anesthesia induction with colonscopy 6 yrs ago   Diabetes mellitus without complication ([facility])    type 2   GERD (gastroesophageal reflux disease)    Hyperlipidemia    Hypertension    Peripheral vascular disease ([place])    partial clogged artery right leg   Sleep apnea    yrs ago sleep study no cpap used   Past Surgical History:  Procedure Laterality Date   ABDOMINAL AORTOGRAM W/LOWER EXTREMITY N/A 12/27/2016   Procedure: Abdominal Aortogram w/Lower Extremity;  Surgeon: Angelia Mould, MD;  Location: [facility];  Service: Cardiovascular;  Laterality: N/A;   CARDIAC SURGERY     CORONARY ANGIOPLASTY WITH STENT PLACEMENT  11/2004   in [place] Right    NASAL SINUS SURGERY  1976   TONSILLECTOMY     Family History  Problem Relation Age of Onset   Alcohol abuse Brother    Cancer Brother    Diabetes Brother    Hyperlipidemia Brother    Hypertension Brother    Diabetes Mother    Hypertension Mother    Hyperlipidemia Mother    Heart attack Mother    Hyperlipidemia Father    Hypertension Father    Social History   Socioeconomic History   Marital  status: Married    Spouse name: Not on file   Number of children: 2   Years of education: Not on file   Highest education level: Not on file  Occupational History   Not on file  Tobacco Use   Smoking status: Former    Packs/day: 1.50    Years: 26.00    Pack years: 39.00    Types: Cigarettes    Quit date: 1994    Years since quitting: 28.6   Smokeless tobacco: Former  Scientific laboratory technician Use: Never used  Substance and Sexual Activity   Alcohol use: Yes    Alcohol/week: 0.0 standard drinks    Comment: 3 ounces on monday   Drug use: No   Sexual activity: Yes  Other Topics Concern   Not on file  Social History  Narrative   Not on file   Social Determinants of Health   Financial Resource Strain: Low Risk    Difficulty of Paying Living Expenses: Not hard at all  Food Insecurity: No Food Insecurity   Worried About Charity fundraiser in the Last Year: Never true   [place] in the Last Year: Never true  Transportation Needs: No Transportation Needs   Lack of Transportation (Medical): No   Lack of Transportation (Non-Medical): No  Physical Activity: Inactive   Days of Exercise per Week: 0 days   Minutes of Exercise per Session: 0 min  Stress: No Stress Concern Present   Feeling of Stress : Only a little  Social Connections: Moderately Isolated   Frequency of Communication with Friends and Family: More than three times a week   Frequency of Social Gatherings with Friends and Family: More than three times a week   Attends Religious Services: Never   Marine scientist or Organizations: No   Attends Music therapist: Never   Marital Status: Married    Tobacco Counseling Counseling given: Not Answered   Clinical Intake:  Pre-visit preparation completed: Yes  Pain : 0-10 Pain Score: 7  Pain Type: Chronic pain Pain Location: Leg Pain Orientation: Right Pain Onset: More than a month ago Pain Frequency: Constant     Nutritional Status: BMI  > 30  Obese Nutritional Risks: None Diabetes: Yes CBG done?: No Did pt. bring in CBG monitor from home?: No (phone visit)  How often do you need to have someone help you when you read instructions, pamphlets, or other written materials from your doctor or pharmacy?: 1 - Never  Diabetes:  Is the patient diabetic?  Yes  If diabetic, was a CBG obtained today?  No  Did the patient bring in their glucometer from home?  No phone visit How often do you monitor your CBG's? daily.   Financial Strains and Diabetes Management:  Are you having any financial strains with the device, your supplies or your medication? No .  Does the patient want to be seen by Chronic Care Management for management of their diabetes?  No  Would the patient like to be referred to a Nutritionist or for Diabetic Management?  No   Diabetic Exams:  Diabetic Eye Exam: . Overdue for diabetic eye exam. Pt has been advised about the importance in completing this exam. Patient has an upcoming appt in October  Diabetic Foot Exam: Completed 12/31/2020.    Interpreter Needed?: No  Information entered by :: Caroleen Hamman LPN   Activities of Daily Living In your present state of health, do you have any difficulty performing the following activities: 06/02/2021  Hearing? N  Vision? N  Difficulty concentrating or making decisions? N  Walking or climbing stairs? N  Dressing or bathing? N  Doing errands, shopping? N  Preparing Food and eating ? N  Using the Toilet? N  In the past six months, have you accidently leaked urine? N  Do you have problems with loss of bowel control? N  Managing your Medications? N  Managing your Finances? N  Housekeeping or managing your Housekeeping? N  Some recent data might be hidden    Patient Care Team: Copland, Gay Filler, MD as PCP - General (Family Medicine) Stanford Breed Denice Bors, MD as Consulting Physician (Cardiology) Alyson Ingles Candee Furbish, MD as Consulting Physician  (Urology)  Indicate any recent Medical Services you may have received from other than [facility] providers  in the past year (date may be approximate).     Assessment:   This is a routine wellness examination for [place].  Hearing/Vision screen Hearing Screening - Comments:: Bilateral hearing aids Vision Screening - Comments:: Wears glasses Last eye exam-06/2020  Dietary issues and exercise activities discussed: Current Exercise Habits: The patient does not participate in regular exercise at present, Exercise limited by: None identified   Goals Addressed             This Visit's Progress    Increase physical activity   Not on track    Patient Stated       Maintain current health       Depression Screen PHQ 2/9 Scores 06/02/2021 05/20/2021 05/30/2020 02/18/2020 10/06/2017 09/29/2016  PHQ - 2 Score 0 4 0 0 1 0  PHQ- 9 Score - 11 - - - -    Fall Risk Fall Risk  06/02/2021 05/20/2021 05/30/2020 02/18/2020 10/06/2017  Falls in the past year? 0 0 0 0 No  Number falls in past yr: 0 0 0 - -  Injury with Fall? 0 0 0 - -  Risk for fall due to : - No Fall Risks - - -  Follow up Falls prevention discussed Falls evaluation completed Education provided;Falls prevention discussed - -    FALL RISK PREVENTION PERTAINING TO THE HOME:  Any stairs in or around the home? Yes  If so, are there any without handrails? No  Home free of loose throw rugs in walkways, pet beds, electrical cords, etc? Yes  Adequate lighting in your home to reduce risk of falls? Yes   ASSISTIVE DEVICES UTILIZED TO PREVENT FALLS:  Life alert? No  Use of a cane, walker or w/c? No  Grab bars in the bathroom? Yes  Shower chair or bench in shower? No  Elevated toilet seat or a handicapped toilet? No   TIMED UP AND GO:  Was the test performed? No . Phone visit   Cognitive Function:Normal cognitive status assessed by  this Nurse Health Advisor. No abnormalities found.          Immunizations Immunization History   Administered Date(s) Administered   Influenza Split 06/11/2020   Influenza, High Dose Seasonal PF 06/23/2017, 06/25/2018, 06/14/2019   Influenza-Unspecified 07/24/2015, 07/22/2016, 06/23/2017   Pneumococcal Conjugate-13 01/10/2017   Pneumococcal Polysaccharide-23 05/22/2018   Tdap 12/09/2016   Zoster, Live 07/24/2015    TDAP status: Up to date  Flu Vaccine status: Up to date  Pneumococcal vaccine status: Up to date  Covid-19 vaccine status: Declined, Education has been provided regarding the importance of this vaccine but patient still declined. Advised may receive this vaccine at local pharmacy or Health Dept.or vaccine clinic. Aware to provide a copy of the vaccination record if obtained from local pharmacy or Health Dept. Verbalized acceptance and understanding.  Qualifies for Shingles Vaccine? Yes   Zostavax completed Yes   Shingrix Completed?: No.    Education has been provided regarding the importance of this vaccine. Patient has been advised to call insurance company to determine out of pocket expense if they have not yet received this vaccine. Advised may also receive vaccine at local pharmacy or Health Dept. Verbalized acceptance and understanding.  Screening Tests Health Maintenance  Topic Date Due   COVID-19 Vaccine (1) Never done   Zoster Vaccines- Shingrix (1 of 2) Never done   INFLUENZA VACCINE  05/25/2021   OPHTHALMOLOGY EXAM  05/27/2021   HEMOGLOBIN A1C  09/10/2021   FOOT EXAM  12/31/2021   Fecal DNA (Cologuard)  07/04/2023   TETANUS/TDAP  12/09/2026   Hepatitis C Screening  Completed   PNA vac Low Risk Adult  Completed   HPV VACCINES  Aged Out    Health Maintenance  Health Maintenance Due  Topic Date Due   COVID-19 Vaccine (1) Never done   Zoster Vaccines- Shingrix (1 of 2) Never done   INFLUENZA VACCINE  05/25/2021   OPHTHALMOLOGY EXAM  05/27/2021    Colorectal cancer screening: Type of screening: Cologuard. Completed 07/03/2020. Repeat every 3  years  Lung Cancer Screening: (Low Dose CT Chest recommended if Age 67-80 years, 30 pack-year currently smoking OR have quit w/in 15years.) does not qualify.     Additional Screening:  Hepatitis C Screening: Completed 12/09/2016  Vision Screening: Recommended annual ophthalmology exams for early detection of glaucoma and other disorders of the eye. Is the patient up to date with their annual eye exam?  No  Who is the provider or what is the name of the office in which the patient attends annual eye exams? Dr. Antionette Fairy   Dental Screening: Recommended annual dental exams for proper oral hygiene  Community Resource Referral / Chronic Care Management: CRR required this visit?  No   CCM required this visit?  No      Plan:     I have personally reviewed and noted the following in the patient's chart:   Medical and social history Use of alcohol, tobacco or illicit drugs  Current medications and supplements including opioid prescriptions. Patient is not currently taking opioid prescriptions. Functional ability and status Nutritional status Physical activity Advanced directives List of other physicians Hospitalizations, surgeries, and ER visits in previous 12 months Vitals Screenings to include cognitive, depression, and falls Referrals and appointments  In addition, I have reviewed and discussed with patient certain preventive protocols, quality metrics, and best practice recommendations. A written personalized care plan for preventive services as well as general preventive health recommendations were provided to patient.   Due to this being a telephonic visit, the after visit summary with patients personalized plan was offered to patient via mail or my-chart. Patient would like to access on my-chart.   Marta Antu, LPN   [id number]  Nurse Health Advisor  Nurse Notes: None

## 2021-06-02 NOTE — Patient Instructions (Signed)
David Singh , Thank you for taking time to complete your Medicare Wellness Visit. I appreciate your ongoing commitment to your health goals. Please review the following plan we discussed and let me know if I can assist you in the future.   Screening recommendations/referrals: Colonoscopy: Cologuard completed 07/03/2020-Due 07/04/2023 Recommended yearly ophthalmology/optometry visit for glaucoma screening and checkup Recommended yearly dental visit for hygiene and checkup  Vaccinations: Influenza vaccine: Due 06/2021 Pneumococcal vaccine: Up to date Tdap vaccine: Up to date-Due-12/09/2026 Shingles vaccine: Discuss with pharmacy   Covid-19: Declined  Advanced directives: Please bring a copy for your chart  Conditions/risks identified: See problem list  Next appointment: Follow up in one year for your annual wellness visit. 06/08/2022 @ 9:40  Preventive Care 69 Years and Older, Male Preventive care refers to lifestyle choices and visits with your health care provider that can promote health and wellness. What does preventive care include? A yearly physical exam. This is also called an annual well check. Dental exams once or twice a year. Routine eye exams. Ask your health care provider how often you should have your eyes checked. Personal lifestyle choices, including: Daily care of your teeth and gums. Regular physical activity. Eating a healthy diet. Avoiding tobacco and drug use. Limiting alcohol use. Practicing safe sex. Taking low doses of aspirin every day. Taking vitamin and mineral supplements as recommended by your health care provider. What happens during an annual well check? The services and screenings done by your health care provider during your annual well check will depend on your age, overall health, lifestyle risk factors, and family history of disease. Counseling  Your health care provider may ask you questions about your: Alcohol use. Tobacco use. Drug  use. Emotional well-being. Home and relationship well-being. Sexual activity. Eating habits. History of falls. Memory and ability to understand (cognition). Work and work Statistician. Screening  You may have the following tests or measurements: Height, weight, and BMI. Blood pressure. Lipid and cholesterol levels. These may be checked every 5 years, or more frequently if you are over 69 years old. Skin check. Lung cancer screening. You may have this screening every year starting at age 69 if you have a 30-pack-year history of smoking and currently smoke or have quit within the past 15 years. Fecal occult blood test (FOBT) of the stool. You may have this test every year starting at age 69. Flexible sigmoidoscopy or colonoscopy. You may have a sigmoidoscopy every 5 years or a colonoscopy every 10 years starting at age 69. Prostate cancer screening. Recommendations will vary depending on your family history and other risks. Hepatitis C blood test. Hepatitis B blood test. Sexually transmitted disease (STD) testing. Diabetes screening. This is done by checking your blood sugar (glucose) after you have not eaten for a while (fasting). You may have this done every 1-3 years. Abdominal aortic aneurysm (AAA) screening. You may need this if you are a current or former smoker. Osteoporosis. You may be screened starting at age 69 if you are at high risk. Talk with your health care provider about your test results, treatment options, and if necessary, the need for more tests. Vaccines  Your health care provider may recommend certain vaccines, such as: Influenza vaccine. This is recommended every year. Tetanus, diphtheria, and acellular pertussis (Tdap, Td) vaccine. You may need a Td booster every 10 years. Zoster vaccine. You may need this after age 61. Pneumococcal 13-valent conjugate (PCV13) vaccine. One dose is recommended after age 75. Pneumococcal polysaccharide (PPSV23) vaccine.  One dose is  recommended after age 69. Talk to your health care provider about which screenings and vaccines you need and how often you need them. This information is not intended to replace advice given to you by your health care provider. Make sure you discuss any questions you have with your health care provider. Document Released: 11/07/2015 Document Revised: 06/30/2016 Document Reviewed: 08/12/2015 Elsevier Interactive Patient Education  2017 [employer] Prevention in the Home Falls can cause injuries. They can happen to people of all ages. There are many things you can do to make your home safe and to help prevent falls. What can I do on the outside of my home? Regularly fix the edges of walkways and driveways and fix any cracks. Remove anything that might make you trip as you walk through a door, such as a raised step or threshold. Trim any bushes or trees on the path to your home. Use bright outdoor lighting. Clear any walking paths of anything that might make someone trip, such as rocks or tools. Regularly check to see if handrails are loose or broken. Make sure that both sides of any steps have handrails. Any raised decks and porches should have guardrails on the edges. Have any leaves, snow, or ice cleared regularly. Use sand or salt on walking paths during winter. Clean up any spills in your garage right away. This includes oil or grease spills. What can I do in the bathroom? Use night lights. Install grab bars by the toilet and in the tub and shower. Do not use towel bars as grab bars. Use non-skid mats or decals in the tub or shower. If you need to sit down in the shower, use a plastic, non-slip stool. Keep the floor dry. Clean up any water that spills on the floor as soon as it happens. Remove soap buildup in the tub or shower regularly. Attach bath mats securely with double-sided non-slip rug tape. Do not have throw rugs and other things on the floor that can make you  trip. What can I do in the bedroom? Use night lights. Make sure that you have a light by your bed that is easy to reach. Do not use any sheets or blankets that are too big for your bed. They should not hang down onto the floor. Have a firm chair that has side arms. You can use this for support while you get dressed. Do not have throw rugs and other things on the floor that can make you trip. What can I do in the kitchen? Clean up any spills right away. Avoid walking on wet floors. Keep items that you use a lot in easy-to-reach places. If you need to reach something above you, use a strong step stool that has a grab bar. Keep electrical cords out of the way. Do not use floor polish or wax that makes floors slippery. If you must use wax, use non-skid floor wax. Do not have throw rugs and other things on the floor that can make you trip. What can I do with my stairs? Do not leave any items on the stairs. Make sure that there are handrails on both sides of the stairs and use them. Fix handrails that are broken or loose. Make sure that handrails are as long as the stairways. Check any carpeting to make sure that it is firmly attached to the stairs. Fix any carpet that is loose or worn. Avoid having throw rugs at the top or bottom of  the stairs. If you do have throw rugs, attach them to the floor with carpet tape. Make sure that you have a light switch at the top of the stairs and the bottom of the stairs. If you do not have them, ask someone to add them for you. What else can I do to help prevent falls? Wear shoes that: Do not have high heels. Have rubber bottoms. Are comfortable and fit you well. Are closed at the toe. Do not wear sandals. If you use a stepladder: Make sure that it is fully opened. Do not climb a closed stepladder. Make sure that both sides of the stepladder are locked into place. Ask someone to hold it for you, if possible. Clearly mark and make sure that you can  see: Any grab bars or handrails. First and last steps. Where the edge of each step is. Use tools that help you move around (mobility aids) if they are needed. These include: Canes. Walkers. Scooters. Crutches. Turn on the lights when you go into a dark area. Replace any light bulbs as soon as they burn out. Set up your furniture so you have a clear path. Avoid moving your furniture around. If any of your floors are uneven, fix them. If there are any pets around you, be aware of where they are. Review your medicines with your doctor. Some medicines can make you feel dizzy. This can increase your chance of falling. Ask your doctor what other things that you can do to help prevent falls. This information is not intended to replace advice given to you by your health care provider. Make sure you discuss any questions you have with your health care provider. Document Released: 08/07/2009 Document Revised: 03/18/2016 Document Reviewed: 11/15/2014 Elsevier Interactive Patient Education  2017 [employer].

## 2021-06-12 ENCOUNTER — Other Ambulatory Visit: Payer: Self-pay | Admitting: Family Medicine

## 2021-06-12 DIAGNOSIS — I10 Essential (primary) hypertension: Secondary | ICD-10-CM

## 2021-06-15 ENCOUNTER — Encounter: Payer: Self-pay | Admitting: Internal Medicine

## 2021-06-15 MED ORDER — GLIPIZIDE ER 2.5 MG PO TB24: 2.5000 mg | ORAL_TABLET | Freq: Every day | ORAL | 2 refills | Status: DC

## 2021-06-19 DIAGNOSIS — I10 Essential (primary) hypertension: Secondary | ICD-10-CM | POA: Diagnosis not present

## 2021-06-19 DIAGNOSIS — E119 Type 2 diabetes mellitus without complications: Secondary | ICD-10-CM | POA: Diagnosis not present

## 2021-06-24 DIAGNOSIS — E119 Type 2 diabetes mellitus without complications: Secondary | ICD-10-CM | POA: Diagnosis not present

## 2021-06-24 DIAGNOSIS — I10 Essential (primary) hypertension: Secondary | ICD-10-CM | POA: Diagnosis not present

## 2021-07-14 ENCOUNTER — Ambulatory Visit: Payer: Medicare HMO | Admitting: Internal Medicine

## 2021-07-17 DIAGNOSIS — I10 Essential (primary) hypertension: Secondary | ICD-10-CM | POA: Diagnosis not present

## 2021-07-17 DIAGNOSIS — E119 Type 2 diabetes mellitus without complications: Secondary | ICD-10-CM | POA: Diagnosis not present

## 2021-07-20 DIAGNOSIS — M25561 Pain in right knee: Secondary | ICD-10-CM | POA: Diagnosis not present

## 2021-07-20 DIAGNOSIS — M25562 Pain in left knee: Secondary | ICD-10-CM | POA: Diagnosis not present

## 2021-07-20 DIAGNOSIS — M170 Bilateral primary osteoarthritis of knee: Secondary | ICD-10-CM | POA: Diagnosis not present

## 2021-07-21 DIAGNOSIS — M170 Bilateral primary osteoarthritis of knee: Secondary | ICD-10-CM | POA: Diagnosis not present

## 2021-07-24 DIAGNOSIS — I10 Essential (primary) hypertension: Secondary | ICD-10-CM | POA: Diagnosis not present

## 2021-07-24 DIAGNOSIS — E119 Type 2 diabetes mellitus without complications: Secondary | ICD-10-CM | POA: Diagnosis not present

## 2021-07-25 ENCOUNTER — Encounter: Payer: Self-pay | Admitting: Family Medicine

## 2021-08-11 DIAGNOSIS — I739 Peripheral vascular disease, unspecified: Secondary | ICD-10-CM | POA: Diagnosis not present

## 2021-08-14 DIAGNOSIS — H02834 Dermatochalasis of left upper eyelid: Secondary | ICD-10-CM | POA: Diagnosis not present

## 2021-08-14 DIAGNOSIS — H02831 Dermatochalasis of right upper eyelid: Secondary | ICD-10-CM | POA: Diagnosis not present

## 2021-08-14 DIAGNOSIS — Z7984 Long term (current) use of oral hypoglycemic drugs: Secondary | ICD-10-CM | POA: Diagnosis not present

## 2021-08-14 DIAGNOSIS — H5203 Hypermetropia, bilateral: Secondary | ICD-10-CM | POA: Diagnosis not present

## 2021-08-14 DIAGNOSIS — H25013 Cortical age-related cataract, bilateral: Secondary | ICD-10-CM | POA: Diagnosis not present

## 2021-08-14 DIAGNOSIS — H524 Presbyopia: Secondary | ICD-10-CM | POA: Diagnosis not present

## 2021-08-14 DIAGNOSIS — H52203 Unspecified astigmatism, bilateral: Secondary | ICD-10-CM | POA: Diagnosis not present

## 2021-08-14 DIAGNOSIS — H57813 Brow ptosis, bilateral: Secondary | ICD-10-CM | POA: Diagnosis not present

## 2021-08-14 DIAGNOSIS — E119 Type 2 diabetes mellitus without complications: Secondary | ICD-10-CM | POA: Diagnosis not present

## 2021-08-14 DIAGNOSIS — H2513 Age-related nuclear cataract, bilateral: Secondary | ICD-10-CM | POA: Diagnosis not present

## 2021-08-16 ENCOUNTER — Other Ambulatory Visit: Payer: Self-pay | Admitting: Family Medicine

## 2021-08-16 ENCOUNTER — Other Ambulatory Visit: Payer: Self-pay | Admitting: Cardiology

## 2021-08-21 ENCOUNTER — Encounter: Payer: Self-pay | Admitting: Family Medicine

## 2021-08-21 DIAGNOSIS — E119 Type 2 diabetes mellitus without complications: Secondary | ICD-10-CM | POA: Diagnosis not present

## 2021-08-21 DIAGNOSIS — I10 Essential (primary) hypertension: Secondary | ICD-10-CM | POA: Diagnosis not present

## 2021-08-23 MED ORDER — FREESTYLE LIBRE 2 SENSOR MISC: 1.0000 | Freq: Every day | 6 refills | Status: DC

## 2021-08-23 MED ORDER — FREESTYLE LIBRE 2 READER DEVI: 1.0000 | Freq: Every day | 6 refills | Status: DC

## 2021-08-23 NOTE — Addendum Note (Signed)
Addended by: Lamar Blinks C on: 08/23/2021 03:36 PM   Modules accepted: Orders

## 2021-08-24 DIAGNOSIS — E119 Type 2 diabetes mellitus without complications: Secondary | ICD-10-CM | POA: Diagnosis not present

## 2021-08-24 DIAGNOSIS — I10 Essential (primary) hypertension: Secondary | ICD-10-CM | POA: Diagnosis not present

## 2021-08-29 ENCOUNTER — Encounter: Payer: Self-pay | Admitting: Family Medicine

## 2021-09-04 ENCOUNTER — Other Ambulatory Visit: Payer: Self-pay | Admitting: Family Medicine

## 2021-09-04 DIAGNOSIS — I10 Essential (primary) hypertension: Secondary | ICD-10-CM

## 2021-09-12 IMAGING — US US ABDOMINAL AORTA SCREENING AAA
1 series · 14 of 15 positions shown · non-contrast
Comparison: None.

CLINICAL DATA: Male between 65-75 years of age with a smoking
history.

EXAM:
US ABDOMINAL AORTA MEDICARE SCREENING
TECHNIQUE: Ultrasound examination of the abdominal aorta was performed as a
screening evaluation for abdominal aortic aneurysm.

[Series 1: us abdominal aorta screening aaa · 14 of 15 slices shown]
[im 1/15]
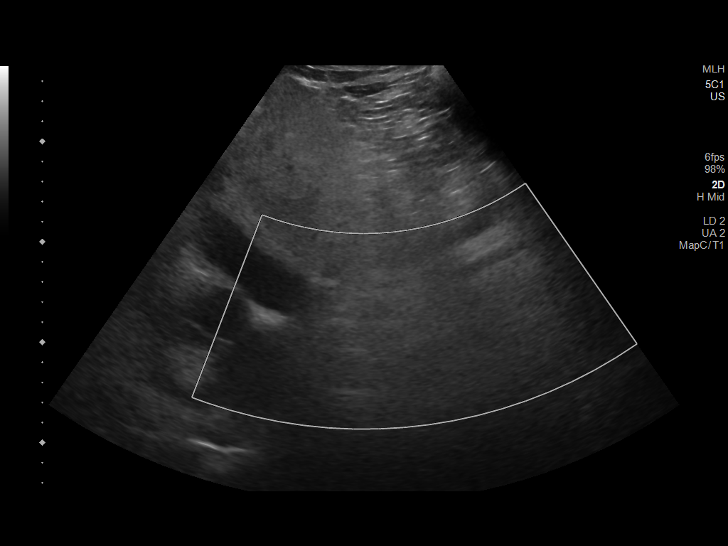
[im 2/15]
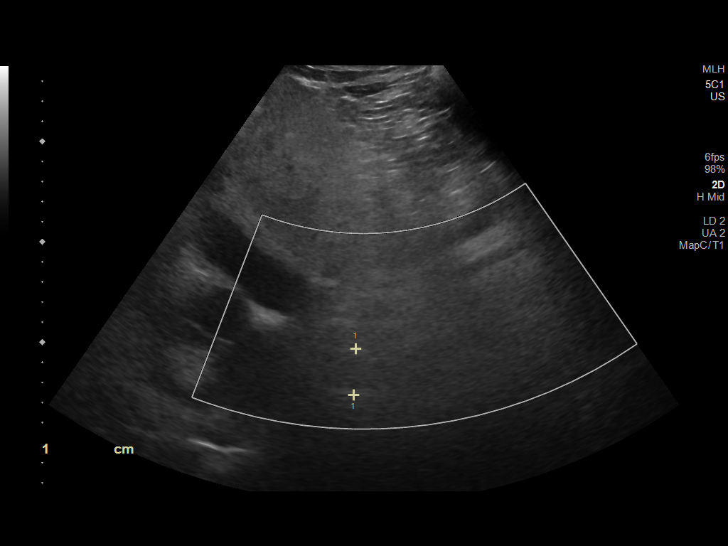
[im 3/15]
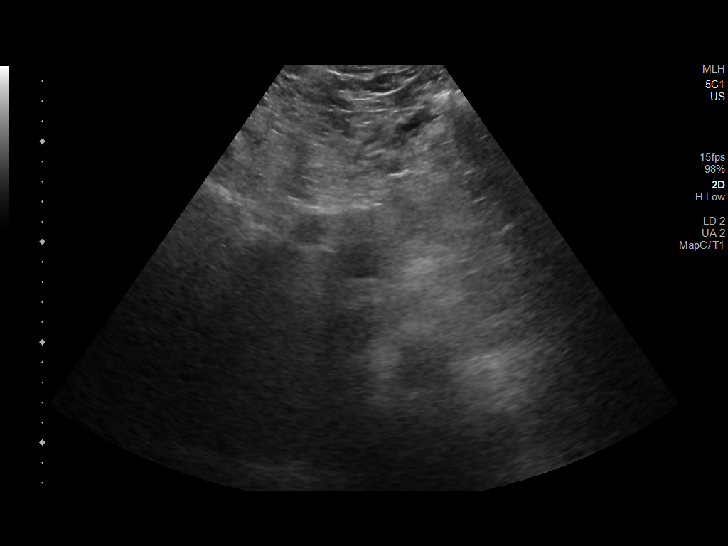
[im 4/15]
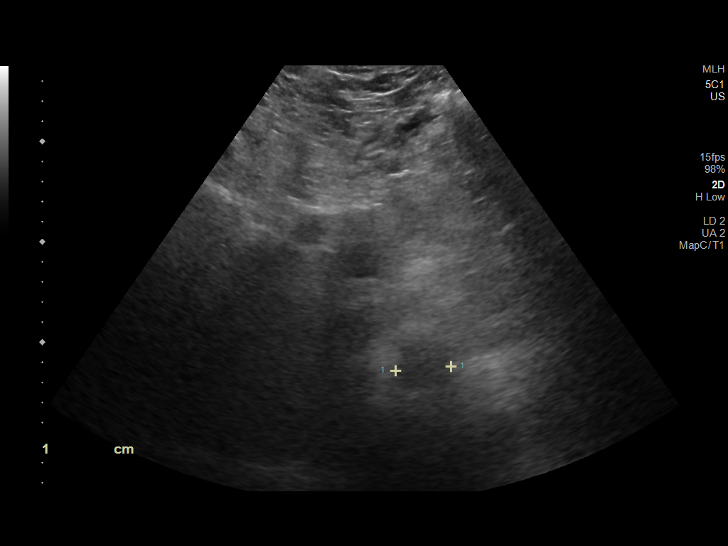
[im 5/15]
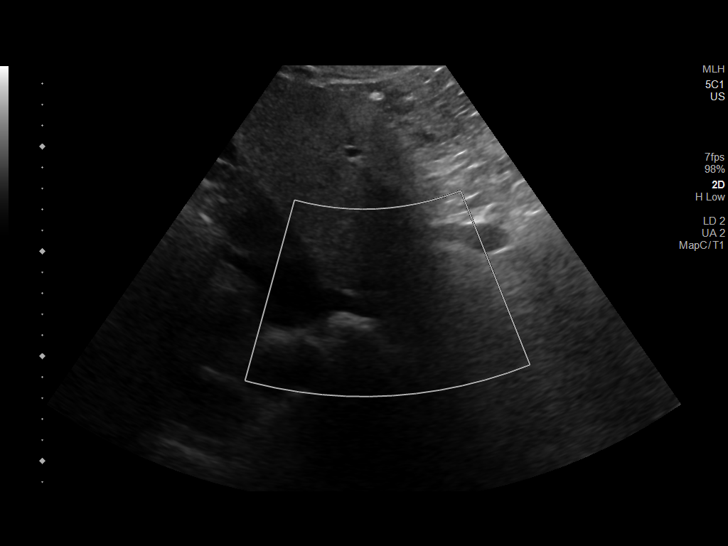
[im 6/15]
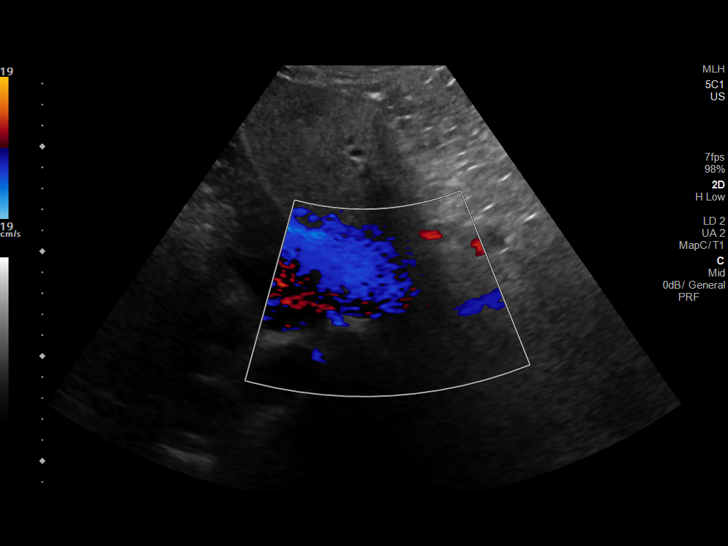
[im 7/15]
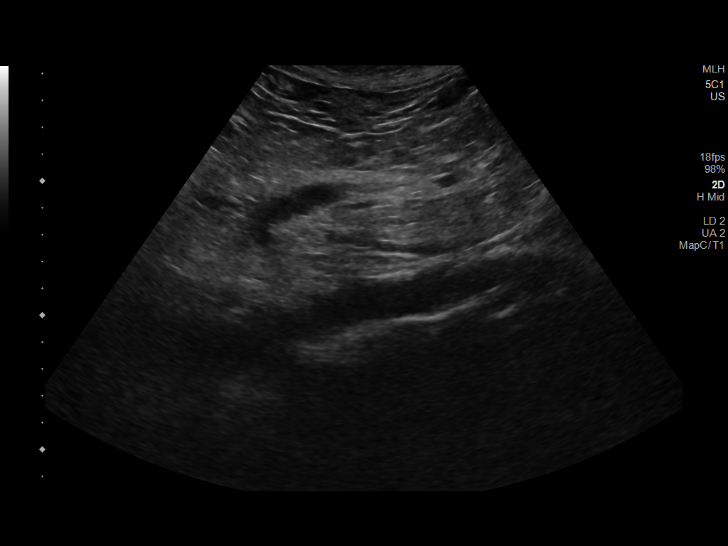
[im 9/15]
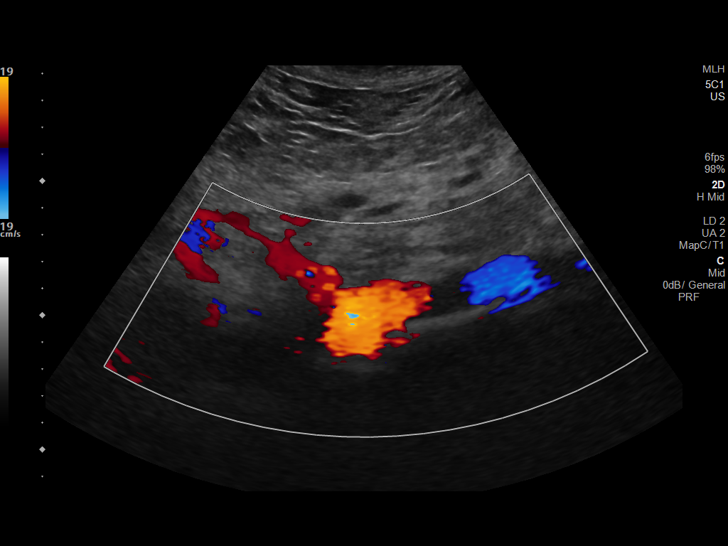
[im 10/15]
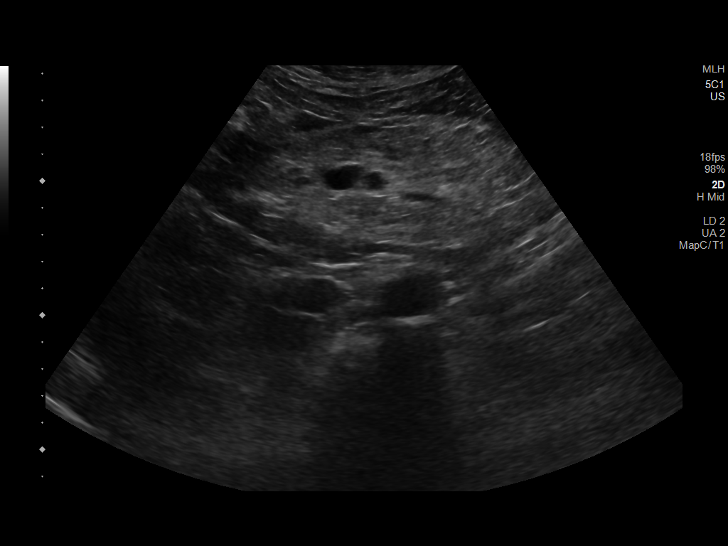
[im 11/15]
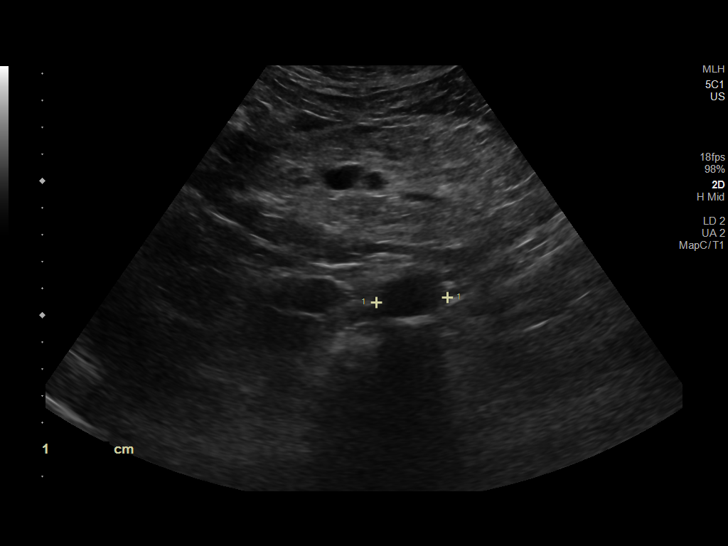
[im 12/15]
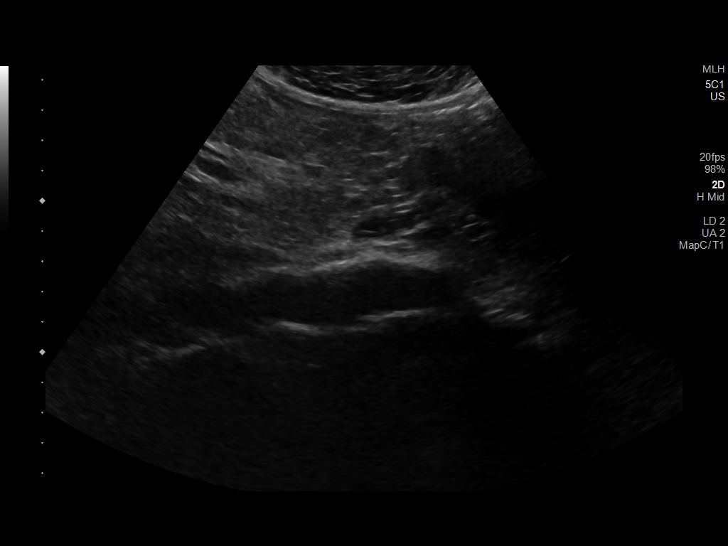
[im 13/15]
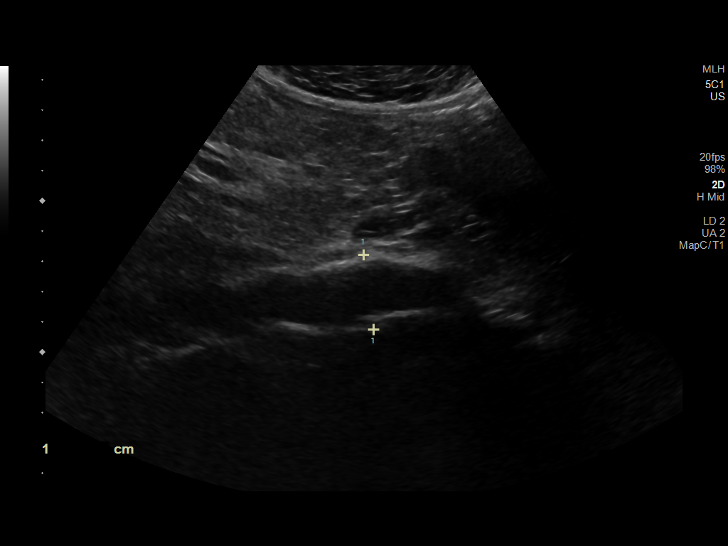
[im 14/15]
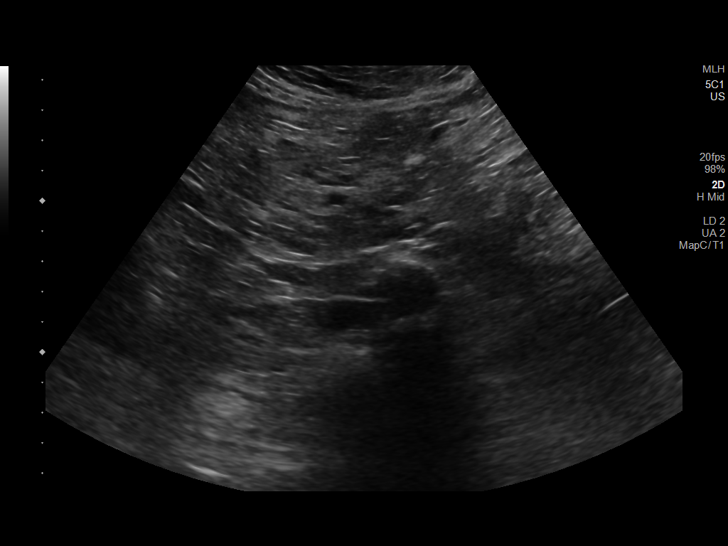
[im 15/15]
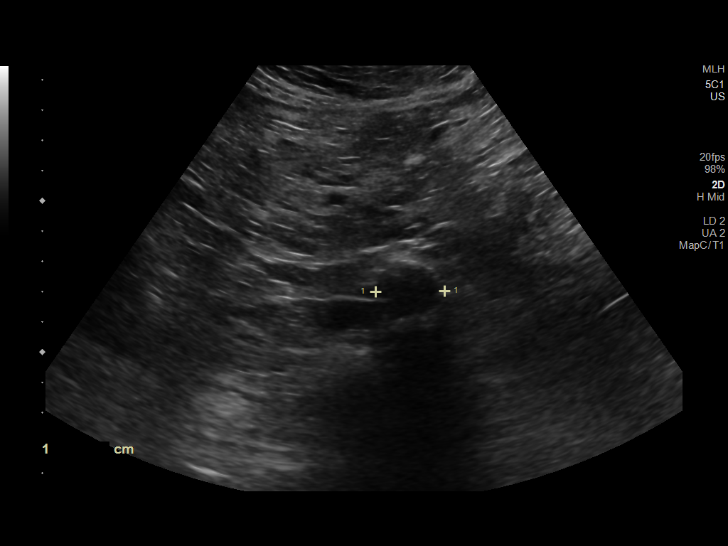

[14 of 15 positions shown; findings below may reference images not displayed]

FINDINGS: Abdominal aortic measurements as follows:

Proximal:  2.8 cm

Mid:  2.7 cm

Distal:  2.5 cm
IMPRESSION: No evidence of abdominal aortic aneurysm.

## 2021-09-23 DIAGNOSIS — E119 Type 2 diabetes mellitus without complications: Secondary | ICD-10-CM | POA: Diagnosis not present

## 2021-09-23 DIAGNOSIS — I10 Essential (primary) hypertension: Secondary | ICD-10-CM | POA: Diagnosis not present

## 2021-10-24 DIAGNOSIS — E119 Type 2 diabetes mellitus without complications: Secondary | ICD-10-CM | POA: Diagnosis not present

## 2021-10-24 DIAGNOSIS — I10 Essential (primary) hypertension: Secondary | ICD-10-CM | POA: Diagnosis not present

## 2021-10-24 DIAGNOSIS — E785 Hyperlipidemia, unspecified: Secondary | ICD-10-CM | POA: Diagnosis not present

## 2021-10-25 NOTE — Progress Notes (Deleted)
HPI: FU CAD. Patient had PCI in [place] previously. No records available. Patient is followed by vascular surgery for peripheral vascular disease. Angiogram March 2018 showed no renal artery stenosis. 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 There was reconstitution of the distal right common iliac artery; it was felt medical therapy indicated. Nuclear study April 2018 showed ejection fraction 54% with no ischemia or infarction. ABIs 1/20 showed moderate disease on the right and normal on the left. Patient apparently had attempt at iliac recanalization in [facility] which was unsuccessful March 2020.  Abdominal ultrasound May 2021 showed no aneurysm.  Since last seen,   Current Outpatient Medications  Medication Sig Dispense Refill   amLODipine (NORVASC) 10 MG tablet TAKE 1 TABLET BY MOUTH EVERY DAY 90 tablet 3   aspirin 81 MG tablet Take 81 mg by mouth daily.     Blood Glucose Monitoring Suppl (FREESTYLE LITE) DEVI CHECK BLOOD SUGAR ONCE DAILY  0   Blood Glucose Monitoring Suppl (FREESTYLE LITE) DEVI CHECK BLOOD SUGAR ONCE DAILY 1 each 0   busPIRone (BUSPAR) 15 MG tablet TAKE ONE TABLET BY MOUTH TWICE A DAY 180 tablet 1   carvedilol (COREG) 25 MG tablet TAKE 1 TABLET BY MOUTH TWICE A DAY 180 tablet 3   Cholecalciferol (VITAMIN D3) 2000 units TABS Take 2,000 Units by mouth daily.     cloNIDine (CATAPRES) 0.2 MG tablet Take 1 tablet (0.2 mg total) by mouth 2 (two) times daily. 180 tablet 3   Coenzyme Q10 300 MG CAPS Take 300 mg by mouth daily.      Continuous Blood Gluc Receiver (FREESTYLE LIBRE 2 READER) DEVI 1 Device by Does not apply route daily. 2 each 6   Continuous Blood Gluc Sensor (FREESTYLE LIBRE 2 SENSOR) MISC 1 Device by Does not apply route daily. 2 each 6   Cyanocobalamin (VITAMIN B-12 PO) Take 2,500 mg by mouth daily.     diphenoxylate-atropine (LOMOTIL) 2.5-0.025 MG tablet Take 1 tablet by mouth every 6 (six)  hours as needed for diarrhea or loose stools. 60 tablet 1   glucose blood (FREESTYLE LITE) test strip Check blood sugar 1-3x daily 100 each 12   Lancets (FREESTYLE) lancets Check blood sugar once daily 100 each 12   lisinopril (ZESTRIL) 40 MG tablet TAKE 1.5 TABLETS BY MOUTH DAILY. 135 tablet 1   meloxicam (MOBIC) 15 MG tablet Take 1 tablet (15 mg total) by mouth daily as needed for pain. 90 tablet 1   metFORMIN (GLUCOPHAGE) 1000 MG tablet Take 1 tablet (1,000 mg total) by mouth 2 (two) times daily with a meal. 180 tablet 3   MULTIPLE VITAMIN PO Take 1 tablet by mouth daily.      niacin 500 MG tablet Take 500 mg by mouth every morning.      Omega-3 Fatty Acids (EQL OMEGA 3 FISH OIL) 1000 MG CAPS Take 1 capsule by mouth 2 (two) times daily.     pantoprazole (PROTONIX) 40 MG tablet Take 1 tablet (40 mg total) by mouth daily. 90 tablet 3   prednisoLONE acetate (PRED FORTE) 1 % ophthalmic suspension      rosuvastatin (CRESTOR) 40 MG tablet TAKE 1 TABLET BY MOUTH EVERY DAY 90 tablet 1   spironolactone (ALDACTONE) 25 MG tablet TAKE 1 TABLET BY MOUTH EVERY DAY 90 tablet 2   tamsulosin (FLOMAX) 0.4 MG CAPS capsule TAKE 1 CAPSULE (0.4 MG TOTAL) BY MOUTH DAILY.  MAY INCREASE TO 2 CAPSULES AFTER 2 WEEKS IF NEEDED FOR PROSTATE SYMPTOMS 90 capsule 1   No current facility-administered medications for this visit.     Past Medical History:  Diagnosis Date   CAD (coronary artery disease)    Cancer ([place])    basal cell excision upper right ear 7 yrs ago   Complication of anesthesia    bad pain with anesthesia induction with colonscopy 6 yrs ago   Diabetes mellitus without complication (HCC)    type 2   GERD (gastroesophageal reflux disease)    Hyperlipidemia    Hypertension    Peripheral vascular disease ([place])    partial clogged artery right leg   Sleep apnea    yrs ago sleep study no cpap used    Past Surgical History:  Procedure Laterality Date   ABDOMINAL AORTOGRAM W/LOWER EXTREMITY N/A  12/27/2016   Procedure: Abdominal Aortogram w/Lower Extremity;  Surgeon: Angelia Mould, MD;  Location: [facility];  Service: Cardiovascular;  Laterality: N/A;   CARDIAC SURGERY     CORONARY ANGIOPLASTY WITH STENT PLACEMENT  11/2004   in [place] Right    NASAL SINUS SURGERY  1976   TONSILLECTOMY      Social History   Socioeconomic History   Marital status: Married    Spouse name: Not on file   Number of children: 2   Years of education: Not on file   Highest education level: Not on file  Occupational History   Not on file  Tobacco Use   Smoking status: Former    Packs/day: 1.50    Years: 26.00    Pack years: 39.00    Types: Cigarettes    Quit date: 24    Years since quitting: 29.0   Smokeless tobacco: Former  Scientific laboratory technician Use: Never used  Substance and Sexual Activity   Alcohol use: Yes    Alcohol/week: 0.0 standard drinks    Comment: 3 ounces on monday   Drug use: No   Sexual activity: Yes  Other Topics Concern   Not on file  Social History Narrative   Not on file   Social Determinants of Health   Financial Resource Strain: Low Risk    Difficulty of Paying Living Expenses: Not hard at all  Food Insecurity: No Food Insecurity   Worried About Charity fundraiser in the Last Year: Never true   [place] in the Last Year: Never true  Transportation Needs: No Transportation Needs   Lack of Transportation (Medical): No   Lack of Transportation (Non-Medical): No  Physical Activity: Inactive   Days of Exercise per Week: 0 days   Minutes of Exercise per Session: 0 min  Stress: No Stress Concern Present   Feeling of Stress : Only a little  Social Connections: Moderately Isolated   Frequency of Communication with Friends and Family: More than three times a week   Frequency of Social Gatherings with Friends and Family: More than three times a week   Attends Religious Services: Never   Marine scientist or  Organizations: No   Attends Music therapist: Never   Marital Status: Married  Human resources officer Violence: Not At Risk   Fear of Current or Ex-Partner: No   Emotionally Abused: No   Physically Abused: No   Sexually Abused: No    Family History  Problem Relation Age of Onset   Alcohol abuse Brother  Cancer Brother    Diabetes Brother    Hyperlipidemia Brother    Hypertension Brother    Diabetes Mother    Hypertension Mother    Hyperlipidemia Mother    Heart attack Mother    Hyperlipidemia Father    Hypertension Father     ROS: no fevers or chills, productive cough, hemoptysis, dysphasia, odynophagia, melena, hematochezia, dysuria, hematuria, rash, seizure activity, orthopnea, PND, pedal edema, claudication. Remaining systems are negative.  Physical Exam: Well-developed well-nourished in no acute distress.  Skin is warm and dry.  HEENT is normal.  Neck is supple.  Chest is clear to auscultation with normal expansion.  Cardiovascular exam is regular rate and rhythm.  Abdominal exam nontender or distended. No masses palpated. Extremities show no edema. neuro grossly intact  ECG- personally reviewed  A/P  1 coronary artery disease-patient doing well from a symptomatic standpoint with no chest pain.  Continue aspirin and statin.  2 hypertension-blood pressure controlled.  Continue present medical regimen.  3 hyperlipidemia-continue statin.  4 peripheral vascular disease-followed by vascular surgery.  Previous attempt at iliac intervention unsuccessful.  5 morbid obesity-we again discussed the importance of weight loss.  Kirk Ruths, MD

## 2021-10-26 ENCOUNTER — Encounter: Payer: Self-pay | Admitting: Family Medicine

## 2021-10-31 ENCOUNTER — Telehealth: Payer: Medicare HMO | Admitting: Emergency Medicine

## 2021-10-31 ENCOUNTER — Encounter: Payer: Self-pay | Admitting: Family Medicine

## 2021-10-31 DIAGNOSIS — J208 Acute bronchitis due to other specified organisms: Secondary | ICD-10-CM

## 2021-10-31 DIAGNOSIS — B9689 Other specified bacterial agents as the cause of diseases classified elsewhere: Secondary | ICD-10-CM

## 2021-10-31 MED ORDER — PREDNISONE 10 MG (21) PO TBPK: ORAL_TABLET | Freq: Every day | ORAL | 0 refills | Status: DC

## 2021-10-31 MED ORDER — AZITHROMYCIN 250 MG PO TABS: ORAL_TABLET | ORAL | 0 refills | Status: AC

## 2021-10-31 NOTE — Patient Instructions (Signed)
Jolyn Lent, thank you for joining Lestine Box, PA-C for today's virtual visit.  While this provider is not your primary care provider (PCP), if your PCP is located in our provider database this encounter information will be shared with them immediately following your visit.  Consent: (Patient) Jolyn Lent provided verbal consent for this virtual visit at the beginning of the encounter.  Current Medications:  Current Outpatient Medications:    azithromycin (ZITHROMAX) 250 MG tablet, Take 2 tablets on day 1, then 1 tablet daily on days 2 through 5, Disp: 6 tablet, Rfl: 0   predniSONE (STERAPRED UNI-PAK 21 TAB) 10 MG (21) TBPK tablet, Take by mouth daily. Take 6 tabs by mouth daily  for 2 days, then 5 tabs for 2 days, then 4 tabs for 2 days, then 3 tabs for 2 days, 2 tabs for 2 days, then 1 tab by mouth daily for 2 days, Disp: 42 tablet, Rfl: 0   amLODipine (NORVASC) 10 MG tablet, TAKE 1 TABLET BY MOUTH EVERY DAY, Disp: 90 tablet, Rfl: 3   aspirin 81 MG tablet, Take 81 mg by mouth daily., Disp: , Rfl:    Blood Glucose Monitoring Suppl (FREESTYLE LITE) DEVI, CHECK BLOOD SUGAR ONCE DAILY, Disp: , Rfl: 0   Blood Glucose Monitoring Suppl (FREESTYLE LITE) DEVI, CHECK BLOOD SUGAR ONCE DAILY, Disp: 1 each, Rfl: 0   busPIRone (BUSPAR) 15 MG tablet, TAKE ONE TABLET BY MOUTH TWICE A DAY, Disp: 180 tablet, Rfl: 1   carvedilol (COREG) 25 MG tablet, TAKE 1 TABLET BY MOUTH TWICE A DAY, Disp: 180 tablet, Rfl: 3   Cholecalciferol (VITAMIN D3) 2000 units TABS, Take 2,000 Units by mouth daily., Disp: , Rfl:    cloNIDine (CATAPRES) 0.2 MG tablet, Take 1 tablet (0.2 mg total) by mouth 2 (two) times daily., Disp: 180 tablet, Rfl: 3   Coenzyme Q10 300 MG CAPS, Take 300 mg by mouth daily. , Disp: , Rfl:    Continuous Blood Gluc Receiver (FREESTYLE LIBRE 2 READER) DEVI, 1 Device by Does not apply route daily., Disp: 2 each, Rfl: 6   Continuous Blood Gluc Sensor (FREESTYLE LIBRE 2 SENSOR) MISC, 1  Device by Does not apply route daily., Disp: 2 each, Rfl: 6   Cyanocobalamin (VITAMIN B-12 PO), Take 2,500 mg by mouth daily., Disp: , Rfl:    diphenoxylate-atropine (LOMOTIL) 2.5-0.025 MG tablet, Take 1 tablet by mouth every 6 (six) hours as needed for diarrhea or loose stools., Disp: 60 tablet, Rfl: 1   glucose blood (FREESTYLE LITE) test strip, Check blood sugar 1-3x daily, Disp: 100 each, Rfl: 12   Lancets (FREESTYLE) lancets, Check blood sugar once daily, Disp: 100 each, Rfl: 12   lisinopril (ZESTRIL) 40 MG tablet, TAKE 1.5 TABLETS BY MOUTH DAILY., Disp: 135 tablet, Rfl: 1   meloxicam (MOBIC) 15 MG tablet, Take 1 tablet (15 mg total) by mouth daily as needed for pain., Disp: 90 tablet, Rfl: 1   metFORMIN (GLUCOPHAGE) 1000 MG tablet, Take 1 tablet (1,000 mg total) by mouth 2 (two) times daily with a meal., Disp: 180 tablet, Rfl: 3   MULTIPLE VITAMIN PO, Take 1 tablet by mouth daily. , Disp: , Rfl:    niacin 500 MG tablet, Take 500 mg by mouth every morning. , Disp: , Rfl:    Omega-3 Fatty Acids (EQL OMEGA 3 FISH OIL) 1000 MG CAPS, Take 1 capsule by mouth 2 (two) times daily., Disp: , Rfl:    pantoprazole (PROTONIX) 40 MG tablet, Take 1  tablet (40 mg total) by mouth daily., Disp: 90 tablet, Rfl: 3   prednisoLONE acetate (PRED FORTE) 1 % ophthalmic suspension, , Disp: , Rfl:    rosuvastatin (CRESTOR) 40 MG tablet, TAKE 1 TABLET BY MOUTH EVERY DAY, Disp: 90 tablet, Rfl: 1   spironolactone (ALDACTONE) 25 MG tablet, TAKE 1 TABLET BY MOUTH EVERY DAY, Disp: 90 tablet, Rfl: 2   tamsulosin (FLOMAX) 0.4 MG CAPS capsule, TAKE 1 CAPSULE (0.4 MG TOTAL) BY MOUTH DAILY. MAY INCREASE TO 2 CAPSULES AFTER 2 WEEKS IF NEEDED FOR PROSTATE SYMPTOMS, Disp: 90 capsule, Rfl: 1   Medications ordered in this encounter:  Meds ordered this encounter  Medications   azithromycin (ZITHROMAX) 250 MG tablet    Sig: Take 2 tablets on day 1, then 1 tablet daily on days 2 through 5    Dispense:  6 tablet    Refill:  0     Order Specific Question:   Supervising Provider    Answer:   Sabra Heck, BRIAN [[id number]]   predniSONE (STERAPRED UNI-PAK 21 TAB) 10 MG (21) TBPK tablet    Sig: Take by mouth daily. Take 6 tabs by mouth daily  for 2 days, then 5 tabs for 2 days, then 4 tabs for 2 days, then 3 tabs for 2 days, 2 tabs for 2 days, then 1 tab by mouth daily for 2 days    Dispense:  42 tablet    Refill:  0    Order Specific Question:   Supervising Provider    Answer:   Noemi Chapel [[id number]]     *If you need refills on other medications prior to your next appointment, please contact your pharmacy*  Follow-Up: Call back or seek an in-person evaluation if the symptoms worsen or if the condition fails to improve as anticipated.  Other Instructions Get plenty of rest and push fluids Prescribed azithromycin and prednisone.  Take as directed and to completion Use OTC zyrtec for nasal congestion, runny nose, and/or sore throat Use OTC flonase for nasal congestion and runny nose Use medications daily for symptom relief Use OTC medications like ibuprofen or tylenol as needed fever or pain Follow up in person with urgent care or go to the ED if you have any new or worsening symptoms such as fever, worsening cough, shortness of breath, chest tightness, chest pain, turning blue, changes in mental status, etc...    If you have been instructed to have an in-person evaluation today at a local Urgent Care facility, please use the link below. It will take you to a list of all of our available [facility], including address, phone number and hours of operation. Please do not delay care.  [facility]  If you or a family member do not have a primary care provider, use the link below to schedule a visit and establish care. When you choose a [facility] primary care physician or advanced practice provider, you gain a long-term partner in health. Find a Primary Care Provider  Learn more about [facility]'s  in-office and virtual care options: [facility]

## 2021-10-31 NOTE — Progress Notes (Signed)
Virtual Visit Consent   Abdulrahman Bracey [facility], you are scheduled for a virtual visit with a [facility] provider today.     Just as with appointments in the office, your consent must be obtained to participate.  Your consent will be active for this visit and any virtual visit you may have with one of our providers in the next 365 days.     If you have a MyChart account, a copy of this consent can be sent to you electronically.  All virtual visits are billed to your insurance company just like a traditional visit in the office.    As this is a virtual visit, video technology does not allow for your provider to perform a traditional examination.  This may limit your provider's ability to fully assess your condition.  If your provider identifies any concerns that need to be evaluated in person or the need to arrange testing (such as labs, EKG, etc.), we will make arrangements to do so.     Although advances in technology are sophisticated, we cannot ensure that it will always work on either your end or our end.  If the connection with a video visit is poor, the visit may have to be switched to a telephone visit.  With either a video or telephone visit, we are not always able to ensure that we have a secure connection.     I need to obtain your verbal consent now.   Are you willing to proceed with your visit today? Yes   Daden Mahany has provided verbal consent on 10/31/2021 for a virtual visit (video or telephone).   David Singh, [place]   Date: 10/31/2021 6:14 PM   Virtual Visit via Video Note   I, David Singh, connected with  Farris Geiman  ([id number], August 31, 1951) on 10/31/21 at  6:00 PM EST by a video-enabled telemedicine application and verified that I am speaking with the correct person using two identifiers.  Location: Patient: Virtual Visit Location Patient: Home Provider: Virtual Visit Location Provider: Home Office   I discussed the limitations of evaluation and  management by telemedicine and the availability of in person appointments. The patient expressed understanding and agreed to proceed.    History of Present Illness:  Starr Engel is a 71 y.o. male who presents with congestion, runny nose, productive cough x 3-4 days.  Denies known sick exposure to COVID, flu or strep.  Admits to possible sick exposure at store.  Has tried OTC medications without relief.  Denies aggravating factors in the past.  Reports previous symptoms in the past.   Reports associated coughing spasms with some wheezes.  Denies fever, chills, fatigue, sinus pain, rhinorrhea, sore throat, SOB, chest pain, nausea, changes in bowel or bladder habits.     ROS: As per HPI.  All other pertinent ROS negative.     Smoked apx 30 years ago.     HPI: HPI  Problems:  Patient Active Problem List   Diagnosis Date Noted   Primary osteoarthritis of right knee 03/03/2020   Chronic gout 03/09/2016   Hypertriglyceridemia 01/16/2015   Type 2 diabetes mellitus ([place]) 01/06/2015   CAD in native artery 01/06/2015   History of coronary artery stent placement 01/06/2015   PAD (peripheral artery disease) ([place]) 01/06/2015   Hyperlipidemia 01/06/2015   Essential hypertension 01/06/2015    Allergies:  Allergies  Allergen Reactions   Dexilant [Dexlansoprazole]     Pressure in stomach and chest   Medications:  Current  Outpatient Medications:    azithromycin (ZITHROMAX) 250 MG tablet, Take 2 tablets on day 1, then 1 tablet daily on days 2 through 5, Disp: 6 tablet, Rfl: 0   predniSONE (STERAPRED UNI-PAK 21 TAB) 10 MG (21) TBPK tablet, Take by mouth daily. Take 6 tabs by mouth daily  for 2 days, then 5 tabs for 2 days, then 4 tabs for 2 days, then 3 tabs for 2 days, 2 tabs for 2 days, then 1 tab by mouth daily for 2 days, Disp: 42 tablet, Rfl: 0   amLODipine (NORVASC) 10 MG tablet, TAKE 1 TABLET BY MOUTH EVERY DAY, Disp: 90 tablet, Rfl: 3   aspirin 81 MG tablet, Take 81 mg by mouth  daily., Disp: , Rfl:    Blood Glucose Monitoring Suppl (FREESTYLE LITE) DEVI, CHECK BLOOD SUGAR ONCE DAILY, Disp: , Rfl: 0   Blood Glucose Monitoring Suppl (FREESTYLE LITE) DEVI, CHECK BLOOD SUGAR ONCE DAILY, Disp: 1 each, Rfl: 0   busPIRone (BUSPAR) 15 MG tablet, TAKE ONE TABLET BY MOUTH TWICE A DAY, Disp: 180 tablet, Rfl: 1   carvedilol (COREG) 25 MG tablet, TAKE 1 TABLET BY MOUTH TWICE A DAY, Disp: 180 tablet, Rfl: 3   Cholecalciferol (VITAMIN D3) 2000 units TABS, Take 2,000 Units by mouth daily., Disp: , Rfl:    cloNIDine (CATAPRES) 0.2 MG tablet, Take 1 tablet (0.2 mg total) by mouth 2 (two) times daily., Disp: 180 tablet, Rfl: 3   Coenzyme Q10 300 MG CAPS, Take 300 mg by mouth daily. , Disp: , Rfl:    Continuous Blood Gluc Receiver (FREESTYLE LIBRE 2 READER) DEVI, 1 Device by Does not apply route daily., Disp: 2 each, Rfl: 6   Continuous Blood Gluc Sensor (FREESTYLE LIBRE 2 SENSOR) MISC, 1 Device by Does not apply route daily., Disp: 2 each, Rfl: 6   Cyanocobalamin (VITAMIN B-12 PO), Take 2,500 mg by mouth daily., Disp: , Rfl:    diphenoxylate-atropine (LOMOTIL) 2.5-0.025 MG tablet, Take 1 tablet by mouth every 6 (six) hours as needed for diarrhea or loose stools., Disp: 60 tablet, Rfl: 1   glucose blood (FREESTYLE LITE) test strip, Check blood sugar 1-3x daily, Disp: 100 each, Rfl: 12   Lancets (FREESTYLE) lancets, Check blood sugar once daily, Disp: 100 each, Rfl: 12   lisinopril (ZESTRIL) 40 MG tablet, TAKE 1.5 TABLETS BY MOUTH DAILY., Disp: 135 tablet, Rfl: 1   meloxicam (MOBIC) 15 MG tablet, Take 1 tablet (15 mg total) by mouth daily as needed for pain., Disp: 90 tablet, Rfl: 1   metFORMIN (GLUCOPHAGE) 1000 MG tablet, Take 1 tablet (1,000 mg total) by mouth 2 (two) times daily with a meal., Disp: 180 tablet, Rfl: 3   MULTIPLE VITAMIN PO, Take 1 tablet by mouth daily. , Disp: , Rfl:    niacin 500 MG tablet, Take 500 mg by mouth every morning. , Disp: , Rfl:    Omega-3 Fatty Acids (EQL  OMEGA 3 FISH OIL) 1000 MG CAPS, Take 1 capsule by mouth 2 (two) times daily., Disp: , Rfl:    pantoprazole (PROTONIX) 40 MG tablet, Take 1 tablet (40 mg total) by mouth daily., Disp: 90 tablet, Rfl: 3   prednisoLONE acetate (PRED FORTE) 1 % ophthalmic suspension, , Disp: , Rfl:    rosuvastatin (CRESTOR) 40 MG tablet, TAKE 1 TABLET BY MOUTH EVERY DAY, Disp: 90 tablet, Rfl: 1   spironolactone (ALDACTONE) 25 MG tablet, TAKE 1 TABLET BY MOUTH EVERY DAY, Disp: 90 tablet, Rfl: 2   tamsulosin (FLOMAX)  0.4 MG CAPS capsule, TAKE 1 CAPSULE (0.4 MG TOTAL) BY MOUTH DAILY. MAY INCREASE TO 2 CAPSULES AFTER 2 WEEKS IF NEEDED FOR PROSTATE SYMPTOMS, Disp: 90 capsule, Rfl: 1  Observations/Objective: Patient is well-developed, well-nourished in no acute distress.  Resting comfortably at home.  Head is normocephalic, atraumatic.  No labored breathing. Speaking in full sentences and tolerating own secretions. Mild cough present Speech is clear and coherent with logical content.  Patient is alert and oriented at baseline.    Assessment and Plan: 1. Acute bacterial bronchitis   Get plenty of rest and push fluids Prescribed azithromycin and prednisone.  Take as directed and to completion Use OTC zyrtec for nasal congestion, runny nose, and/or sore throat Use OTC flonase for nasal congestion and runny nose Use medications daily for symptom relief Use OTC medications like ibuprofen or tylenol as needed fever or pain Follow up in person with urgent care or go to the ED if you have any new or worsening symptoms such as fever, worsening cough, shortness of breath, chest tightness, chest pain, turning blue, changes in mental status, etc...   Follow Up Instructions: I discussed the assessment and treatment plan with the patient. The patient was provided an opportunity to ask questions and all were answered. The patient agreed with the plan and demonstrated an understanding of the instructions.  A copy of instructions  were sent to the patient via MyChart unless otherwise noted below.   The patient was advised to call back or seek an in-person evaluation if the symptoms worsen or if the condition fails to improve as anticipated.  Time:  I spent 5-10 minutes with the patient via telehealth technology discussing the above problems/concerns.    David Box, PA-C

## 2021-11-04 ENCOUNTER — Other Ambulatory Visit: Payer: Self-pay | Admitting: Family Medicine

## 2021-11-04 ENCOUNTER — Ambulatory Visit: Payer: Medicare HMO | Admitting: Cardiology

## 2021-11-04 DIAGNOSIS — N401 Enlarged prostate with lower urinary tract symptoms: Secondary | ICD-10-CM

## 2021-11-24 DIAGNOSIS — I10 Essential (primary) hypertension: Secondary | ICD-10-CM | POA: Diagnosis not present

## 2021-11-24 DIAGNOSIS — E119 Type 2 diabetes mellitus without complications: Secondary | ICD-10-CM | POA: Diagnosis not present

## 2021-11-24 DIAGNOSIS — E785 Hyperlipidemia, unspecified: Secondary | ICD-10-CM | POA: Diagnosis not present

## 2021-11-30 ENCOUNTER — Other Ambulatory Visit: Payer: Self-pay | Admitting: Family Medicine

## 2021-11-30 DIAGNOSIS — K219 Gastro-esophageal reflux disease without esophagitis: Secondary | ICD-10-CM

## 2021-12-01 ENCOUNTER — Other Ambulatory Visit: Payer: Self-pay | Admitting: Cardiology

## 2021-12-01 ENCOUNTER — Other Ambulatory Visit: Payer: Self-pay | Admitting: Family Medicine

## 2021-12-01 DIAGNOSIS — N401 Enlarged prostate with lower urinary tract symptoms: Secondary | ICD-10-CM

## 2021-12-01 DIAGNOSIS — E119 Type 2 diabetes mellitus without complications: Secondary | ICD-10-CM

## 2021-12-09 DIAGNOSIS — R079 Chest pain, unspecified: Secondary | ICD-10-CM | POA: Diagnosis not present

## 2021-12-09 DIAGNOSIS — R410 Disorientation, unspecified: Secondary | ICD-10-CM | POA: Diagnosis not present

## 2021-12-09 DIAGNOSIS — I10 Essential (primary) hypertension: Secondary | ICD-10-CM | POA: Diagnosis not present

## 2021-12-09 DIAGNOSIS — E785 Hyperlipidemia, unspecified: Secondary | ICD-10-CM | POA: Diagnosis not present

## 2021-12-09 DIAGNOSIS — Z87891 Personal history of nicotine dependence: Secondary | ICD-10-CM | POA: Diagnosis not present

## 2021-12-09 DIAGNOSIS — E119 Type 2 diabetes mellitus without complications: Secondary | ICD-10-CM | POA: Diagnosis not present

## 2021-12-09 DIAGNOSIS — G319 Degenerative disease of nervous system, unspecified: Secondary | ICD-10-CM | POA: Diagnosis not present

## 2021-12-09 DIAGNOSIS — I6523 Occlusion and stenosis of bilateral carotid arteries: Secondary | ICD-10-CM | POA: Diagnosis not present

## 2021-12-09 DIAGNOSIS — M542 Cervicalgia: Secondary | ICD-10-CM | POA: Diagnosis not present

## 2021-12-09 DIAGNOSIS — J3489 Other specified disorders of nose and nasal sinuses: Secondary | ICD-10-CM | POA: Diagnosis not present

## 2021-12-09 DIAGNOSIS — I739 Peripheral vascular disease, unspecified: Secondary | ICD-10-CM | POA: Diagnosis not present

## 2021-12-10 ENCOUNTER — Telehealth: Payer: Self-pay

## 2021-12-10 DIAGNOSIS — M542 Cervicalgia: Secondary | ICD-10-CM | POA: Diagnosis not present

## 2021-12-10 NOTE — Telephone Encounter (Signed)
Fyi.

## 2021-12-10 NOTE — Telephone Encounter (Signed)
Pt did go to ED  Client [facility] Day - Client Client Site [facility] - Day Provider Copland, Janett Billow- MD Contact Type Call Who Is Calling Patient / Member / Family / Caregiver Call Type Triage / Clinical Relationship To Patient Self Return Phone Number [phone] (Primary) Chief Complaint Blood Pressure High Reason for Call Symptomatic / Request for Health Information Initial Comment Caller states this morning he woke up with pain in the left side of his neck, shoulder blade, and back. His blood pressure is 178/98. Translation No Nurse Assessment Nurse: Breeding, RN, [place] Date/Time (Eastern Time): 12/09/2021 4:23:09 PM Confirm and document reason for call. If symptomatic, describe symptoms. ---Caller states this morning he woke up with pain in the left side of his neck, shoulder blade, and back. Caller notes diarrhea. Covid negative. His blood pressure is 178/98 (@1600 ). Caller denies any other s/s at this time. Symptoms started around 8am.  12/09/2021 4:33:38 PM Go to ED Now Yes Breeding, RN, [place]

## 2021-12-22 DIAGNOSIS — E119 Type 2 diabetes mellitus without complications: Secondary | ICD-10-CM | POA: Diagnosis not present

## 2021-12-22 DIAGNOSIS — E785 Hyperlipidemia, unspecified: Secondary | ICD-10-CM | POA: Diagnosis not present

## 2021-12-22 DIAGNOSIS — I10 Essential (primary) hypertension: Secondary | ICD-10-CM | POA: Diagnosis not present

## 2022-01-22 DIAGNOSIS — E119 Type 2 diabetes mellitus without complications: Secondary | ICD-10-CM | POA: Diagnosis not present

## 2022-01-22 DIAGNOSIS — I10 Essential (primary) hypertension: Secondary | ICD-10-CM | POA: Diagnosis not present

## 2022-01-22 DIAGNOSIS — E785 Hyperlipidemia, unspecified: Secondary | ICD-10-CM | POA: Diagnosis not present

## 2022-02-05 ENCOUNTER — Other Ambulatory Visit: Payer: Self-pay

## 2022-02-05 ENCOUNTER — Encounter: Payer: Self-pay | Admitting: Family Medicine

## 2022-02-05 DIAGNOSIS — I10 Essential (primary) hypertension: Secondary | ICD-10-CM

## 2022-02-05 DIAGNOSIS — F411 Generalized anxiety disorder: Secondary | ICD-10-CM

## 2022-02-05 DIAGNOSIS — E119 Type 2 diabetes mellitus without complications: Secondary | ICD-10-CM

## 2022-02-05 MED ORDER — ROSUVASTATIN CALCIUM 40 MG PO TABS: 40.0000 mg | ORAL_TABLET | Freq: Every day | ORAL | 3 refills | Status: DC

## 2022-02-05 MED ORDER — METFORMIN HCL 1000 MG PO TABS: 1000.0000 mg | ORAL_TABLET | Freq: Two times a day (BID) | ORAL | 0 refills | Status: DC

## 2022-02-05 MED ORDER — BUSPIRONE HCL 15 MG PO TABS: 15.0000 mg | ORAL_TABLET | Freq: Two times a day (BID) | ORAL | 1 refills | Status: DC

## 2022-02-05 MED ORDER — LISINOPRIL 40 MG PO TABS: ORAL_TABLET | ORAL | 3 refills | Status: DC

## 2022-02-05 NOTE — Telephone Encounter (Signed)
I have received a RX paper script request from [facility]. I have pended the medications for provider approval.  ? ?Attempted to call pt to let him know that he is due for an office visit so I left a VM asking him to give [place] a call back to get it scheduled. Also wanted to confirm if [facility] is the pharmacy he wants the medication sent to since he does have a few pharmacies started on his profile.  ? ?FYI to provider.  ?

## 2022-02-09 ENCOUNTER — Other Ambulatory Visit: Payer: Self-pay

## 2022-02-09 DIAGNOSIS — N401 Enlarged prostate with lower urinary tract symptoms: Secondary | ICD-10-CM

## 2022-02-09 DIAGNOSIS — I10 Essential (primary) hypertension: Secondary | ICD-10-CM

## 2022-02-09 DIAGNOSIS — K219 Gastro-esophageal reflux disease without esophagitis: Secondary | ICD-10-CM

## 2022-02-09 MED ORDER — TAMSULOSIN HCL 0.4 MG PO CAPS: ORAL_CAPSULE | ORAL | 0 refills | Status: DC

## 2022-02-09 MED ORDER — AMLODIPINE BESYLATE 10 MG PO TABS: 10.0000 mg | ORAL_TABLET | Freq: Every day | ORAL | 3 refills | Status: DC

## 2022-02-09 MED ORDER — CLONIDINE HCL 0.2 MG PO TABS: 0.2000 mg | ORAL_TABLET | Freq: Two times a day (BID) | ORAL | 0 refills | Status: DC

## 2022-02-09 MED ORDER — PANTOPRAZOLE SODIUM 40 MG PO TBEC: 40.0000 mg | DELAYED_RELEASE_TABLET | Freq: Every day | ORAL | 3 refills | Status: DC

## 2022-02-09 MED ORDER — CARVEDILOL 25 MG PO TABS: 25.0000 mg | ORAL_TABLET | Freq: Two times a day (BID) | ORAL | 0 refills | Status: DC

## 2022-02-09 MED ORDER — SPIRONOLACTONE 25 MG PO TABS: 25.0000 mg | ORAL_TABLET | Freq: Every day | ORAL | 0 refills | Status: DC

## 2022-02-13 NOTE — Progress Notes (Addendum)
Therapist, music at [employer] ?[address] [place], [address] ?[place], [place] [address] ?336 [phone] ?Fax [phone] ? ?Date:  02/17/2022  ? ?Name:  David Singh   DOB:  April 07, 1951   MRN:  [id number] ? ?PCP:  Darreld Mclean, MD  ? ? ?Chief Complaint: Pain (And labs/Concerns/ questions: 1. pt says he has an appointment with sports med soon for his left arm. 2. BP is unstable. [name] Exam: October 2022- Dr Floydene Flock) ? ? ?History of Present Illness: ? ?David Singh is a 71 y.o. very pleasant male patient who presents with the following: ? ?Patient seen today for follow-up ?Most recent visit with myself was in July ?History of diabetes, CAD with stent, hyperlipidemia, hypertension, significant peripheral arterial disease ?He notes his PAD is stable.  He can walk about the same distance before he gets pain ?He did start doing some swimming for exercise- he notes he does not have pain when he is in the pool.  He is swimming some laps  ? ?He does have endocrinology care, also sees urology and vascular surgery ?He has peripheral vascular disease causes symptoms after walking about 50 yards ? ?He does have BPH.  Urology has suggested surgery but for the time being he prefers conservative care ?At her last visit David Singh admitted he was having some depression.  He did not wish to start medication at that time ? ?He has declined COVID-19 and shingles vaccination so far ?Eye exam-  UTD  ?Foot exam can be updated ?Cologuard due next year ?Can update labs today,?  PSA done by urology- pt reports he needs this  ?Lab Results  ?Component Value Date  ? HGBA1C 6.5 (A) 03/10/2021  ? ?Amlodipine 10 ?Lisinopril 40 ?Aspirin ?Carvedilol 25 twice daily ?Clonidine 0.2 twice daily ?Metformin thousand twice daily ?Spironolactone 25 ?Flomax ?Crestor 40 ? ?He has noted some variable BP recently- might feel lightheaded at times  ?Here today with his wife David Singh  ? ?Pt would like to see cardiology closer to home in  [place]  ? ?Patient Active Problem List  ? Diagnosis Date Noted  ? Primary osteoarthritis of right knee 03/03/2020  ? Chronic gout 03/09/2016  ? Hypertriglyceridemia 01/16/2015  ? Type 2 diabetes mellitus ([place]) 01/06/2015  ? CAD in native artery 01/06/2015  ? History of coronary artery stent placement 01/06/2015  ? PAD (peripheral artery disease) ([place]) 01/06/2015  ? Hyperlipidemia 01/06/2015  ? Essential hypertension 01/06/2015  ? ? ?Past Medical History:  ?Diagnosis Date  ? CAD (coronary artery disease)   ? Cancer [facility])   ? basal cell excision upper right ear 7 yrs ago  ? Complication of anesthesia   ? bad pain with anesthesia induction with colonscopy 6 yrs ago  ? Diabetes mellitus without complication ([place])   ? type 2  ? GERD (gastroesophageal reflux disease)   ? Hyperlipidemia   ? Hypertension   ? Peripheral vascular disease ([place])   ? partial clogged artery right leg  ? Sleep apnea   ? yrs ago sleep study no cpap used  ? ? ?Past Surgical History:  ?Procedure Laterality Date  ? ABDOMINAL AORTOGRAM W/LOWER EXTREMITY N/A 12/27/2016  ? Procedure: Abdominal Aortogram w/Lower Extremity;  Surgeon: Angelia Mould, MD;  Location: [facility];  Service: Cardiovascular;  Laterality: N/A;  ? CARDIAC SURGERY    ? CORONARY ANGIOPLASTY WITH STENT PLACEMENT  11/2004  ? in [place]  ? MENISCUS REPAIR Right   ? NASAL SINUS SURGERY  1976  ? TONSILLECTOMY    ? ? ?Social History  ? ?Tobacco Use  ? Smoking status: Former  ?  Packs/day: 1.50  ?  Years: 26.00  ?  Pack years: 39.00  ?  Types: Cigarettes  ?  Quit date: 29  ?  Years since quitting: 29.3  ? Smokeless tobacco: Former  ?Vaping Use  ? Vaping Use: Never used  ?Substance Use Topics  ? Alcohol use: Yes  ?  Alcohol/week: 0.0 standard drinks  ?  Comment: 3 ounces on monday  ? Drug use: No  ? ? ?Family History  ?Problem Relation Age of Onset  ? Alcohol abuse Brother   ? Cancer Brother   ? Diabetes Brother   ? Hyperlipidemia Brother   ? Hypertension Brother   ?  Diabetes Mother   ? Hypertension Mother   ? Hyperlipidemia Mother   ? Heart attack Mother   ? Hyperlipidemia Father   ? Hypertension Father   ? ? ?Allergies  ?Allergen Reactions  ? Dexilant [Dexlansoprazole]   ?  Pressure in stomach and chest  ? ? ?Medication list has been reviewed and updated. ? ?Current Outpatient Medications on File Prior to Visit  ?Medication Sig Dispense Refill  ? amLODipine (NORVASC) 10 MG tablet Take 1 tablet (10 mg total) by mouth daily. 90 tablet 3  ? aspirin 81 MG tablet Take 81 mg by mouth daily.    ? Blood Glucose Monitoring Suppl (FREESTYLE LITE) DEVI CHECK BLOOD SUGAR ONCE DAILY  0  ? Blood Glucose Monitoring Suppl (FREESTYLE LITE) DEVI CHECK BLOOD SUGAR ONCE DAILY 1 each 0  ? busPIRone (BUSPAR) 15 MG tablet Take 1 tablet (15 mg total) by mouth 2 (two) times daily. 180 tablet 1  ? carvedilol (COREG) 25 MG tablet Take 1 tablet (25 mg total) by mouth 2 (two) times daily. Please schedule an appointment with Dr. Stanford Breed for further refills. 1ST ATTEMPT. 60 tablet 0  ? Cholecalciferol (VITAMIN D3) 2000 units TABS Take 2,000 Units by mouth daily.    ? cloNIDine (CATAPRES) 0.2 MG tablet Take 1 tablet (0.2 mg total) by mouth 2 (two) times daily. Please schedule an appointment with Dr. Stanford Breed for further refills. 1ST ATTEMPT. 60 tablet 0  ? Coenzyme Q10 300 MG CAPS Take 300 mg by mouth daily.     ? Continuous Blood Gluc Receiver (FREESTYLE LIBRE 2 READER) DEVI 1 Device by Does not apply route daily. 2 each 6  ? Continuous Blood Gluc Sensor (FREESTYLE LIBRE 2 SENSOR) MISC 1 Device by Does not apply route daily. 2 each 6  ? Cyanocobalamin (VITAMIN B-12 PO) Take 2,500 mg by mouth daily.    ? diphenoxylate-atropine (LOMOTIL) 2.5-0.025 MG tablet Take 1 tablet by mouth every 6 (six) hours as needed for diarrhea or loose stools. 60 tablet 1  ? glucose blood (FREESTYLE LITE) test strip Check blood sugar 1-3x daily 100 each 12  ? Lancets (FREESTYLE) lancets Check blood sugar once daily 100 each 12  ?  lisinopril (ZESTRIL) 40 MG tablet TAKE 1.5 TABLETS BY MOUTH DAILY. 135 tablet 3  ? meloxicam (MOBIC) 15 MG tablet Take 1 tablet (15 mg total) by mouth daily as needed for pain. 90 tablet 1  ? metFORMIN (GLUCOPHAGE) 1000 MG tablet Take 1 tablet (1,000 mg total) by mouth 2 (two) times daily with a meal. 180 tablet 0  ? MULTIPLE VITAMIN PO Take 1 tablet by mouth daily.     ? niacin 500 MG tablet Take 500 mg by  mouth every morning.     ? Omega-3 Fatty Acids (EQL OMEGA 3 FISH OIL) 1000 MG CAPS Take 1 capsule by mouth 2 (two) times daily.    ? pantoprazole (PROTONIX) 40 MG tablet Take 1 tablet (40 mg total) by mouth daily. 90 tablet 3  ? rosuvastatin (CRESTOR) 40 MG tablet Take 1 tablet (40 mg total) by mouth daily. 90 tablet 3  ? spironolactone (ALDACTONE) 25 MG tablet Take 1 tablet (25 mg total) by mouth daily. Please schedule an appointment with Dr. Stanford Breed for further refills. 1ST ATTEMPT. 30 tablet 0  ? tamsulosin (FLOMAX) 0.4 MG CAPS capsule TAKE 1 CAPSULE EVERY DAY MAY INCREASE TO 2 AFTER 2 WEEKS IF NEED FOR PROSTATE SYMPTOMS 120 capsule 0  ? ?No current facility-administered medications on file prior to visit.  ? ? ?Review of Systems: ? ?As per HPI- otherwise negative. ? ? ?Physical Examination: ?Vitals:  ? 02/17/22 0817  ?BP: 132/80  ?Pulse: 69  ?Resp: 18  ?Temp: 98.4 ?F (36.9 ?C)  ?SpO2: 98%  ? ?Vitals:  ? 02/17/22 0817  ?Weight: 286 lb (129.7 kg)  ?Height: '5\' 9"'$  (1.753 m)  ? ?Body mass index is 42.23 kg/m?. ?Ideal Body Weight: Weight in (lb) to have BMI = 25: 168.9 ? ?GEN: no acute distress.  Obese, looks well  ?HEENT: Atraumatic, Normocephalic.  Bilateral TM wnl, oropharynx normal.  PEERL,EOMI.   ?Ears and Nose: No external deformity. ?CV: RRR, No M/G/R. No JVD. No thrill. No extra heart sounds. ?PULM: CTA B, no wheezes, crackles, rhonchi. No retractions. No resp. distress. No accessory muscle use. ?ABD: S, NT, ND, +BS. No rebound. No HSM. Umbilical hernia  ?EXTR: No c/c/e ?PSYCH: Normally interactive.  Conversant.  ?Foot exam done  ? ?Assessment and Plan: ?Essential hypertension - Plan: CBC, Comprehensive metabolic panel, Ambulatory referral to Cardiology, carvedilol (COREG) 25 MG tablet, cloNIDine (CATAPRE

## 2022-02-13 NOTE — Patient Instructions (Addendum)
It was good to see you again today ?I will be in touch with your labs ?Update Cologuard next year ?I continue to recommend COVID-19 vaccination!  Also suggest shingles vaccine-both can be given at your pharmacy ? ?If your BP is too low, ok to take a 1/2 dose of amlodipine OR lisinopril  ?We can set up a 24 BP for you at any time if you need ? ?Referrals placed to both cardiology and urology with [facility] in [place] for you  ? ?Please see me in about 6 months and take care- keep up the swimming ?

## 2022-02-17 ENCOUNTER — Ambulatory Visit (INDEPENDENT_AMBULATORY_CARE_PROVIDER_SITE_OTHER): Payer: Medicare Other | Admitting: Family Medicine

## 2022-02-17 VITALS — BP 132/80 | HR 69 | Temp 98.4°F | Resp 18 | Ht 69.0 in | Wt 286.0 lb

## 2022-02-17 DIAGNOSIS — N401 Enlarged prostate with lower urinary tract symptoms: Secondary | ICD-10-CM

## 2022-02-17 DIAGNOSIS — I10 Essential (primary) hypertension: Secondary | ICD-10-CM | POA: Diagnosis not present

## 2022-02-17 DIAGNOSIS — E1159 Type 2 diabetes mellitus with other circulatory complications: Secondary | ICD-10-CM | POA: Diagnosis not present

## 2022-02-17 DIAGNOSIS — E785 Hyperlipidemia, unspecified: Secondary | ICD-10-CM

## 2022-02-17 DIAGNOSIS — Z125 Encounter for screening for malignant neoplasm of prostate: Secondary | ICD-10-CM

## 2022-02-17 DIAGNOSIS — E875 Hyperkalemia: Secondary | ICD-10-CM

## 2022-02-17 DIAGNOSIS — I739 Peripheral vascular disease, unspecified: Secondary | ICD-10-CM | POA: Diagnosis not present

## 2022-02-17 DIAGNOSIS — R3914 Feeling of incomplete bladder emptying: Secondary | ICD-10-CM

## 2022-02-17 DIAGNOSIS — D649 Anemia, unspecified: Secondary | ICD-10-CM

## 2022-02-17 LAB — LIPID PANEL
Cholesterol: 115 mg/dL (ref 0–200)
HDL: 33.3 mg/dL — ABNORMAL LOW (ref >39.00)
NonHDL: 81.83
Total CHOL/HDL Ratio: 3
Triglycerides: 239 mg/dL — ABNORMAL HIGH (ref 0.0–149.0)
VLDL: 47.8 mg/dL — ABNORMAL HIGH (ref 0.0–40.0)

## 2022-02-17 LAB — COMPREHENSIVE METABOLIC PANEL
ALT: 20 U/L (ref 0–53)
AST: 17 U/L (ref 0–37)
Albumin: 4.5 g/dL (ref 3.5–5.2)
Alkaline Phosphatase: 38 U/L — ABNORMAL LOW (ref 39–117)
BUN: 18 mg/dL (ref 6–23)
CO2: 27 mEq/L (ref 19–32)
Calcium: 9.4 mg/dL (ref 8.4–10.5)
Chloride: 100 mEq/L (ref 96–112)
Creatinine, Ser: 1.07 mg/dL (ref 0.40–1.50)
GFR: 70.48 mL/min (ref >60.00)
Glucose, Bld: 149 mg/dL — ABNORMAL HIGH (ref 70–99)
Potassium: 5.3 mEq/L — ABNORMAL HIGH (ref 3.5–5.1)
Sodium: 135 mEq/L (ref 135–145)
Total Bilirubin: 0.4 mg/dL (ref 0.2–1.2)
Total Protein: 6.9 g/dL (ref 6.0–8.3)

## 2022-02-17 LAB — CBC
HCT: 37.3 % — ABNORMAL LOW (ref 39.0–52.0)
Hemoglobin: 12.2 g/dL — ABNORMAL LOW (ref 13.0–17.0)
MCHC: 32.7 g/dL (ref 30.0–36.0)
MCV: 83.6 fl (ref 78.0–100.0)
Platelets: 157 10*3/uL (ref 150.0–400.0)
RBC: 4.47 Mil/uL (ref 4.22–5.81)
RDW: 14.9 % (ref 11.5–15.5)
WBC: 5.4 10*3/uL (ref 4.0–10.5)

## 2022-02-17 LAB — MICROALBUMIN / CREATININE URINE RATIO
Creatinine,U: 37.2 mg/dL
Microalb Creat Ratio: 4.4 mg/g (ref 0.0–30.0)
Microalb, Ur: 1.6 mg/dL (ref 0.0–1.9)

## 2022-02-17 LAB — PSA: PSA: 1.75 ng/mL (ref 0.10–4.00)

## 2022-02-17 LAB — HEMOGLOBIN A1C: Hgb A1c MFr Bld: 7.5 % — ABNORMAL HIGH (ref 4.6–6.5)

## 2022-02-17 LAB — LDL CHOLESTEROL, DIRECT: Direct LDL: 53 mg/dL

## 2022-02-17 MED ORDER — CARVEDILOL 25 MG PO TABS: 25.0000 mg | ORAL_TABLET | Freq: Two times a day (BID) | ORAL | 3 refills | Status: DC

## 2022-02-17 MED ORDER — CLONIDINE HCL 0.2 MG PO TABS: 0.2000 mg | ORAL_TABLET | Freq: Two times a day (BID) | ORAL | 1 refills | Status: DC

## 2022-02-18 ENCOUNTER — Encounter: Payer: Self-pay | Admitting: Family Medicine

## 2022-02-18 DIAGNOSIS — E1159 Type 2 diabetes mellitus with other circulatory complications: Secondary | ICD-10-CM

## 2022-02-18 NOTE — Addendum Note (Signed)
Addended by: Lamar Blinks C on: 02/18/2022 02:48 PM ? ? Modules accepted: Orders ? ?

## 2022-02-18 NOTE — Addendum Note (Signed)
Addended by: Lamar Blinks C on: 02/18/2022 06:41 AM ? ? Modules accepted: Orders ? ?

## 2022-02-21 DIAGNOSIS — I10 Essential (primary) hypertension: Secondary | ICD-10-CM | POA: Diagnosis not present

## 2022-02-21 DIAGNOSIS — E119 Type 2 diabetes mellitus without complications: Secondary | ICD-10-CM | POA: Diagnosis not present

## 2022-02-21 DIAGNOSIS — E785 Hyperlipidemia, unspecified: Secondary | ICD-10-CM | POA: Diagnosis not present

## 2022-02-23 ENCOUNTER — Other Ambulatory Visit: Payer: Self-pay | Admitting: Family Medicine

## 2022-02-23 DIAGNOSIS — E119 Type 2 diabetes mellitus without complications: Secondary | ICD-10-CM

## 2022-02-23 DIAGNOSIS — N401 Enlarged prostate with lower urinary tract symptoms: Secondary | ICD-10-CM

## 2022-02-24 ENCOUNTER — Other Ambulatory Visit: Payer: Self-pay | Admitting: Family Medicine

## 2022-03-01 DIAGNOSIS — M7542 Impingement syndrome of left shoulder: Secondary | ICD-10-CM | POA: Diagnosis not present

## 2022-03-01 DIAGNOSIS — M25512 Pain in left shoulder: Secondary | ICD-10-CM | POA: Diagnosis not present

## 2022-03-02 ENCOUNTER — Encounter: Payer: Self-pay | Admitting: Family Medicine

## 2022-03-04 ENCOUNTER — Encounter: Payer: Self-pay | Admitting: Family Medicine

## 2022-03-04 ENCOUNTER — Telehealth: Payer: Self-pay

## 2022-03-04 NOTE — Telephone Encounter (Signed)
Pts pharmacy sent a fax for Glimepiride refill. I see this on the Rec tab-do you prescribe this? ?

## 2022-03-22 ENCOUNTER — Other Ambulatory Visit: Payer: Self-pay | Admitting: Internal Medicine

## 2022-03-23 ENCOUNTER — Other Ambulatory Visit (INDEPENDENT_AMBULATORY_CARE_PROVIDER_SITE_OTHER): Payer: Medicare Other

## 2022-03-23 ENCOUNTER — Other Ambulatory Visit: Payer: Self-pay | Admitting: Cardiology

## 2022-03-23 DIAGNOSIS — D649 Anemia, unspecified: Secondary | ICD-10-CM | POA: Diagnosis not present

## 2022-03-23 DIAGNOSIS — E785 Hyperlipidemia, unspecified: Secondary | ICD-10-CM | POA: Diagnosis not present

## 2022-03-23 DIAGNOSIS — E119 Type 2 diabetes mellitus without complications: Secondary | ICD-10-CM | POA: Diagnosis not present

## 2022-03-23 DIAGNOSIS — E875 Hyperkalemia: Secondary | ICD-10-CM | POA: Diagnosis not present

## 2022-03-23 DIAGNOSIS — I10 Essential (primary) hypertension: Secondary | ICD-10-CM

## 2022-03-23 LAB — CBC
HCT: 34.3 % — ABNORMAL LOW (ref 39.0–52.0)
Hemoglobin: 11.3 g/dL — ABNORMAL LOW (ref 13.0–17.0)
MCHC: 32.8 g/dL (ref 30.0–36.0)
MCV: 83.1 fl (ref 78.0–100.0)
Platelets: 155 10*3/uL (ref 150.0–400.0)
RBC: 4.13 Mil/uL — ABNORMAL LOW (ref 4.22–5.81)
RDW: 14.8 % (ref 11.5–15.5)
WBC: 6.4 10*3/uL (ref 4.0–10.5)

## 2022-03-23 LAB — POTASSIUM: Potassium: 4.6 mEq/L (ref 3.5–5.1)

## 2022-03-24 DIAGNOSIS — E119 Type 2 diabetes mellitus without complications: Secondary | ICD-10-CM | POA: Diagnosis not present

## 2022-03-24 DIAGNOSIS — I10 Essential (primary) hypertension: Secondary | ICD-10-CM | POA: Diagnosis not present

## 2022-03-24 DIAGNOSIS — E785 Hyperlipidemia, unspecified: Secondary | ICD-10-CM | POA: Diagnosis not present

## 2022-03-25 ENCOUNTER — Other Ambulatory Visit: Payer: Self-pay | Admitting: Family Medicine

## 2022-03-25 ENCOUNTER — Encounter: Payer: Self-pay | Admitting: Family Medicine

## 2022-03-25 DIAGNOSIS — D649 Anemia, unspecified: Secondary | ICD-10-CM

## 2022-03-29 DIAGNOSIS — M1711 Unilateral primary osteoarthritis, right knee: Secondary | ICD-10-CM | POA: Diagnosis not present

## 2022-03-29 DIAGNOSIS — M65161 Other infective (teno)synovitis, right knee: Secondary | ICD-10-CM | POA: Diagnosis not present

## 2022-03-30 DIAGNOSIS — M65161 Other infective (teno)synovitis, right knee: Secondary | ICD-10-CM | POA: Diagnosis not present

## 2022-03-30 DIAGNOSIS — M1711 Unilateral primary osteoarthritis, right knee: Secondary | ICD-10-CM | POA: Diagnosis not present

## 2022-04-09 ENCOUNTER — Other Ambulatory Visit: Payer: Self-pay | Admitting: Internal Medicine

## 2022-04-12 ENCOUNTER — Other Ambulatory Visit: Payer: Self-pay | Admitting: Family Medicine

## 2022-04-12 DIAGNOSIS — E119 Type 2 diabetes mellitus without complications: Secondary | ICD-10-CM

## 2022-04-13 ENCOUNTER — Other Ambulatory Visit (INDEPENDENT_AMBULATORY_CARE_PROVIDER_SITE_OTHER): Payer: Medicare Other

## 2022-04-13 DIAGNOSIS — D649 Anemia, unspecified: Secondary | ICD-10-CM

## 2022-04-13 LAB — FECAL OCCULT BLOOD, IMMUNOCHEMICAL: Fecal Occult Bld: NEGATIVE

## 2022-04-13 NOTE — Addendum Note (Signed)
Addended by: Manuela Schwartz on: 04/13/2022 01:08 PM   Modules accepted: Orders

## 2022-04-14 ENCOUNTER — Other Ambulatory Visit: Payer: Self-pay | Admitting: Family Medicine

## 2022-04-14 ENCOUNTER — Encounter: Payer: Self-pay | Admitting: Family Medicine

## 2022-04-14 DIAGNOSIS — D649 Anemia, unspecified: Secondary | ICD-10-CM

## 2022-04-15 ENCOUNTER — Other Ambulatory Visit: Payer: Self-pay | Admitting: Family Medicine

## 2022-04-15 DIAGNOSIS — N401 Enlarged prostate with lower urinary tract symptoms: Secondary | ICD-10-CM

## 2022-04-22 DIAGNOSIS — I10 Essential (primary) hypertension: Secondary | ICD-10-CM | POA: Diagnosis not present

## 2022-04-22 DIAGNOSIS — E785 Hyperlipidemia, unspecified: Secondary | ICD-10-CM | POA: Diagnosis not present

## 2022-04-22 DIAGNOSIS — E119 Type 2 diabetes mellitus without complications: Secondary | ICD-10-CM | POA: Diagnosis not present

## 2022-04-23 ENCOUNTER — Telehealth: Payer: Self-pay | Admitting: Family Medicine

## 2022-04-23 DIAGNOSIS — I10 Essential (primary) hypertension: Secondary | ICD-10-CM | POA: Diagnosis not present

## 2022-04-23 DIAGNOSIS — E119 Type 2 diabetes mellitus without complications: Secondary | ICD-10-CM | POA: Diagnosis not present

## 2022-04-23 DIAGNOSIS — E785 Hyperlipidemia, unspecified: Secondary | ICD-10-CM | POA: Diagnosis not present

## 2022-04-23 NOTE — Telephone Encounter (Signed)
Are you okay with accepting the pt's wife?
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
## 2022-04-23 NOTE — Telephone Encounter (Signed)
Okay to accept pts wife.

## 2022-04-24 ENCOUNTER — Other Ambulatory Visit: Payer: Self-pay | Admitting: Cardiology

## 2022-04-28 ENCOUNTER — Other Ambulatory Visit: Payer: Self-pay | Admitting: Family Medicine

## 2022-04-28 DIAGNOSIS — I10 Essential (primary) hypertension: Secondary | ICD-10-CM

## 2022-05-01 ENCOUNTER — Other Ambulatory Visit: Payer: Self-pay | Admitting: Internal Medicine

## 2022-05-10 NOTE — Progress Notes (Signed)
HPI: FU CAD. Patient had PCI in [place] previously. No records available. Patient is followed by vascular surgery for peripheral vascular disease. Angiogram March 2018 showed no renal artery stenosis. 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 There was reconstitution of the distal right common iliac artery; it was felt medical therapy indicated. Nuclear study April 2018 showed ejection fraction 54% with no ischemia or infarction. ABIs 1/20 showed moderate disease on the right and normal on the left. Patient apparently had attempt at iliac recanalization in [facility] which was unsuccessful March 2020.  Abdominal ultrasound May 2021 showed no aneurysm.  Since last seen, he has dyspnea with more vigorous activities.  No orthopnea, PND, pedal edema, chest pain or syncope.  He continues to have significant claudication in right lower extremity.  Current Outpatient Medications  Medication Sig Dispense Refill   amLODipine (NORVASC) 10 MG tablet Take 1 tablet (10 mg total) by mouth daily. 90 tablet 3   aspirin 81 MG tablet Take 81 mg by mouth daily.     Blood Glucose Monitoring Suppl (FREESTYLE LITE) DEVI CHECK BLOOD SUGAR ONCE DAILY  0   Blood Glucose Monitoring Suppl (FREESTYLE LITE) DEVI CHECK BLOOD SUGAR ONCE DAILY 1 each 0   busPIRone (BUSPAR) 15 MG tablet Take 1 tablet (15 mg total) by mouth 2 (two) times daily. 180 tablet 1   carvedilol (COREG) 25 MG tablet Take 1 tablet (25 mg total) by mouth 2 (two) times daily. 180 tablet 3   Cholecalciferol (VITAMIN D3) 2000 units TABS Take 2,000 Units by mouth daily.     cloNIDine (CATAPRES) 0.2 MG tablet TAKE 1 TABLET BY MOUTH TWICE  DAILY 200 tablet 2   Coenzyme Q10 300 MG CAPS Take 300 mg by mouth daily.      Continuous Blood Gluc Receiver (FREESTYLE LIBRE 2 READER) DEVI 1 Device by Does not apply route daily. 2 each 6   Continuous Blood Gluc Sensor (FREESTYLE LIBRE 2 SENSOR) MISC 1  Device by Does not apply route daily. 2 each 6   Cyanocobalamin (VITAMIN B-12 PO) Take 2,500 mg by mouth daily.     diphenoxylate-atropine (LOMOTIL) 2.5-0.025 MG tablet Take 1 tablet by mouth every 6 (six) hours as needed for diarrhea or loose stools. 60 tablet 1   glucose blood (FREESTYLE LITE) test strip Check blood sugar 1-3x daily 100 each 12   Lancets (FREESTYLE) lancets Check blood sugar once daily 100 each 12   lisinopril (ZESTRIL) 40 MG tablet TAKE 1.5 TABLETS BY MOUTH DAILY. 135 tablet 3   meloxicam (MOBIC) 15 MG tablet Take 1 tablet (15 mg total) by mouth daily as needed for pain. 90 tablet 1   metFORMIN (GLUCOPHAGE) 1000 MG tablet TAKE 1 TABLET BY MOUTH TWICE  DAILY WITH MEALS 180 tablet 3   MULTIPLE VITAMIN PO Take 1 tablet by mouth daily.      niacin 500 MG tablet Take 500 mg by mouth every morning.      Omega-3 Fatty Acids (EQL OMEGA 3 FISH OIL) 1000 MG CAPS Take 1 capsule by mouth 2 (two) times daily.     pantoprazole (PROTONIX) 40 MG tablet Take 1 tablet (40 mg total) by mouth daily. 90 tablet 3   rosuvastatin (CRESTOR) 40 MG tablet TAKE 1 TABLET BY MOUTH EVERY DAY 90 tablet 1   spironolactone (ALDACTONE) 25 MG tablet TAKE 1 TABLET BY MOUTH DAILY 30 tablet 11   tamsulosin (  FLOMAX) 0.4 MG CAPS capsule TAKE 1 CAPSULE EVERY DAY MAY INCREASE TO 2 AFTER 2 WEEKS IF NEED FOR PROSTATE SYMPTOMS 180 capsule 1   No current facility-administered medications for this visit.     Past Medical History:  Diagnosis Date   CAD (coronary artery disease)    Cancer ([place])    basal cell excision upper right ear 7 yrs ago   Complication of anesthesia    bad pain with anesthesia induction with colonscopy 6 yrs ago   Diabetes mellitus without complication ([facility])    type 2   GERD (gastroesophageal reflux disease)    Hyperlipidemia    Hypertension    Peripheral vascular disease ([place])    partial clogged artery right leg   Sleep apnea    yrs ago sleep study no cpap used    Past Surgical  History:  Procedure Laterality Date   ABDOMINAL AORTOGRAM W/LOWER EXTREMITY N/A 12/27/2016   Procedure: Abdominal Aortogram w/Lower Extremity;  Surgeon: Angelia Mould, MD;  Location: [facility];  Service: Cardiovascular;  Laterality: N/A;   CARDIAC SURGERY     CORONARY ANGIOPLASTY WITH STENT PLACEMENT  11/2004   in [place] Right    NASAL SINUS SURGERY  1976   TONSILLECTOMY      Social History   Socioeconomic History   Marital status: Married    Spouse name: Not on file   Number of children: 2   Years of education: Not on file   Highest education level: Not on file  Occupational History   Not on file  Tobacco Use   Smoking status: Former    Packs/day: 1.50    Years: 26.00    Total pack years: 39.00    Types: Cigarettes    Quit date: 29    Years since quitting: 29.5   Smokeless tobacco: Former  Scientific laboratory technician Use: Never used  Substance and Sexual Activity   Alcohol use: Yes    Alcohol/week: 0.0 standard drinks of alcohol    Comment: 3 ounces on monday   Drug use: No   Sexual activity: Yes  Other Topics Concern   Not on file  Social History Narrative   Not on file   Social Determinants of Health   Financial Resource Strain: Low Risk  (06/02/2021)   Overall Financial Resource Strain (CARDIA)    Difficulty of Paying Living Expenses: Not hard at all  Food Insecurity: No Food Insecurity (06/02/2021)   Hunger Vital Sign    Worried About Running Out of Food in the Last Year: Never true    [place] in the Last Year: Never true  Transportation Needs: No Transportation Needs (06/02/2021)   PRAPARE - Hydrologist (Medical): No    Lack of Transportation (Non-Medical): No  Physical Activity: Inactive (06/02/2021)   Exercise Vital Sign    Days of Exercise per Week: 0 days    Minutes of Exercise per Session: 0 min  Stress: No Stress Concern Present (06/02/2021)   [place]    Feeling of Stress : Only a little  Social Connections: Moderately Isolated (06/02/2021)   Social Connection and Isolation Panel [NHANES]    Frequency of Communication with Friends and Family: More than three times a week    Frequency of Social Gatherings with Friends and Family: More than three times a week    Attends Religious Services: Never  Active Member of Clubs or Organizations: No    Attends Archivist Meetings: Never    Marital Status: Married  Human resources officer Violence: Not At Risk (06/02/2021)   Humiliation, Afraid, Rape, and Kick questionnaire    Fear of Current or Ex-Partner: No    Emotionally Abused: No    Physically Abused: No    Sexually Abused: No    Family History  Problem Relation Age of Onset   Alcohol abuse Brother    Cancer Brother    Diabetes Brother    Hyperlipidemia Brother    Hypertension Brother    Diabetes Mother    Hypertension Mother    Hyperlipidemia Mother    Heart attack Mother    Hyperlipidemia Father    Hypertension Father     ROS: no fevers or chills, productive cough, hemoptysis, dysphasia, odynophagia, melena, hematochezia, dysuria, hematuria, rash, seizure activity, orthopnea, PND, pedal edema, claudication. Remaining systems are negative.  Physical Exam: Well-developed well-nourished in no acute distress.  Skin is warm and dry.  HEENT is normal.  Neck is supple.  Chest is clear to auscultation with normal expansion.  Cardiovascular exam is regular rate and rhythm.  Abdominal exam nontender or distended. No masses palpated. Extremities show no edema. neuro grossly intact  ECG-normal sinus rhythm at a rate of 70, no ST changes.  Personally reviewed  A/P  1 coronary artery disease-patient doing well with no recurrent chest pain.  Continue aspirin and statin.  2 hypertension-blood pressure controlled.  Continue present medical regimen.  3 hyperlipidemia-continue statin.  4 morbid  obesity-we again discussed the importance of weight loss.  5 peripheral vascular disease-patient is followed by vascular surgery.  Note previous attempt at recanalization of iliac was unsuccessful.  Kirk Ruths, MD

## 2022-05-19 ENCOUNTER — Encounter: Payer: Self-pay | Admitting: Cardiology

## 2022-05-19 ENCOUNTER — Ambulatory Visit: Payer: Medicare Other | Admitting: Cardiology

## 2022-05-19 VITALS — BP 118/78 | HR 70 | Ht 69.0 in | Wt 280.1 lb

## 2022-05-19 DIAGNOSIS — I739 Peripheral vascular disease, unspecified: Secondary | ICD-10-CM

## 2022-05-19 DIAGNOSIS — E7800 Pure hypercholesterolemia, unspecified: Secondary | ICD-10-CM

## 2022-05-19 DIAGNOSIS — I2510 Atherosclerotic heart disease of native coronary artery without angina pectoris: Secondary | ICD-10-CM

## 2022-05-19 DIAGNOSIS — I10 Essential (primary) hypertension: Secondary | ICD-10-CM | POA: Diagnosis not present

## 2022-05-19 NOTE — Patient Instructions (Signed)

## 2022-05-22 DIAGNOSIS — E785 Hyperlipidemia, unspecified: Secondary | ICD-10-CM | POA: Diagnosis not present

## 2022-05-22 DIAGNOSIS — E119 Type 2 diabetes mellitus without complications: Secondary | ICD-10-CM | POA: Diagnosis not present

## 2022-05-22 DIAGNOSIS — I10 Essential (primary) hypertension: Secondary | ICD-10-CM | POA: Diagnosis not present

## 2022-05-24 DIAGNOSIS — I10 Essential (primary) hypertension: Secondary | ICD-10-CM | POA: Diagnosis not present

## 2022-05-24 DIAGNOSIS — E119 Type 2 diabetes mellitus without complications: Secondary | ICD-10-CM | POA: Diagnosis not present

## 2022-05-24 DIAGNOSIS — E785 Hyperlipidemia, unspecified: Secondary | ICD-10-CM | POA: Diagnosis not present

## 2022-05-31 DIAGNOSIS — R339 Retention of urine, unspecified: Secondary | ICD-10-CM | POA: Diagnosis not present

## 2022-05-31 DIAGNOSIS — N138 Other obstructive and reflux uropathy: Secondary | ICD-10-CM | POA: Diagnosis not present

## 2022-06-08 ENCOUNTER — Ambulatory Visit: Payer: Medicare HMO

## 2022-06-14 DIAGNOSIS — H61191 Noninfective disorders of pinna, right ear: Secondary | ICD-10-CM | POA: Diagnosis not present

## 2022-06-14 DIAGNOSIS — D485 Neoplasm of uncertain behavior of skin: Secondary | ICD-10-CM | POA: Diagnosis not present

## 2022-06-14 DIAGNOSIS — D2339 Other benign neoplasm of skin of other parts of face: Secondary | ICD-10-CM | POA: Diagnosis not present

## 2022-06-14 DIAGNOSIS — Z85828 Personal history of other malignant neoplasm of skin: Secondary | ICD-10-CM | POA: Diagnosis not present

## 2022-06-21 DIAGNOSIS — I10 Essential (primary) hypertension: Secondary | ICD-10-CM | POA: Diagnosis not present

## 2022-06-21 DIAGNOSIS — E119 Type 2 diabetes mellitus without complications: Secondary | ICD-10-CM | POA: Diagnosis not present

## 2022-06-21 DIAGNOSIS — E785 Hyperlipidemia, unspecified: Secondary | ICD-10-CM | POA: Diagnosis not present

## 2022-06-24 DIAGNOSIS — I10 Essential (primary) hypertension: Secondary | ICD-10-CM | POA: Diagnosis not present

## 2022-06-24 DIAGNOSIS — E119 Type 2 diabetes mellitus without complications: Secondary | ICD-10-CM | POA: Diagnosis not present

## 2022-07-06 DIAGNOSIS — M1711 Unilateral primary osteoarthritis, right knee: Secondary | ICD-10-CM | POA: Diagnosis not present

## 2022-07-06 DIAGNOSIS — M25812 Other specified joint disorders, left shoulder: Secondary | ICD-10-CM | POA: Diagnosis not present

## 2022-07-07 ENCOUNTER — Encounter: Payer: Self-pay | Admitting: Family Medicine

## 2022-07-07 MED ORDER — GLIPIZIDE ER 2.5 MG PO TB24
2.5000 mg | ORAL_TABLET | Freq: Every day | ORAL | 3 refills | Status: DC
Start: 2022-07-07 — End: 2022-07-16

## 2022-07-16 ENCOUNTER — Other Ambulatory Visit: Payer: Self-pay

## 2022-07-16 MED ORDER — GLIPIZIDE ER 2.5 MG PO TB24: 2.5000 mg | ORAL_TABLET | Freq: Every day | ORAL | 3 refills | Status: DC

## 2022-07-24 DIAGNOSIS — E119 Type 2 diabetes mellitus without complications: Secondary | ICD-10-CM | POA: Diagnosis not present

## 2022-07-24 DIAGNOSIS — I10 Essential (primary) hypertension: Secondary | ICD-10-CM | POA: Diagnosis not present

## 2022-07-27 ENCOUNTER — Ambulatory Visit: Payer: Medicare Other

## 2022-08-05 ENCOUNTER — Ambulatory Visit (INDEPENDENT_AMBULATORY_CARE_PROVIDER_SITE_OTHER): Payer: Medicare Other | Admitting: *Deleted

## 2022-08-05 VITALS — BP 133/81

## 2022-08-05 DIAGNOSIS — Z0000 Encounter for general adult medical examination without abnormal findings: Secondary | ICD-10-CM | POA: Diagnosis not present

## 2022-08-05 NOTE — Patient Instructions (Signed)
[name] , Thank you for taking time to come for your Medicare Wellness Visit. I appreciate your ongoing commitment to your health goals. Please review the following plan we discussed and let me know if I can assist you in the future.   These are the goals we discussed:  Goals      Increase physical activity     Patient Stated     Maintain current health        This is a list of the screening recommended for you and due dates:  Health Maintenance  Topic Date Due   COVID-19 Vaccine (1) Never done   Zoster (Shingles) Vaccine (1 of 2) Never done   Flu Shot  05/25/2022   Eye exam for diabetics  07/25/2022   Hemoglobin A1C  08/19/2022   Yearly kidney function blood test for diabetes  02/18/2023   Yearly kidney health urinalysis for diabetes  02/18/2023   Complete foot exam   02/18/2023   Cologuard (Stool DNA test)  07/04/2023   Tetanus Vaccine  12/09/2026   Pneumonia Vaccine  Completed   Hepatitis C Screening: USPSTF Recommendation to screen - Ages 43-79 yo.  Completed   HPV Vaccine  Aged Out   Colon Cancer Screening  Discontinued     Next appointment: Follow up in one year for your annual wellness visit.   Preventive Care 26 Years and Older, Male Preventive care refers to lifestyle choices and visits with your health care provider that can promote health and wellness. What does preventive care include? A yearly physical exam. This is also called an annual well check. Dental exams once or twice a year. Routine eye exams. Ask your health care provider how often you should have your eyes checked. Personal lifestyle choices, including: Daily care of your teeth and gums. Regular physical activity. Eating a healthy diet. Avoiding tobacco and drug use. Limiting alcohol use. Practicing safe sex. Taking low doses of aspirin every day. Taking vitamin and mineral supplements as recommended by your health care provider. What happens during an annual well check? The services and  screenings done by your health care provider during your annual well check will depend on your age, overall health, lifestyle risk factors, and family history of disease. Counseling  Your health care provider may ask you questions about your: Alcohol use. Tobacco use. Drug use. Emotional well-being. Home and relationship well-being. Sexual activity. Eating habits. History of falls. Memory and ability to understand (cognition). Work and work Statistician. Screening  You may have the following tests or measurements: Height, weight, and BMI. Blood pressure. Lipid and cholesterol levels. These may be checked every 5 years, or more frequently if you are over 98 years old. Skin check. Lung cancer screening. You may have this screening every year starting at age 66 if you have a 30-pack-year history of smoking and currently smoke or have quit within the past 15 years. Fecal occult blood test (FOBT) of the stool. You may have this test every year starting at age 38. Flexible sigmoidoscopy or colonoscopy. You may have a sigmoidoscopy every 5 years or a colonoscopy every 10 years starting at age 85. Prostate cancer screening. Recommendations will vary depending on your family history and other risks. Hepatitis C blood test. Hepatitis B blood test. Sexually transmitted disease (STD) testing. Diabetes screening. This is done by checking your blood sugar (glucose) after you have not eaten for a while (fasting). You may have this done every 1-3 years. Abdominal aortic aneurysm (AAA) screening.  You may need this if you are a current or former smoker. Osteoporosis. You may be screened starting at age 9 if you are at high risk. Talk with your health care provider about your test results, treatment options, and if necessary, the need for more tests. Vaccines  Your health care provider may recommend certain vaccines, such as: Influenza vaccine. This is recommended every year. Tetanus, diphtheria, and  acellular pertussis (Tdap, Td) vaccine. You may need a Td booster every 10 years. Zoster vaccine. You may need this after age 50. Pneumococcal 13-valent conjugate (PCV13) vaccine. One dose is recommended after age 66. Pneumococcal polysaccharide (PPSV23) vaccine. One dose is recommended after age 69. Talk to your health care provider about which screenings and vaccines you need and how often you need them. This information is not intended to replace advice given to you by your health care provider. Make sure you discuss any questions you have with your health care provider. Document Released: 11/07/2015 Document Revised: 06/30/2016 Document Reviewed: 08/12/2015 Elsevier Interactive Patient Education  2017 [place] Prevention in the Home Falls can cause injuries. They can happen to people of all ages. There are many things you can do to make your home safe and to help prevent falls. What can I do on the outside of my home? Regularly fix the edges of walkways and driveways and fix any cracks. Remove anything that might make you trip as you walk through a door, such as a raised step or threshold. Trim any bushes or trees on the path to your home. Use bright outdoor lighting. Clear any walking paths of anything that might make someone trip, such as rocks or tools. Regularly check to see if handrails are loose or broken. Make sure that both sides of any steps have handrails. Any raised decks and porches should have guardrails on the edges. Have any leaves, snow, or ice cleared regularly. Use sand or salt on walking paths during winter. Clean up any spills in your garage right away. This includes oil or grease spills. What can I do in the bathroom? Use night lights. Install grab bars by the toilet and in the tub and shower. Do not use towel bars as grab bars. Use non-skid mats or decals in the tub or shower. If you need to sit down in the shower, use a plastic, non-slip stool. Keep  the floor dry. Clean up any water that spills on the floor as soon as it happens. Remove soap buildup in the tub or shower regularly. Attach bath mats securely with double-sided non-slip rug tape. Do not have throw rugs and other things on the floor that can make you trip. What can I do in the bedroom? Use night lights. Make sure that you have a light by your bed that is easy to reach. Do not use any sheets or blankets that are too big for your bed. They should not hang down onto the floor. Have a firm chair that has side arms. You can use this for support while you get dressed. Do not have throw rugs and other things on the floor that can make you trip. What can I do in the kitchen? Clean up any spills right away. Avoid walking on wet floors. Keep items that you use a lot in easy-to-reach places. If you need to reach something above you, use a strong step stool that has a grab bar. Keep electrical cords out of the way. Do not use floor polish or wax  that makes floors slippery. If you must use wax, use non-skid floor wax. Do not have throw rugs and other things on the floor that can make you trip. What can I do with my stairs? Do not leave any items on the stairs. Make sure that there are handrails on both sides of the stairs and use them. Fix handrails that are broken or loose. Make sure that handrails are as long as the stairways. Check any carpeting to make sure that it is firmly attached to the stairs. Fix any carpet that is loose or worn. Avoid having throw rugs at the top or bottom of the stairs. If you do have throw rugs, attach them to the floor with carpet tape. Make sure that you have a light switch at the top of the stairs and the bottom of the stairs. If you do not have them, ask someone to add them for you. What else can I do to help prevent falls? Wear shoes that: Do not have high heels. Have rubber bottoms. Are comfortable and fit you well. Are closed at the toe. Do not  wear sandals. If you use a stepladder: Make sure that it is fully opened. Do not climb a closed stepladder. Make sure that both sides of the stepladder are locked into place. Ask someone to hold it for you, if possible. Clearly mark and make sure that you can see: Any grab bars or handrails. First and last steps. Where the edge of each step is. Use tools that help you move around (mobility aids) if they are needed. These include: Canes. Walkers. Scooters. Crutches. Turn on the lights when you go into a dark area. Replace any light bulbs as soon as they burn out. Set up your furniture so you have a clear path. Avoid moving your furniture around. If any of your floors are uneven, fix them. If there are any pets around you, be aware of where they are. Review your medicines with your doctor. Some medicines can make you feel dizzy. This can increase your chance of falling. Ask your doctor what other things that you can do to help prevent falls. This information is not intended to replace advice given to you by your health care provider. Make sure you discuss any questions you have with your health care provider. Document Released: 08/07/2009 Document Revised: 03/18/2016 Document Reviewed: 11/15/2014 Elsevier Interactive Patient Education  2017 [employer].

## 2022-08-05 NOTE — Progress Notes (Signed)
Subjective:   David Singh is a 71 y.o. male who presents for Medicare Annual/Subsequent preventive examination.  I connected with  Jolyn Lent on 08/05/22 by a audio enabled telemedicine application and verified that I am speaking with the correct person using two identifiers.  Patient Location: Home  Provider Location: Office/Clinic  I discussed the limitations of evaluation and management by telemedicine. The patient expressed understanding and agreed to proceed.   Review of Systems    Defer to PCP Cardiac Risk Factors include: advanced age (>44mn, >>49women);diabetes mellitus;dyslipidemia;male gender;hypertension     Objective:    There were no vitals filed for this visit. There is no height or weight on file to calculate BMI.     08/05/2022    9:04 AM 06/02/2021   11:10 AM 05/30/2020   10:21 AM 11/10/2019    5:38 PM 12/28/2017    2:26 PM 06/29/2017   11:45 AM 12/27/2016    6:25 AM  Advanced Directives  Does Patient Have a Medical Advance Directive? Yes Yes Yes No Yes Yes Yes  Type of [place] will [place] will [place] will  [place] will  [place] will  Does patient want to make changes to medical advance directive? No - Patient declined  No - Patient declined    No - Patient declined  Copy of [place] Chart? No - copy requested No - copy requested No - copy requested    No - copy requested  Would patient like information on creating a medical advance directive?    No - Patient declined       Current Medications (verified) Outpatient Encounter Medications as of 08/05/2022  Medication Sig   amLODipine (NORVASC) 10 MG tablet Take 1 tablet (10 mg total) by mouth daily.   aspirin 81 MG tablet Take 81 mg by mouth daily.   busPIRone (BUSPAR) 15 MG tablet Take 1 tablet (15 mg total) by  mouth 2 (two) times daily.   carvedilol (COREG) 25 MG tablet Take 1 tablet (25 mg total) by mouth 2 (two) times daily.   Cholecalciferol (VITAMIN D3) 2000 units TABS Take 2,000 Units by mouth daily.   cloNIDine (CATAPRES) 0.2 MG tablet TAKE 1 TABLET BY MOUTH TWICE  DAILY   Coenzyme Q10 300 MG CAPS Take 300 mg by mouth daily.    Continuous Blood Gluc Receiver (FREESTYLE LIBRE 2 READER) DEVI 1 Device by Does not apply route daily.   Continuous Blood Gluc Sensor (FREESTYLE LIBRE 2 SENSOR) MISC 1 Device by Does not apply route daily.   Cyanocobalamin (VITAMIN B-12 PO) Take 2,500 mg by mouth daily.   diphenoxylate-atropine (LOMOTIL) 2.5-0.025 MG tablet Take 1 tablet by mouth every 6 (six) hours as needed for diarrhea or loose stools.   glipiZIDE (GLUCOTROL XL) 2.5 MG 24 hr tablet Take 1 tablet (2.5 mg total) by mouth daily with breakfast.   glucose blood (FREESTYLE LITE) test strip Check blood sugar 1-3x daily   Lancets (FREESTYLE) lancets Check blood sugar once daily   lisinopril (ZESTRIL) 40 MG tablet TAKE 1.5 TABLETS BY MOUTH DAILY.   meloxicam (MOBIC) 15 MG tablet Take 1 tablet (15 mg total) by mouth daily as needed for pain.   metFORMIN (GLUCOPHAGE) 1000 MG tablet TAKE 1 TABLET BY MOUTH TWICE  DAILY WITH MEALS   MULTIPLE VITAMIN PO Take 1 tablet by mouth daily.    niacin  500 MG tablet Take 500 mg by mouth every morning.    pantoprazole (PROTONIX) 40 MG tablet Take 1 tablet (40 mg total) by mouth daily.   rosuvastatin (CRESTOR) 40 MG tablet TAKE 1 TABLET BY MOUTH EVERY DAY   spironolactone (ALDACTONE) 25 MG tablet TAKE 1 TABLET BY MOUTH DAILY   tamsulosin (FLOMAX) 0.4 MG CAPS capsule TAKE 1 CAPSULE EVERY DAY MAY INCREASE TO 2 AFTER 2 WEEKS IF NEED FOR PROSTATE SYMPTOMS   [DISCONTINUED] Blood Glucose Monitoring Suppl (FREESTYLE LITE) DEVI CHECK BLOOD SUGAR ONCE DAILY   [DISCONTINUED] Blood Glucose Monitoring Suppl (FREESTYLE LITE) DEVI CHECK BLOOD SUGAR ONCE DAILY   [DISCONTINUED] Omega-3 Fatty  Acids (EQL OMEGA 3 FISH OIL) 1000 MG CAPS Take 1 capsule by mouth 2 (two) times daily.   No facility-administered encounter medications on file as of 08/05/2022.    Allergies (verified) Dexilant [dexlansoprazole]   History: Past Medical History:  Diagnosis Date   CAD (coronary artery disease)    Cancer ([place])    basal cell excision upper right ear 7 yrs ago   Complication of anesthesia    bad pain with anesthesia induction with colonscopy 6 yrs ago   Diabetes mellitus without complication (HCC)    type 2   GERD (gastroesophageal reflux disease)    Hyperlipidemia    Hypertension    Peripheral vascular disease ([place])    partial clogged artery right leg   Sleep apnea    yrs ago sleep study no cpap used   Past Surgical History:  Procedure Laterality Date   ABDOMINAL AORTOGRAM W/LOWER EXTREMITY N/A 12/27/2016   Procedure: Abdominal Aortogram w/Lower Extremity;  Surgeon: Angelia Mould, MD;  Location: [facility];  Service: Cardiovascular;  Laterality: N/A;   CARDIAC SURGERY     CORONARY ANGIOPLASTY WITH STENT PLACEMENT  11/2004   in [place] Right    NASAL SINUS SURGERY  1976   TONSILLECTOMY     Family History  Problem Relation Age of Onset   Alcohol abuse Brother    Cancer Brother    Diabetes Brother    Hyperlipidemia Brother    Hypertension Brother    Diabetes Mother    Hypertension Mother    Hyperlipidemia Mother    Heart attack Mother    Hyperlipidemia Father    Hypertension Father    Social History   Socioeconomic History   Marital status: Married    Spouse name: Not on file   Number of children: 2   Years of education: Not on file   Highest education level: Not on file  Occupational History   Not on file  Tobacco Use   Smoking status: Former    Packs/day: 1.50    Years: 26.00    Total pack years: 39.00    Types: Cigarettes    Quit date: 1994    Years since quitting: 29.7   Smokeless tobacco: Former  Scientific laboratory technician  Use: Never used  Substance and Sexual Activity   Alcohol use: Yes    Alcohol/week: 0.0 standard drinks of alcohol    Comment: 3 ounces on monday   Drug use: No   Sexual activity: Yes  Other Topics Concern   Not on file  Social History Narrative   Not on file   Social Determinants of Health   Financial Resource Strain: Low Risk  (06/02/2021)   Overall Financial Resource Strain (CARDIA)    Difficulty of Paying Living Expenses: Not hard at all  Food Insecurity: No Food Insecurity (06/02/2021)   Hunger Vital Sign    Worried About Running Out of Food in the Last Year: Never true    Ran Out of Food in the Last Year: Never true  Transportation Needs: No Transportation Needs (06/02/2021)   PRAPARE - Hydrologist (Medical): No    Lack of Transportation (Non-Medical): No  Physical Activity: Inactive (06/02/2021)   Exercise Vital Sign    Days of Exercise per Week: 0 days    Minutes of Exercise per Session: 0 min  Stress: No Stress Concern Present (06/02/2021)   [place]    Feeling of Stress : Only a little  Social Connections: Moderately Isolated (06/02/2021)   Social Connection and Isolation Panel [NHANES]    Frequency of Communication with Friends and Family: More than three times a week    Frequency of Social Gatherings with Friends and Family: More than three times a week    Attends Religious Services: Never    Marine scientist or Organizations: No    Attends Music therapist: Never    Marital Status: Married    Tobacco Counseling Counseling given: Not Answered   Clinical Intake:  Pre-visit preparation completed: Yes  Pain : No/denies pain  How often do you need to have someone help you when you read instructions, pamphlets, or other written materials from your doctor or pharmacy?: 1 - Never  Diabetic? Yes Nutrition Risk Assessment:  Has the patient had any N/V/D  within the last 2 months?  Yes  diarrhea occasionally Does the patient have any non-healing wounds?  No  Has the patient had any unintentional weight loss or weight gain?  No   Diabetes:  Is the patient diabetic?  Yes  If diabetic, was a CBG obtained today?  No  Did the patient bring in their glucometer from home?   Audio visit How often do you monitor your CBG's? At least once a day.   Financial Strains and Diabetes Management:  Are you having any financial strains with the device, your supplies or your medication? No .  Does the patient want to be seen by Chronic Care Management for management of their diabetes?  No  Would the patient like to be referred to a Nutritionist or for Diabetic Management?  No   Diabetic Exams:  Diabetic Eye Exam: Completed 07/25/21 Diabetic Foot Exam: Completed 02/17/22    Interpreter Needed?: No  Information entered by :: Beatris Ship, [place]   Activities of Daily Living    08/05/2022    9:06 AM  In your present state of health, do you have any difficulty performing the following activities:  Hearing? 1  Comment wears hearing aids  Vision? 0  Difficulty concentrating or making decisions? 0  Walking or climbing stairs? 0  Dressing or bathing? 0  Doing errands, shopping? 0  Preparing Food and eating ? N  Using the Toilet? N  In the past six months, have you accidently leaked urine? Y  Comment having prostate surgery Aug 30, 2022  Do you have problems with loss of bowel control? N  Managing your Medications? N  Managing your Finances? N  Housekeeping or managing your Housekeeping? N    Patient Care Team: Copland, Gay Filler, MD as PCP - General (Family Medicine) Stanford Breed Denice Bors, MD as Consulting Physician (Cardiology) Alyson Ingles Candee Furbish, MD as Consulting Physician (Urology)  Indicate any recent Medical Services  you may have received from other than [facility] providers in the past year (date may be approximate).     Assessment:   This is a  routine wellness examination for [place].  Hearing/Vision screen No results found.  Dietary issues and exercise activities discussed: Current Exercise Habits: The patient does not participate in regular exercise at present, Exercise limited by: orthopedic condition(s);Other - see comments (untreatable blod clot in lower leg that causes pain)   Goals Addressed             This Visit's Progress    Patient Stated   On track    Maintain current health       Depression Screen    08/05/2022    9:05 AM 02/17/2022    8:17 AM 06/02/2021   11:14 AM 05/20/2021   10:49 AM 05/30/2020   10:29 AM 02/18/2020    8:23 AM 10/06/2017    9:15 AM  PHQ 2/9 Scores  PHQ - 2 Score 1 0 0 4 0 0 1  PHQ- 9 Score    11       Fall Risk    08/05/2022    9:05 AM 02/17/2022    8:17 AM 06/02/2021   11:11 AM 05/20/2021   10:19 AM 05/30/2020   10:28 AM  Fall Risk   Falls in the past year? 1 0 0 0 0  Comment tripped by dog      Number falls in past yr: 0 0 0 0 0  Injury with Fall? 0 0 0 0 0  Risk for fall due to : No Fall Risks   No Fall Risks   Follow up Falls evaluation completed  Falls prevention discussed Falls evaluation completed Education provided;Falls prevention discussed    FALL RISK PREVENTION PERTAINING TO THE HOME:  Any stairs in or around the home? No  If so, are there any without handrails?  No stairs Home free of loose throw rugs in walkways, pet beds, electrical cords, etc? Yes  Adequate lighting in your home to reduce risk of falls? Yes   ASSISTIVE DEVICES UTILIZED TO PREVENT FALLS:  Life alert? No  Use of a cane, walker or w/c? No  Grab bars in the bathroom? Yes  Shower chair or bench in shower? No  Elevated toilet seat or a handicapped toilet? No   TIMED UP AND GO:  Was the test performed?  Audio visit .    Cognitive Function:        08/05/2022    9:15 AM  6CIT Screen  What Year? 0 points  What month? 0 points  What time? 0 points  Count back from 20 0 points  Months  in reverse 2 points  Repeat phrase 2 points  Total Score 4 points    Immunizations Immunization History  Administered Date(s) Administered   Influenza Split 06/11/2020, 09/23/2021   Influenza, High Dose Seasonal PF 06/23/2017, 06/25/2018, 06/14/2019   Influenza-Unspecified 07/24/2015, 07/22/2016, 06/23/2017   Pneumococcal Conjugate-13 01/10/2017   Pneumococcal Polysaccharide-23 05/22/2018   Tdap 12/09/2016   Zoster, Live 07/24/2015    TDAP status: Up to date  Flu Vaccine status: Due, Education has been provided regarding the importance of this vaccine. Advised may receive this vaccine at local pharmacy or Health Dept. Aware to provide a copy of the vaccination record if obtained from local pharmacy or Health Dept. Verbalized acceptance and understanding.  Pneumococcal vaccine status: Up to date  Covid-19 vaccine status: Information provided on how to obtain vaccines.  Qualifies for Shingles Vaccine? Yes   Zostavax completed Yes   Shingrix Completed?: No.    Education has been provided regarding the importance of this vaccine. Patient has been advised to call insurance company to determine out of pocket expense if they have not yet received this vaccine. Advised may also receive vaccine at local pharmacy or Health Dept. Verbalized acceptance and understanding.  Screening Tests Health Maintenance  Topic Date Due   COVID-19 Vaccine (1) Never done   Zoster Vaccines- Shingrix (1 of 2) Never done   INFLUENZA VACCINE  05/25/2022   OPHTHALMOLOGY EXAM  07/25/2022   HEMOGLOBIN A1C  08/19/2022   Diabetic kidney evaluation - GFR measurement  02/18/2023   Diabetic kidney evaluation - Urine ACR  02/18/2023   FOOT EXAM  02/18/2023   Fecal DNA (Cologuard)  07/04/2023   TETANUS/TDAP  12/09/2026   Pneumonia Vaccine 41+ Years old  Completed   Hepatitis C Screening  Completed   HPV VACCINES  Aged Out   COLONOSCOPY (Pts 45-44yr Insurance coverage will need to be confirmed)  Discontinued     Health Maintenance  Health Maintenance Due  Topic Date Due   COVID-19 Vaccine (1) Never done   Zoster Vaccines- Shingrix (1 of 2) Never done   INFLUENZA VACCINE  05/25/2022   OPHTHALMOLOGY EXAM  07/25/2022    Colorectal cancer screening: Type of screening: Cologuard. Completed 07/03/20. Repeat every 3 years  Lung Cancer Screening: (Low Dose CT Chest recommended if Age 70-80years, 30 pack-year currently smoking OR have quit w/in 15years.) does not qualify.   Lung Cancer Screening Referral: N/a  Additional Screening:  Hepatitis C Screening: does qualify; Completed 12/09/16  Vision Screening: Recommended annual ophthalmology exams for early detection of glaucoma and other disorders of the eye. Is the patient up to date with their annual eye exam?  Yes  Who is the provider or what is the name of the office in which the patient attends annual eye exams? Dr. RAntionette Fairy(spelling) If pt is not established with a provider, would they like to be referred to a provider to establish care? No .   Dental Screening: Recommended annual dental exams for proper oral hygiene  Community Resource Referral / Chronic Care Management: CRR required this visit?  No   CCM required this visit?  No      Plan:     I have personally reviewed and noted the following in the patient's chart:   Medical and social history Use of alcohol, tobacco or illicit drugs  Current medications and supplements including opioid prescriptions. Patient is not currently taking opioid prescriptions. Functional ability and status Nutritional status Physical activity Advanced directives List of other physicians Hospitalizations, surgeries, and ER visits in previous 12 months Vitals Screenings to include cognitive, depression, and falls Referrals and appointments  In addition, I have reviewed and discussed with patient certain preventive protocols, quality metrics, and best practice recommendations. A written  personalized care plan for preventive services as well as general preventive health recommendations were provided to patient.   Due to this being a telephonic visit, the after visit summary with patients personalized plan was offered to patient via mail or my-chart. Patient would like to access on my-chart.  BBeatris Ship [place]  08/05/2022   Nurse Notes: None

## 2022-08-10 NOTE — Patient Instructions (Addendum)
Good to see you again today- I will be in touch with your labs. Assuming all is well please see me in about 6 months Stop glipizide!   We can have you start on [facility] for your blood sugar and weight loss; start with 2.5 mg weekly Let me know how this seems to be working for you, we can go up gradually as needed If you like ok to reduce metformin once you are on the [facility]- might cut back to a 1/2 tablet am, full tablet pm

## 2022-08-10 NOTE — Progress Notes (Signed)
[place] at [facility] [address], [place], [place] [address] [phone]  Date:  08/16/2022   Name:  David Singh   DOB:  13-Sep-1951   MRN:  [id number]  PCP:  Darreld Mclean, MD    Chief Complaint: 6 month follow up (Concerns/ questions: 1. Asks about a CGM. 2. Pt asks about weight loss/DM drugs /DM eye exam:  scheduled for this Friday/Shingrix: none in [facility])   History of Present Illness:  David Singh is a 71 y.o. very pleasant male patient who presents with the following:  Patient seen today for 32-monthfollow-up Seen today accompanied by his wife Most recent visit with myself was in April History of diabetes, CAD with stent, hyperlipidemia, hypertension, significant peripheral arterial disease He notes his PAD is stable.  He can walk about the same distance before he gets pain He is going to have some prostate surgery next month per Dr CHenreitta Leber- he is having a lot of urinary frequnency and leaking   Specialist care includes endocrinology, urology, vascular surgery  He notes he may feel like his glucose is low on occasion- this will resolve after he eats sugar He does not typically check his glucose when he has the symptoms, but notes consuming calories will make the symptoms resolve Wt Readings from Last 3 Encounters:  08/16/22 270 lb 9.6 oz (122.7 kg)  05/19/22 280 lb 1.9 oz (127.1 kg)  02/17/22 286 lb (129.7 kg)   He is down about 15 lbs through some diet and exercise change We might consider dropping his glipizide due to possible hypoglycemia He is also interested in potentially starting a GLP-1 for diabetes control and weight loss.  No contraindication.  He feels comfortable giving an injection at home  Flu vaccine- done already  Has declined COVID, shingles Eye exam is due; happening this week   Lab Results  Component Value Date   HGBA1C 7.3 (H) 08/16/2022    Amlodipine Aspirin BuSpar Carvedilol 25 twice daily Clonidine 0.2 twice daily Glipizide 2.5 Lisinopril 40 Metformin thousand twice daily Pantoprazole Crestor 40 Spironolactone 25 Flomax  Patient Active Problem List   Diagnosis Date Noted   Primary osteoarthritis of right knee 03/03/2020   Chronic gout 03/09/2016   Hypertriglyceridemia 01/16/2015   Type 2 diabetes mellitus ([facility] [place] 01/06/2015   CAD in native artery 01/06/2015   History of coronary artery stent placement 01/06/2015   PAD (peripheral artery disease) ([facility] 01/06/2015   Hyperlipidemia 01/06/2015   Essential hypertension 01/06/2015    Past Medical History:  Diagnosis Date   CAD (coronary artery disease)    Cancer ([facility] [place]    basal cell excision upper right ear 7 yrs ago   Complication of anesthesia    bad pain with anesthesia induction with colonscopy 6 yrs ago   Diabetes mellitus without complication (HCC)    type 2   GERD (gastroesophageal reflux disease)    Hyperlipidemia    Hypertension    Peripheral vascular disease ([facility]    partial clogged artery right leg   Sleep apnea    yrs ago sleep study no cpap used    Past Surgical History:  Procedure Laterality Date   ABDOMINAL AORTOGRAM W/LOWER EXTREMITY N/A 12/27/2016   Procedure: Abdominal Aortogram w/Lower Extremity;  Surgeon: CAngelia Mould MD;  [facility];  Service: Cardiovascular;  Laterality: N/A;   CARDIAC SURGERY     CORONARY ANGIOPLASTY WITH STENT PLACEMENT  11/2004   in [place]    NASAL SINUS SURGERY  1976   TONSILLECTOMY      Social History   Tobacco Use   Smoking status: Former    Packs/day: 1.50    Years: 26.00    Total pack years: 39.00    Types: Cigarettes    Quit date: 1994    Years since quitting: 29.8   Smokeless tobacco: Former  Scientific laboratory technician Use: Never used  Substance Use Topics   Alcohol use: Yes    Alcohol/week: 0.0 standard drinks of alcohol    Comment: 3 ounces  on monday   Drug use: No    Family History  Problem Relation Age of Onset   Alcohol abuse Brother    Cancer Brother    Diabetes Brother    Hyperlipidemia Brother    Hypertension Brother    Diabetes Mother    Hypertension Mother    Hyperlipidemia Mother    Heart attack Mother    Hyperlipidemia Father    Hypertension Father     Allergies  Allergen Reactions   Dexilant [Dexlansoprazole]     Pressure in stomach and chest    Medication list has been reviewed and updated.  Current Outpatient Medications on File Prior to Visit  Medication Sig Dispense Refill   amLODipine (NORVASC) 10 MG tablet Take 1 tablet (10 mg total) by mouth daily. 90 tablet 3   aspirin 81 MG tablet Take 81 mg by mouth daily.     busPIRone (BUSPAR) 15 MG tablet Take 1 tablet (15 mg total) by mouth 2 (two) times daily. 180 tablet 1   carvedilol (COREG) 25 MG tablet Take 1 tablet (25 mg total) by mouth 2 (two) times daily. 180 tablet 3   Cholecalciferol (VITAMIN D3) 2000 units TABS Take 2,000 Units by mouth daily.     cloNIDine (CATAPRES) 0.2 MG tablet TAKE 1 TABLET BY MOUTH TWICE  DAILY 200 tablet 2   Coenzyme Q10 300 MG CAPS Take 300 mg by mouth daily.      Continuous Blood Gluc Receiver (FREESTYLE LIBRE 2 READER) DEVI 1 Device by Does not apply route daily. 2 each 6   Continuous Blood Gluc Sensor (FREESTYLE LIBRE 2 SENSOR) MISC 1 Device by Does not apply route daily. 2 each 6   Cyanocobalamin (VITAMIN B-12 PO) Take 2,500 mg by mouth daily.     diphenoxylate-atropine (LOMOTIL) 2.5-0.025 MG tablet Take 1 tablet by mouth every 6 (six) hours as needed for diarrhea or loose stools. 60 tablet 1   glucose blood (FREESTYLE LITE) test strip Check blood sugar 1-3x daily 100 each 12   Lancets (FREESTYLE) lancets Check blood sugar once daily 100 each 12   lisinopril (ZESTRIL) 40 MG tablet TAKE 1.5 TABLETS BY MOUTH DAILY. 135 tablet 3   meloxicam (MOBIC) 15 MG tablet Take 1 tablet (15 mg total) by mouth daily as needed  for pain. 90 tablet 1   metFORMIN (GLUCOPHAGE) 1000 MG tablet TAKE 1 TABLET BY MOUTH TWICE  DAILY WITH MEALS 180 tablet 3   MULTIPLE VITAMIN PO Take 1 tablet by mouth daily.      niacin 500 MG tablet Take 500 mg by mouth every morning.      pantoprazole (PROTONIX) 40 MG tablet Take 1 tablet (40 mg total) by mouth daily. 90 tablet 3   rosuvastatin (CRESTOR) 40 MG tablet TAKE 1 TABLET BY MOUTH EVERY DAY 90 tablet 1   spironolactone (ALDACTONE)  25 MG tablet TAKE 1 TABLET BY MOUTH DAILY 30 tablet 11   tamsulosin (FLOMAX) 0.4 MG CAPS capsule TAKE 1 CAPSULE EVERY DAY MAY INCREASE TO 2 AFTER 2 WEEKS IF NEED FOR PROSTATE SYMPTOMS 180 capsule 1   No current facility-administered medications on file prior to visit.    Review of Systems:  As per HPI- otherwise negative.   Physical Examination: Vitals:   08/16/22 0942  BP: 120/72  Pulse: 70  Resp: 18  Temp: 97.8 F (36.6 C)  SpO2: 96%   Vitals:   08/16/22 0942  Weight: 270 lb 9.6 oz (122.7 kg)  Height: '5\' 9"'$  (1.753 m)   Body mass index is 39.96 kg/m. Ideal Body Weight: Weight in (lb) to have BMI = 25: 168.9  GEN: no acute distress.  Obese, otherwise looks well HEENT: Atraumatic, Normocephalic.  Bilateral TM wnl, oropharynx normal.  PEERL,EOMI.   Ears and Nose: No external deformity. CV: RRR, No M/G/R. No JVD. No thrill. No extra heart sounds. PULM: CTA B, no wheezes, crackles, rhonchi. No retractions. No resp. distress. No accessory muscle use. ABD: S, NT, ND, +BS. No rebound. No HSM. EXTR: No c/c/e PSYCH: Normally interactive. Conversant.    Assessment and Plan: Essential hypertension - Plan: CBC, Comprehensive metabolic panel  PAD (peripheral artery disease) (HCC)  Benign prostatic hyperplasia with incomplete bladder emptying  Dyslipidemia - Plan: Lipid panel  Type 2 diabetes mellitus with other circulatory complication, without long-term current use of insulin (HCC) - Plan: Hemoglobin A1c, Continuous Blood Gluc Receiver  (FREESTYLE LIBRE 14 DAY READER) DEVI, Continuous Blood Gluc Sensor (FREESTYLE LIBRE 3 SENSOR) MISC, tirzepatide (MOUNJARO) 2.5 MG/0.5ML Pen  Mild anemia  Gastroesophageal reflux disease, unspecified whether esophagitis present  Seen today for follow-up. Blood pressure well controlled on current regimen He continues to follow-up with vascular surgery for his peripheral arterial disease-symptoms are stable Prostate resection surgery upcoming Discussed his diabetes.  He is having possible hypoglycemic episodes.  We will have him stop glipizide.  He is also interested in weight loss We decided to add Mounjaro 2.5, can cut back metformin once he is on Mounjaro.  Went over most common side effects, titration schedule  Signed Lamar Blinks, MD  Received labs as below, message to patient Results for orders placed or performed in visit on 08/16/22  CBC  Result Value Ref Range   WBC 5.6 4.0 - 10.5 K/uL   RBC 4.34 4.22 - 5.81 Mil/uL   Platelets 173.0 150.0 - 400.0 K/uL   Hemoglobin 11.7 (L) 13.0 - 17.0 g/dL   HCT 36.0 (L) 39.0 - 52.0 %   MCV 82.9 78.0 - 100.0 fl   MCHC 32.5 30.0 - 36.0 g/dL   RDW 15.2 11.5 - 15.5 %  Comprehensive metabolic panel  Result Value Ref Range   Sodium 134 (L) 135 - 145 mEq/L   Potassium 4.8 3.5 - 5.1 mEq/L   Chloride 100 96 - 112 mEq/L   CO2 25 19 - 32 mEq/L   Glucose, Bld 109 (H) 70 - 99 mg/dL   BUN 17 6 - 23 mg/dL   Creatinine, Ser 1.11 0.40 - 1.50 mg/dL   Total Bilirubin 0.4 0.2 - 1.2 mg/dL   Alkaline Phosphatase 36 (L) 39 - 117 U/L   AST 17 0 - 37 U/L   ALT 18 0 - 53 U/L   Total Protein 6.6 6.0 - 8.3 g/dL   Albumin 4.2 3.5 - 5.2 g/dL   GFR 67.21 >60.00 mL/min   Calcium 9.5  8.4 - 10.5 mg/dL  Hemoglobin A1c  Result Value Ref Range   Hgb A1c MFr Bld 7.3 (H) 4.6 - 6.5 %  Lipid panel  Result Value Ref Range   Cholesterol 100 0 - 200 mg/dL   Triglycerides 123.0 0.0 - 149.0 mg/dL   HDL 36.00 (L) >39.00 mg/dL   VLDL 24.6 0.0 - 40.0 mg/dL   LDL  Cholesterol 39 0 - 99 mg/dL   Total CHOL/HDL Ratio 3    NonHDL 63.88

## 2022-08-13 DIAGNOSIS — Z7982 Long term (current) use of aspirin: Secondary | ICD-10-CM | POA: Diagnosis not present

## 2022-08-13 DIAGNOSIS — I10 Essential (primary) hypertension: Secondary | ICD-10-CM | POA: Diagnosis not present

## 2022-08-13 DIAGNOSIS — I739 Peripheral vascular disease, unspecified: Secondary | ICD-10-CM | POA: Diagnosis not present

## 2022-08-13 DIAGNOSIS — I2510 Atherosclerotic heart disease of native coronary artery without angina pectoris: Secondary | ICD-10-CM | POA: Diagnosis not present

## 2022-08-13 DIAGNOSIS — E119 Type 2 diabetes mellitus without complications: Secondary | ICD-10-CM | POA: Diagnosis not present

## 2022-08-13 DIAGNOSIS — Z7902 Long term (current) use of antithrombotics/antiplatelets: Secondary | ICD-10-CM | POA: Diagnosis not present

## 2022-08-13 DIAGNOSIS — Z87891 Personal history of nicotine dependence: Secondary | ICD-10-CM | POA: Diagnosis not present

## 2022-08-13 DIAGNOSIS — E785 Hyperlipidemia, unspecified: Secondary | ICD-10-CM | POA: Diagnosis not present

## 2022-08-16 ENCOUNTER — Encounter: Payer: Self-pay | Admitting: Family Medicine

## 2022-08-16 ENCOUNTER — Ambulatory Visit (INDEPENDENT_AMBULATORY_CARE_PROVIDER_SITE_OTHER): Payer: Medicare Other | Admitting: Family Medicine

## 2022-08-16 ENCOUNTER — Other Ambulatory Visit: Payer: Self-pay | Admitting: Family Medicine

## 2022-08-16 VITALS — BP 120/72 | HR 70 | Temp 97.8°F | Resp 18 | Ht 69.0 in | Wt 270.6 lb

## 2022-08-16 DIAGNOSIS — R3914 Feeling of incomplete bladder emptying: Secondary | ICD-10-CM | POA: Diagnosis not present

## 2022-08-16 DIAGNOSIS — E1159 Type 2 diabetes mellitus with other circulatory complications: Secondary | ICD-10-CM | POA: Diagnosis not present

## 2022-08-16 DIAGNOSIS — E785 Hyperlipidemia, unspecified: Secondary | ICD-10-CM

## 2022-08-16 DIAGNOSIS — D649 Anemia, unspecified: Secondary | ICD-10-CM

## 2022-08-16 DIAGNOSIS — I739 Peripheral vascular disease, unspecified: Secondary | ICD-10-CM | POA: Diagnosis not present

## 2022-08-16 DIAGNOSIS — I10 Essential (primary) hypertension: Secondary | ICD-10-CM | POA: Diagnosis not present

## 2022-08-16 DIAGNOSIS — N401 Enlarged prostate with lower urinary tract symptoms: Secondary | ICD-10-CM | POA: Diagnosis not present

## 2022-08-16 DIAGNOSIS — K219 Gastro-esophageal reflux disease without esophagitis: Secondary | ICD-10-CM | POA: Diagnosis not present

## 2022-08-16 LAB — COMPREHENSIVE METABOLIC PANEL
ALT: 18 U/L (ref 0–53)
AST: 17 U/L (ref 0–37)
Albumin: 4.2 g/dL (ref 3.5–5.2)
Alkaline Phosphatase: 36 U/L — ABNORMAL LOW (ref 39–117)
BUN: 17 mg/dL (ref 6–23)
CO2: 25 mEq/L (ref 19–32)
Calcium: 9.5 mg/dL (ref 8.4–10.5)
Chloride: 100 mEq/L (ref 96–112)
Creatinine, Ser: 1.11 mg/dL (ref 0.40–1.50)
GFR: 67.21 mL/min (ref >60.00)
Glucose, Bld: 109 mg/dL — ABNORMAL HIGH (ref 70–99)
Potassium: 4.8 mEq/L (ref 3.5–5.1)
Sodium: 134 mEq/L — ABNORMAL LOW (ref 135–145)
Total Bilirubin: 0.4 mg/dL (ref 0.2–1.2)
Total Protein: 6.6 g/dL (ref 6.0–8.3)

## 2022-08-16 LAB — LIPID PANEL
Cholesterol: 100 mg/dL (ref 0–200)
HDL: 36 mg/dL — ABNORMAL LOW (ref >39.00)
LDL Cholesterol: 39 mg/dL (ref 0–99)
NonHDL: 63.88
Total CHOL/HDL Ratio: 3
Triglycerides: 123 mg/dL (ref 0.0–149.0)
VLDL: 24.6 mg/dL (ref 0.0–40.0)

## 2022-08-16 LAB — CBC
HCT: 36 % — ABNORMAL LOW (ref 39.0–52.0)
Hemoglobin: 11.7 g/dL — ABNORMAL LOW (ref 13.0–17.0)
MCHC: 32.5 g/dL (ref 30.0–36.0)
MCV: 82.9 fl (ref 78.0–100.0)
Platelets: 173 10*3/uL (ref 150.0–400.0)
RBC: 4.34 Mil/uL (ref 4.22–5.81)
RDW: 15.2 % (ref 11.5–15.5)
WBC: 5.6 10*3/uL (ref 4.0–10.5)

## 2022-08-16 LAB — HEMOGLOBIN A1C: Hgb A1c MFr Bld: 7.3 % — ABNORMAL HIGH (ref 4.6–6.5)

## 2022-08-16 MED ORDER — FREESTYLE LIBRE 2 READER DEVI: 6 refills | Status: DC

## 2022-08-16 MED ORDER — FREESTYLE LIBRE 3 SENSOR MISC: 6 refills | Status: DC

## 2022-08-16 MED ORDER — TIRZEPATIDE 2.5 MG/0.5ML ~~LOC~~ SOAJ: 2.5000 mg | SUBCUTANEOUS | 1 refills | Status: DC

## 2022-08-16 MED ORDER — FREESTYLE LIBRE 14 DAY READER DEVI: 6 refills | Status: DC

## 2022-08-16 MED ORDER — FREESTYLE LIBRE 2 SENSOR MISC: 6 refills | Status: DC

## 2022-08-16 NOTE — Telephone Encounter (Signed)
Alternative CGM recommended per pharmacy. Dexcom, Freestyle 2s

## 2022-08-20 DIAGNOSIS — H57813 Brow ptosis, bilateral: Secondary | ICD-10-CM | POA: Diagnosis not present

## 2022-08-20 DIAGNOSIS — E119 Type 2 diabetes mellitus without complications: Secondary | ICD-10-CM | POA: Diagnosis not present

## 2022-08-20 DIAGNOSIS — H2513 Age-related nuclear cataract, bilateral: Secondary | ICD-10-CM | POA: Diagnosis not present

## 2022-08-20 DIAGNOSIS — H524 Presbyopia: Secondary | ICD-10-CM | POA: Diagnosis not present

## 2022-08-20 DIAGNOSIS — I10 Essential (primary) hypertension: Secondary | ICD-10-CM | POA: Diagnosis not present

## 2022-08-20 DIAGNOSIS — H02834 Dermatochalasis of left upper eyelid: Secondary | ICD-10-CM | POA: Diagnosis not present

## 2022-08-20 DIAGNOSIS — H5203 Hypermetropia, bilateral: Secondary | ICD-10-CM | POA: Diagnosis not present

## 2022-08-20 DIAGNOSIS — H25013 Cortical age-related cataract, bilateral: Secondary | ICD-10-CM | POA: Diagnosis not present

## 2022-08-20 DIAGNOSIS — Z7984 Long term (current) use of oral hypoglycemic drugs: Secondary | ICD-10-CM | POA: Diagnosis not present

## 2022-08-20 DIAGNOSIS — H52203 Unspecified astigmatism, bilateral: Secondary | ICD-10-CM | POA: Diagnosis not present

## 2022-08-20 DIAGNOSIS — H02831 Dermatochalasis of right upper eyelid: Secondary | ICD-10-CM | POA: Diagnosis not present

## 2022-08-20 LAB — HM DIABETES EYE EXAM

## 2022-08-24 DIAGNOSIS — I10 Essential (primary) hypertension: Secondary | ICD-10-CM | POA: Diagnosis not present

## 2022-08-24 DIAGNOSIS — E119 Type 2 diabetes mellitus without complications: Secondary | ICD-10-CM | POA: Diagnosis not present

## 2022-08-30 DIAGNOSIS — Z8616 Personal history of COVID-19: Secondary | ICD-10-CM | POA: Diagnosis not present

## 2022-08-30 DIAGNOSIS — Z7984 Long term (current) use of oral hypoglycemic drugs: Secondary | ICD-10-CM | POA: Diagnosis not present

## 2022-08-30 DIAGNOSIS — Z951 Presence of aortocoronary bypass graft: Secondary | ICD-10-CM | POA: Diagnosis not present

## 2022-08-30 DIAGNOSIS — Z888 Allergy status to other drugs, medicaments and biological substances status: Secondary | ICD-10-CM | POA: Diagnosis not present

## 2022-08-30 DIAGNOSIS — Z87891 Personal history of nicotine dependence: Secondary | ICD-10-CM | POA: Diagnosis not present

## 2022-08-30 DIAGNOSIS — I2510 Atherosclerotic heart disease of native coronary artery without angina pectoris: Secondary | ICD-10-CM | POA: Diagnosis not present

## 2022-08-30 DIAGNOSIS — E1151 Type 2 diabetes mellitus with diabetic peripheral angiopathy without gangrene: Secondary | ICD-10-CM | POA: Diagnosis not present

## 2022-08-30 DIAGNOSIS — N138 Other obstructive and reflux uropathy: Secondary | ICD-10-CM | POA: Diagnosis not present

## 2022-08-30 DIAGNOSIS — E119 Type 2 diabetes mellitus without complications: Secondary | ICD-10-CM | POA: Diagnosis not present

## 2022-08-30 DIAGNOSIS — K219 Gastro-esophageal reflux disease without esophagitis: Secondary | ICD-10-CM | POA: Diagnosis not present

## 2022-08-30 DIAGNOSIS — E785 Hyperlipidemia, unspecified: Secondary | ICD-10-CM | POA: Diagnosis not present

## 2022-08-30 DIAGNOSIS — Z79899 Other long term (current) drug therapy: Secondary | ICD-10-CM | POA: Diagnosis not present

## 2022-08-30 DIAGNOSIS — I10 Essential (primary) hypertension: Secondary | ICD-10-CM | POA: Diagnosis not present

## 2022-08-30 DIAGNOSIS — G4730 Sleep apnea, unspecified: Secondary | ICD-10-CM | POA: Diagnosis not present

## 2022-08-31 DIAGNOSIS — Z87891 Personal history of nicotine dependence: Secondary | ICD-10-CM | POA: Diagnosis not present

## 2022-08-31 DIAGNOSIS — I10 Essential (primary) hypertension: Secondary | ICD-10-CM | POA: Diagnosis not present

## 2022-08-31 DIAGNOSIS — G4730 Sleep apnea, unspecified: Secondary | ICD-10-CM | POA: Diagnosis not present

## 2022-08-31 DIAGNOSIS — Z79899 Other long term (current) drug therapy: Secondary | ICD-10-CM | POA: Diagnosis not present

## 2022-08-31 DIAGNOSIS — E785 Hyperlipidemia, unspecified: Secondary | ICD-10-CM | POA: Diagnosis not present

## 2022-08-31 DIAGNOSIS — N138 Other obstructive and reflux uropathy: Secondary | ICD-10-CM | POA: Diagnosis not present

## 2022-08-31 DIAGNOSIS — I2510 Atherosclerotic heart disease of native coronary artery without angina pectoris: Secondary | ICD-10-CM | POA: Diagnosis not present

## 2022-08-31 DIAGNOSIS — E1151 Type 2 diabetes mellitus with diabetic peripheral angiopathy without gangrene: Secondary | ICD-10-CM | POA: Diagnosis not present

## 2022-08-31 DIAGNOSIS — Z888 Allergy status to other drugs, medicaments and biological substances status: Secondary | ICD-10-CM | POA: Diagnosis not present

## 2022-08-31 DIAGNOSIS — Z7984 Long term (current) use of oral hypoglycemic drugs: Secondary | ICD-10-CM | POA: Diagnosis not present

## 2022-09-02 NOTE — Progress Notes (Signed)
[name] at [facility], [place], [place] [address] [phone]  Date:  09/06/2022   Name:  David Singh   DOB:  [birth year]   MRN:  [id number]  PCP:  Darreld Mclean, MD    Chief Complaint: Post-op OV (urologist suggests follow up with pcp after prostrate surgery (08/30/22)/Concerns/ questions: Mounjaro- when can he resume injections? )   History of Present Illness:  David Singh is a 71 y.o. very pleasant male patient who presents with the following:  Pt seen today for a follow-up after recent prostate surgery He had a TURP earlier this month per Dr Henreitta Leber- notes he still sees some pink in his urine, he is not sure if his urinary hesitance is better as of yet or not Last seen by myself in October   History of diabetes, CAD with stent, hyperlipidemia, hypertension, significant peripheral arterial disease He notes his PAD is stable.  He can walk about the same distance before he gets pain  Covid booster- pt declines Shingrix- recommended  Eye exam - this was done last month per his report   He wonders when he can re-start Mounjaro; he stopped this the week prior to his surgery, he had just started it a week or so prior  He notes his blood sugars have been stable Lab Results  Component Value Date   HGBA1C 7.3 (H) 08/16/2022     Patient Active Problem List   Diagnosis Date Noted   Primary osteoarthritis of right knee 03/03/2020   Chronic gout 03/09/2016   Hypertriglyceridemia 01/16/2015   Type 2 diabetes mellitus ([place]) 01/06/2015   CAD in native artery 01/06/2015   History of coronary artery stent placement 01/06/2015   PAD (peripheral artery disease) ([place]) 01/06/2015   Hyperlipidemia 01/06/2015   Essential hypertension 01/06/2015    Past Medical History:  Diagnosis Date   CAD (coronary artery disease)    Cancer ([place])    basal cell excision upper right ear 7 yrs ago    Complication of anesthesia    bad pain with anesthesia induction with colonscopy 6 yrs ago   Diabetes mellitus without complication ([place])    type 2   GERD (gastroesophageal reflux disease)    Hyperlipidemia    Hypertension    Peripheral vascular disease ([place])    partial clogged artery right leg   Sleep apnea    yrs ago sleep study no cpap used    Past Surgical History:  Procedure Laterality Date   ABDOMINAL AORTOGRAM W/LOWER EXTREMITY N/A 12/27/2016   Procedure: Abdominal Aortogram w/Lower Extremity;  Surgeon: Angelia Mould, MD;  Location: [facility];  Service: Cardiovascular;  Laterality: N/A;   CARDIAC SURGERY     CORONARY ANGIOPLASTY WITH STENT PLACEMENT  11/2004   in [place]    NASAL SINUS SURGERY  1976   TONSILLECTOMY      Social History   Tobacco Use   Smoking status: Former    Packs/day: 1.50    Years: 26.00    Total pack years: 39.00    Types: Cigarettes    Quit date: 1994    Years since quitting: 29.8   Smokeless tobacco: Former  Scientific laboratory technician Use: Never used  Substance Use Topics   Alcohol use: Yes    Alcohol/week: 0.0 standard drinks of alcohol    Comment: 3 ounces on monday   Drug use: No  Family History  Problem Relation Age of Onset   Alcohol abuse Brother    Cancer Brother    Diabetes Brother    Hyperlipidemia Brother    Hypertension Brother    Diabetes Mother    Hypertension Mother    Hyperlipidemia Mother    Heart attack Mother    Hyperlipidemia Father    Hypertension Father     Allergies  Allergen Reactions   Dexilant [Dexlansoprazole]     Pressure in stomach and chest    Medication list has been reviewed and updated.  Current Outpatient Medications on File Prior to Visit  Medication Sig Dispense Refill   amLODipine (NORVASC) 10 MG tablet Take 1 tablet (10 mg total) by mouth daily. 90 tablet 3   aspirin 81 MG tablet Take 81 mg by mouth daily.     busPIRone (BUSPAR) 15 MG tablet Take  1 tablet (15 mg total) by mouth 2 (two) times daily. 180 tablet 1   carvedilol (COREG) 25 MG tablet Take 1 tablet (25 mg total) by mouth 2 (two) times daily. 180 tablet 3   Cholecalciferol (VITAMIN D3) 2000 units TABS Take 2,000 Units by mouth daily.     cloNIDine (CATAPRES) 0.2 MG tablet TAKE 1 TABLET BY MOUTH TWICE  DAILY 200 tablet 2   Coenzyme Q10 300 MG CAPS Take 300 mg by mouth daily.      Continuous Blood Gluc Receiver (FREESTYLE LIBRE 2 READER) DEVI Use for continuous blood glucose monitoring.  Patient is having symptoms of hypoglycemia 2 each 6   Continuous Blood Gluc Sensor (FREESTYLE LIBRE 2 SENSOR) MISC 1 Device by Does not apply route daily. 2 each 6   Continuous Blood Gluc Sensor (FREESTYLE LIBRE 2 SENSOR) MISC Use for continuous blood glucose monitoring.  Patient is having symptoms of hypoglycemia 2 each 6   Continuous Blood Gluc Sensor (FREESTYLE LIBRE 3 SENSOR) MISC Place 1 sensor on the skin every 14 days. Use to check glucose continuously 2 each 6   Cyanocobalamin (VITAMIN B-12 PO) Take 2,500 mg by mouth daily.     diphenoxylate-atropine (LOMOTIL) 2.5-0.025 MG tablet Take 1 tablet by mouth every 6 (six) hours as needed for diarrhea or loose stools. 60 tablet 1   glucose blood (FREESTYLE LITE) test strip Check blood sugar 1-3x daily 100 each 12   Lancets (FREESTYLE) lancets Check blood sugar once daily 100 each 12   lisinopril (ZESTRIL) 40 MG tablet TAKE 1.5 TABLETS BY MOUTH DAILY. 135 tablet 3   meloxicam (MOBIC) 15 MG tablet Take 1 tablet (15 mg total) by mouth daily as needed for pain. 90 tablet 1   metFORMIN (GLUCOPHAGE) 1000 MG tablet TAKE 1 TABLET BY MOUTH TWICE  DAILY WITH MEALS 180 tablet 3   MULTIPLE VITAMIN PO Take 1 tablet by mouth daily.      niacin 500 MG tablet Take 500 mg by mouth every morning.      pantoprazole (PROTONIX) 40 MG tablet Take 1 tablet (40 mg total) by mouth daily. 90 tablet 3   rosuvastatin (CRESTOR) 40 MG tablet TAKE 1 TABLET BY MOUTH EVERY DAY 90  tablet 1   spironolactone (ALDACTONE) 25 MG tablet TAKE 1 TABLET BY MOUTH DAILY 30 tablet 11   tamsulosin (FLOMAX) 0.4 MG CAPS capsule TAKE 1 CAPSULE EVERY DAY MAY INCREASE TO 2 AFTER 2 WEEKS IF NEED FOR PROSTATE SYMPTOMS 180 capsule 1   tirzepatide (MOUNJARO) 2.5 MG/0.5ML Pen Inject 2.5 mg into the skin once a week. 2 mL 1  No current facility-administered medications on file prior to visit.    Review of Systems:  As per HPI- otherwise negative.   Physical Examination: Vitals:   09/06/22 0834  BP: 124/70  Pulse: 77  Resp: 18  Temp: 97.7 F (36.5 C)  SpO2: 97%   Vitals:   09/06/22 0834  Weight: 265 lb (120.2 kg)  Height: '5\' 9"'$  (1.753 m)   Body mass index is 39.13 kg/m. Ideal Body Weight: Weight in (lb) to have BMI = 25: 168.9  GEN: no acute distress.  Obese, looks well  HEENT: Atraumatic, Normocephalic.  Ears and Nose: No external deformity. CV: RRR, No M/G/R. No JVD. No thrill. No extra heart sounds. PULM: CTA B, no wheezes, crackles, rhonchi. No retractions. No resp. distress. No accessory muscle use. ABD: S, NT, ND. No rebound. No HSM. EXTR: No c/c/e PSYCH: Normally interactive. Conversant.    Assessment and Plan: Benign prostatic hyperplasia with incomplete bladder emptying  PAD (peripheral artery disease) (HCC)  Essential hypertension  Type 2 diabetes mellitus with other circulatory complication, without long-term current use of insulin (HCC) Following up today Seems to be recovering normally from BPH surgery per urology He wonders when he can go back on [facility]- advised him to start back in the next 1-2 weeks assuming he is feeling well Otherwise plan to visit in 3-4 months to recheck A1c   Signed Lamar Blinks, MD

## 2022-09-06 ENCOUNTER — Ambulatory Visit (INDEPENDENT_AMBULATORY_CARE_PROVIDER_SITE_OTHER): Payer: Medicare Other | Admitting: Family Medicine

## 2022-09-06 VITALS — BP 124/70 | HR 77 | Temp 97.7°F | Resp 18 | Ht 69.0 in | Wt 265.0 lb

## 2022-09-06 DIAGNOSIS — R3914 Feeling of incomplete bladder emptying: Secondary | ICD-10-CM | POA: Diagnosis not present

## 2022-09-06 DIAGNOSIS — E1159 Type 2 diabetes mellitus with other circulatory complications: Secondary | ICD-10-CM

## 2022-09-06 DIAGNOSIS — N401 Enlarged prostate with lower urinary tract symptoms: Secondary | ICD-10-CM

## 2022-09-06 DIAGNOSIS — I739 Peripheral vascular disease, unspecified: Secondary | ICD-10-CM | POA: Diagnosis not present

## 2022-09-06 DIAGNOSIS — I10 Essential (primary) hypertension: Secondary | ICD-10-CM

## 2022-09-06 NOTE — Patient Instructions (Addendum)
It was good to see you today- I think good to start back on the [facility] in the next week or two as you feel comfortable  Please let me know if any concerns  Assuming all is well please see me in 3-4 months for a recheck and diabetes follow-up

## 2022-09-13 ENCOUNTER — Encounter: Payer: Self-pay | Admitting: Family Medicine

## 2022-09-21 ENCOUNTER — Encounter: Payer: Self-pay | Admitting: *Deleted

## 2022-10-05 DIAGNOSIS — N138 Other obstructive and reflux uropathy: Secondary | ICD-10-CM | POA: Diagnosis not present

## 2022-10-05 DIAGNOSIS — R300 Dysuria: Secondary | ICD-10-CM | POA: Diagnosis not present

## 2022-10-06 DIAGNOSIS — R8290 Unspecified abnormal findings in urine: Secondary | ICD-10-CM | POA: Diagnosis not present

## 2022-10-07 ENCOUNTER — Other Ambulatory Visit: Payer: Self-pay | Admitting: Family Medicine

## 2022-10-07 DIAGNOSIS — K219 Gastro-esophageal reflux disease without esophagitis: Secondary | ICD-10-CM

## 2022-10-07 DIAGNOSIS — M1711 Unilateral primary osteoarthritis, right knee: Secondary | ICD-10-CM | POA: Diagnosis not present

## 2022-10-07 DIAGNOSIS — M7542 Impingement syndrome of left shoulder: Secondary | ICD-10-CM | POA: Diagnosis not present

## 2022-10-09 ENCOUNTER — Other Ambulatory Visit: Payer: Self-pay | Admitting: Family Medicine

## 2022-10-09 DIAGNOSIS — E1159 Type 2 diabetes mellitus with other circulatory complications: Secondary | ICD-10-CM

## 2022-10-11 ENCOUNTER — Encounter: Payer: Self-pay | Admitting: Family Medicine

## 2022-10-11 DIAGNOSIS — E1159 Type 2 diabetes mellitus with other circulatory complications: Secondary | ICD-10-CM

## 2022-10-11 MED ORDER — TIRZEPATIDE 2.5 MG/0.5ML ~~LOC~~ SOAJ: 2.5000 mg | SUBCUTANEOUS | 1 refills | Status: DC

## 2022-10-11 NOTE — Telephone Encounter (Signed)
Called [facility] and their internet is down, says to call back in about 15-20 mins so they can get into the system to fax the required forms.

## 2022-10-11 NOTE — Telephone Encounter (Signed)
Would you like for Pt to increase to '5mg'$  ?

## 2022-10-19 NOTE — Telephone Encounter (Signed)
Called them back and all they needed was the last OV notes, this has been faxed to the number provided.

## 2022-10-22 ENCOUNTER — Other Ambulatory Visit: Payer: Self-pay | Admitting: Family Medicine

## 2022-11-10 ENCOUNTER — Encounter (INDEPENDENT_AMBULATORY_CARE_PROVIDER_SITE_OTHER): Payer: Medicare Other | Admitting: Family Medicine

## 2022-11-10 ENCOUNTER — Telehealth: Payer: Medicare Other

## 2022-11-10 DIAGNOSIS — I10 Essential (primary) hypertension: Secondary | ICD-10-CM

## 2022-11-10 MED ORDER — LISINOPRIL 10 MG PO TABS: 10.0000 mg | ORAL_TABLET | Freq: Every day | ORAL | 3 refills | Status: DC

## 2022-11-10 NOTE — Telephone Encounter (Signed)
Please see the MyChart message reply(ies) for my assessment and plan.  The patient gave consent for this Medical Advice Message and is aware that it may result in a bill to their insurance company as well as the possibility that this may result in a co-payment or deductible. They are an established patient, but are not seeking medical advice exclusively about a problem treated during an in person or video visit in the last 7 days. I did not recommend an in person or video visit within 7 days of my reply.  I spent a total of 12 minutes cumulative time within 7 days through [facility] messaging Lamar Blinks, MD

## 2022-11-10 NOTE — Addendum Note (Signed)
Addended by: Lamar Blinks C on: 11/10/2022 08:54 PM   Modules accepted: Orders

## 2022-11-30 ENCOUNTER — Other Ambulatory Visit: Payer: Self-pay | Admitting: Family Medicine

## 2022-11-30 DIAGNOSIS — I10 Essential (primary) hypertension: Secondary | ICD-10-CM

## 2022-12-10 NOTE — Patient Instructions (Incomplete)
I will be in touch with your labs  Recommend the shingles vaccine at your pharmacy if not done already  Your BP looks good- if your average BP is running lower than 120/75 we will want to decrease your BP med.  You can stop using the lisinopril in that case

## 2022-12-10 NOTE — Progress Notes (Unsigned)
[place] at [facility] [address], [place], [place] [address] [phone]  Date:  12/13/2022   Name:  David Singh   DOB:  1951-09-19   MRN:  [id number]  PCP:  Darreld Mclean, MD    Chief Complaint: No chief complaint on file.   History of Present Illness:  David Singh is a 72 y.o. very pleasant male patient who presents with the following:  Patient seen today for periodic follow-up Most recent visit with myself was in November-at that time he had recently undergone TURP procedure History of diabetes, CAD with stent, hyperlipidemia, hypertension, significant peripheral arterial disease He notes his PAD is stable.  He can walk about the same distance before he gets pain He is using Mounjaro for diabetes control and weight loss  He sent me a message with concern about elevated blood pressure last month-we had him start back on a low-dose of lisinopril  Can update A1c today He has refused COVID-19 vaccination Recommend Shingrix at pharmacy  Patient Active Problem List   Diagnosis Date Noted   Primary osteoarthritis of right knee 03/03/2020   Chronic gout 03/09/2016   Hypertriglyceridemia 01/16/2015   Type 2 diabetes mellitus ([place]) 01/06/2015   CAD in native artery 01/06/2015   History of coronary artery stent placement 01/06/2015   PAD (peripheral artery disease) ([place]) 01/06/2015   Hyperlipidemia 01/06/2015   Essential hypertension 01/06/2015    Past Medical History:  Diagnosis Date   CAD (coronary artery disease)    Cancer ([place])    basal cell excision upper right ear 7 yrs ago   Complication of anesthesia    bad pain with anesthesia induction with colonscopy 6 yrs ago   Diabetes mellitus without complication (HCC)    type 2   GERD (gastroesophageal reflux disease)    Hyperlipidemia    Hypertension    Peripheral vascular disease ([place])    partial clogged artery right leg   Sleep apnea     yrs ago sleep study no cpap used    Past Surgical History:  Procedure Laterality Date   ABDOMINAL AORTOGRAM W/LOWER EXTREMITY N/A 12/27/2016   Procedure: Abdominal Aortogram w/Lower Extremity;  Surgeon: Angelia Mould, MD;  Location: [facility];  Service: Cardiovascular;  Laterality: N/A;   CARDIAC SURGERY     CORONARY ANGIOPLASTY WITH STENT PLACEMENT  11/2004   in [place] Right    NASAL SINUS SURGERY  1976   TONSILLECTOMY      Social History   Tobacco Use   Smoking status: Former    Packs/day: 1.50    Years: 26.00    Total pack years: 39.00    Types: Cigarettes    Quit date: 1994    Years since quitting: 30.1   Smokeless tobacco: Former  Scientific laboratory technician Use: Never used  Substance Use Topics   Alcohol use: Yes    Alcohol/week: 0.0 standard drinks of alcohol    Comment: 3 ounces on monday   Drug use: No    Family History  Problem Relation Age of Onset   Alcohol abuse Brother    Cancer Brother    Diabetes Brother    Hyperlipidemia Brother    Hypertension Brother    Diabetes Mother    Hypertension Mother    Hyperlipidemia Mother    Heart attack Mother    Hyperlipidemia Father    Hypertension Father  Allergies  Allergen Reactions   Dexilant [Dexlansoprazole]     Pressure in stomach and chest    Medication list has been reviewed and updated.  Current Outpatient Medications on File Prior to Visit  Medication Sig Dispense Refill   amLODipine (NORVASC) 10 MG tablet TAKE 1 TABLET BY MOUTH DAILY 100 tablet 2   aspirin 81 MG tablet Take 81 mg by mouth daily.     busPIRone (BUSPAR) 15 MG tablet Take 1 tablet (15 mg total) by mouth 2 (two) times daily. 180 tablet 1   carvedilol (COREG) 25 MG tablet Take 1 tablet (25 mg total) by mouth 2 (two) times daily. 180 tablet 3   Cholecalciferol (VITAMIN D3) 2000 units TABS Take 2,000 Units by mouth daily.     cloNIDine (CATAPRES) 0.2 MG tablet TAKE 1 TABLET BY MOUTH TWICE  DAILY 200  tablet 2   Coenzyme Q10 300 MG CAPS Take 300 mg by mouth daily.      Continuous Blood Gluc Receiver (FREESTYLE LIBRE 2 READER) DEVI Use for continuous blood glucose monitoring.  Patient is having symptoms of hypoglycemia 2 each 6   Continuous Blood Gluc Sensor (FREESTYLE LIBRE 2 SENSOR) MISC 1 Device by Does not apply route daily. 2 each 6   Continuous Blood Gluc Sensor (FREESTYLE LIBRE 2 SENSOR) MISC Use for continuous blood glucose monitoring.  Patient is having symptoms of hypoglycemia 2 each 6   Continuous Blood Gluc Sensor (FREESTYLE LIBRE 3 SENSOR) MISC Place 1 sensor on the skin every 14 days. Use to check glucose continuously 2 each 6   Cyanocobalamin (VITAMIN B-12 PO) Take 2,500 mg by mouth daily.     diphenoxylate-atropine (LOMOTIL) 2.5-0.025 MG tablet Take 1 tablet by mouth every 6 (six) hours as needed for diarrhea or loose stools. 60 tablet 1   glucose blood (FREESTYLE LITE) test strip Check blood sugar 1-3x daily 100 each 12   Lancets (FREESTYLE) lancets Check blood sugar once daily 100 each 12   lisinopril (ZESTRIL) 10 MG tablet Take 1-2 tablets (10-20 mg total) by mouth daily. 60 tablet 3   meloxicam (MOBIC) 15 MG tablet Take 1 tablet (15 mg total) by mouth daily as needed for pain. 90 tablet 1   metFORMIN (GLUCOPHAGE) 1000 MG tablet TAKE 1 TABLET BY MOUTH TWICE  DAILY WITH MEALS 180 tablet 3   MULTIPLE VITAMIN PO Take 1 tablet by mouth daily.      niacin 500 MG tablet Take 500 mg by mouth every morning.      pantoprazole (PROTONIX) 40 MG tablet Take 1 tablet (40 mg total) by mouth daily. 90 tablet 1   rosuvastatin (CRESTOR) 40 MG tablet TAKE 1 TABLET BY MOUTH DAILY 100 tablet 2   spironolactone (ALDACTONE) 25 MG tablet TAKE 1 TABLET BY MOUTH DAILY 30 tablet 11   tamsulosin (FLOMAX) 0.4 MG CAPS capsule TAKE 1 CAPSULE EVERY DAY MAY INCREASE TO 2 AFTER 2 WEEKS IF NEED FOR PROSTATE SYMPTOMS 180 capsule 1   tirzepatide (MOUNJARO) 2.5 MG/0.5ML Pen INJECT 2.5 MG SUBCUTANEOUSLY WEEKLY 2  mL 2   tirzepatide (MOUNJARO) 2.5 MG/0.5ML Pen Inject 2.5 mg into the skin once a week. 2 mL 1   No current facility-administered medications on file prior to visit.    Review of Systems:  As per HPI- otherwise negative.   Physical Examination: There were no vitals filed for this visit. There were no vitals filed for this visit. There is no height or weight on file to calculate BMI. Ideal  Body Weight:    GEN: no acute distress. HEENT: Atraumatic, Normocephalic.  Ears and Nose: No external deformity. CV: RRR, No M/G/R. No JVD. No thrill. No extra heart sounds. PULM: CTA B, no wheezes, crackles, rhonchi. No retractions. No resp. distress. No accessory muscle use. ABD: S, NT, ND, +BS. No rebound. No HSM. EXTR: No c/c/e PSYCH: Normally interactive. Conversant.    Assessment and Plan: ***  Signed Lamar Blinks, MD

## 2022-12-13 ENCOUNTER — Encounter: Payer: Self-pay | Admitting: Family Medicine

## 2022-12-13 ENCOUNTER — Ambulatory Visit (INDEPENDENT_AMBULATORY_CARE_PROVIDER_SITE_OTHER): Payer: Medicare Other | Admitting: Family Medicine

## 2022-12-13 VITALS — BP 124/76 | HR 67 | Resp 19 | Ht 69.0 in | Wt 256.0 lb

## 2022-12-13 DIAGNOSIS — E785 Hyperlipidemia, unspecified: Secondary | ICD-10-CM

## 2022-12-13 DIAGNOSIS — D649 Anemia, unspecified: Secondary | ICD-10-CM | POA: Diagnosis not present

## 2022-12-13 DIAGNOSIS — E1159 Type 2 diabetes mellitus with other circulatory complications: Secondary | ICD-10-CM

## 2022-12-13 DIAGNOSIS — I10 Essential (primary) hypertension: Secondary | ICD-10-CM | POA: Diagnosis not present

## 2022-12-13 DIAGNOSIS — I739 Peripheral vascular disease, unspecified: Secondary | ICD-10-CM

## 2022-12-13 LAB — BASIC METABOLIC PANEL
BUN: 15 mg/dL (ref 6–23)
CO2: 26 mEq/L (ref 19–32)
Calcium: 9.5 mg/dL (ref 8.4–10.5)
Chloride: 101 mEq/L (ref 96–112)
Creatinine, Ser: 1.03 mg/dL (ref 0.40–1.50)
GFR: 73.35 mL/min (ref >60.00)
Glucose, Bld: 122 mg/dL — ABNORMAL HIGH (ref 70–99)
Potassium: 4.4 mEq/L (ref 3.5–5.1)
Sodium: 134 mEq/L — ABNORMAL LOW (ref 135–145)

## 2022-12-13 LAB — HEMOGLOBIN A1C: Hgb A1c MFr Bld: 7.3 % — ABNORMAL HIGH (ref 4.6–6.5)

## 2022-12-15 ENCOUNTER — Encounter: Payer: Self-pay | Admitting: Cardiology

## 2022-12-16 ENCOUNTER — Other Ambulatory Visit: Payer: Self-pay | Admitting: Family Medicine

## 2022-12-16 DIAGNOSIS — K219 Gastro-esophageal reflux disease without esophagitis: Secondary | ICD-10-CM

## 2023-01-11 ENCOUNTER — Other Ambulatory Visit: Payer: Self-pay | Admitting: Family Medicine

## 2023-01-11 DIAGNOSIS — E119 Type 2 diabetes mellitus without complications: Secondary | ICD-10-CM

## 2023-01-17 DIAGNOSIS — H6121 Impacted cerumen, right ear: Secondary | ICD-10-CM | POA: Diagnosis not present

## 2023-01-17 DIAGNOSIS — M791 Myalgia, unspecified site: Secondary | ICD-10-CM | POA: Diagnosis not present

## 2023-01-18 DIAGNOSIS — H60391 Other infective otitis externa, right ear: Secondary | ICD-10-CM | POA: Diagnosis not present

## 2023-01-19 ENCOUNTER — Emergency Department (HOSPITAL_BASED_OUTPATIENT_CLINIC_OR_DEPARTMENT_OTHER): Payer: Medicare Other

## 2023-01-19 ENCOUNTER — Encounter (HOSPITAL_BASED_OUTPATIENT_CLINIC_OR_DEPARTMENT_OTHER): Payer: Self-pay | Admitting: Emergency Medicine

## 2023-01-19 ENCOUNTER — Encounter: Payer: Self-pay | Admitting: Medical

## 2023-01-19 ENCOUNTER — Telehealth (INDEPENDENT_AMBULATORY_CARE_PROVIDER_SITE_OTHER): Payer: Medicare Other | Admitting: Medical

## 2023-01-19 ENCOUNTER — Emergency Department (HOSPITAL_BASED_OUTPATIENT_CLINIC_OR_DEPARTMENT_OTHER)
Admission: EM | Admit: 2023-01-19 | Discharge: 2023-01-19 | Disposition: A | Discharge status: Home / Self Care | Payer: Medicare Other | Attending: Emergency Medicine | Admitting: Emergency Medicine

## 2023-01-19 VITALS — BP 125/60 | HR 101

## 2023-01-19 DIAGNOSIS — R42 Dizziness and giddiness: Secondary | ICD-10-CM | POA: Insufficient documentation

## 2023-01-19 DIAGNOSIS — H6091 Unspecified otitis externa, right ear: Secondary | ICD-10-CM | POA: Diagnosis not present

## 2023-01-19 DIAGNOSIS — H60501 Unspecified acute noninfective otitis externa, right ear: Secondary | ICD-10-CM

## 2023-01-19 DIAGNOSIS — R000 Tachycardia, unspecified: Secondary | ICD-10-CM | POA: Diagnosis not present

## 2023-01-19 DIAGNOSIS — E1159 Type 2 diabetes mellitus with other circulatory complications: Secondary | ICD-10-CM | POA: Diagnosis not present

## 2023-01-19 DIAGNOSIS — H9201 Otalgia, right ear: Secondary | ICD-10-CM | POA: Insufficient documentation

## 2023-01-19 DIAGNOSIS — R519 Headache, unspecified: Secondary | ICD-10-CM | POA: Diagnosis not present

## 2023-01-19 LAB — CBC WITH DIFFERENTIAL/PLATELET
Abs Immature Granulocytes: 0.05 10*3/uL (ref 0.00–0.07)
Basophils Absolute: 0 10*3/uL (ref 0.0–0.1)
Basophils Relative: 0 %
Eosinophils Absolute: 0.1 10*3/uL (ref 0.0–0.5)
Eosinophils Relative: 1 %
HCT: 33.2 % — ABNORMAL LOW (ref 39.0–52.0)
Hemoglobin: 11.1 g/dL — ABNORMAL LOW (ref 13.0–17.0)
Immature Granulocytes: 1 %
Lymphocytes Relative: 6 %
Lymphs Abs: 0.6 10*3/uL — ABNORMAL LOW (ref 0.7–4.0)
MCH: 26.7 pg (ref 26.0–34.0)
MCHC: 33.4 g/dL (ref 30.0–36.0)
MCV: 80 fL (ref 80.0–100.0)
Monocytes Absolute: 1 10*3/uL (ref 0.1–1.0)
Monocytes Relative: 11 %
Neutro Abs: 7.1 10*3/uL (ref 1.7–7.7)
Neutrophils Relative %: 81 %
Platelets: 212 10*3/uL (ref 150–400)
RBC: 4.15 MIL/uL — ABNORMAL LOW (ref 4.22–5.81)
RDW: 14.2 % (ref 11.5–15.5)
WBC: 8.8 10*3/uL (ref 4.0–10.5)
nRBC: 0 % (ref 0.0–0.2)

## 2023-01-19 LAB — COMPREHENSIVE METABOLIC PANEL
ALT: 30 U/L (ref 0–44)
AST: 31 U/L (ref 15–41)
Albumin: 3.3 g/dL — ABNORMAL LOW (ref 3.5–5.0)
Alkaline Phosphatase: 58 U/L (ref 38–126)
Anion gap: 10 (ref 5–15)
BUN: 16 mg/dL (ref 8–23)
CO2: 22 mmol/L (ref 22–32)
Calcium: 8.5 mg/dL — ABNORMAL LOW (ref 8.9–10.3)
Chloride: 94 mmol/L — ABNORMAL LOW (ref 98–111)
Creatinine, Ser: 1.1 mg/dL (ref 0.61–1.24)
GFR, Estimated: >60 mL/min (ref >60)
Glucose, Bld: 147 mg/dL — ABNORMAL HIGH (ref 70–99)
Potassium: 4.1 mmol/L (ref 3.5–5.1)
Sodium: 126 mmol/L — ABNORMAL LOW (ref 135–145)
Total Bilirubin: 0.9 mg/dL (ref 0.3–1.2)
Total Protein: 7.4 g/dL (ref 6.5–8.1)

## 2023-01-19 MED ORDER — MECLIZINE HCL 25 MG PO TABS
25.0000 mg | ORAL_TABLET | Freq: Once | ORAL | Status: AC
  Administered 2023-01-19: 25 mg via ORAL
  Filled 2023-01-19: qty 1

## 2023-01-19 MED ORDER — IOHEXOL 300 MG/ML  SOLN
100.0000 mL | Freq: Once | INTRAMUSCULAR | Status: AC | PRN
  Administered 2023-01-19: 100 mL via INTRAVENOUS

## 2023-01-19 MED ORDER — SODIUM CHLORIDE 0.9 % IV SOLN
1.0000 g | Freq: Once | INTRAVENOUS | Status: AC
  Administered 2023-01-19: 1 g via INTRAVENOUS
  Filled 2023-01-19: qty 10

## 2023-01-19 MED ORDER — CIPROFLOXACIN-DEXAMETHASONE 0.3-0.1 % OT SUSP: 4.0000 [drp] | Freq: Two times a day (BID) | OTIC | 0 refills | Status: DC

## 2023-01-19 MED ORDER — CIPROFLOXACIN-DEXAMETHASONE 0.3-0.1 % OT SUSP
4.0000 [drp] | Freq: Once | OTIC | Status: AC
  Administered 2023-01-19: 4 [drp] via OTIC
  Filled 2023-01-19: qty 7.5

## 2023-01-19 MED ORDER — VANCOMYCIN HCL IN DEXTROSE 1-5 GM/200ML-% IV SOLN
1000.0000 mg | Freq: Once | INTRAVENOUS | Status: AC
  Administered 2023-01-19: 1000 mg via INTRAVENOUS
  Filled 2023-01-19: qty 200

## 2023-01-19 MED ORDER — SODIUM CHLORIDE 0.9 % IV SOLN: INTRAVENOUS | Status: DC

## 2023-01-19 MED ORDER — CIPROFLOXACIN HCL 500 MG PO TABS: 500.0000 mg | ORAL_TABLET | Freq: Two times a day (BID) | ORAL | 0 refills | Status: AC

## 2023-01-19 MED ORDER — ONDANSETRON HCL 4 MG/2ML IJ SOLN
4.0000 mg | Freq: Once | INTRAMUSCULAR | Status: AC
  Administered 2023-01-19: 4 mg via INTRAVENOUS
  Filled 2023-01-19: qty 2

## 2023-01-19 MED ORDER — ONDANSETRON 4 MG PO TBDP: 4.0000 mg | ORAL_TABLET | Freq: Three times a day (TID) | ORAL | 1 refills | Status: DC | PRN

## 2023-01-19 MED ORDER — MECLIZINE HCL 25 MG PO TABS: 25.0000 mg | ORAL_TABLET | Freq: Three times a day (TID) | ORAL | 0 refills | Status: DC | PRN

## 2023-01-19 MED ORDER — CIPROFLOXACIN-DEXAMETHASONE 0.3-0.1 % OT SUSP: 4.0000 [drp] | Freq: Four times a day (QID) | OTIC | 0 refills | Status: AC

## 2023-01-19 NOTE — ED Triage Notes (Signed)
Pt sent from PCP to r/o mastoiditis; c/o RT ear pain, mastoid tenderness and vomiting; pain since Sun, vomiting started last pm; also c/o dizziness some time this morning, but cannot pinpoint time

## 2023-01-19 NOTE — Progress Notes (Addendum)
Subjective:    Patient ID: David Singh, male    DOB: 03-Jun-1951, 72 y.o.   MRN: [id number]  HPI   Started out by video but asked to drive to our office. Needed to see the ear and do neurologic exam.   Bs at home 149.  In end after 20 minutes of taking history explained why need to see in office. Pt and wife agreed to come drive to office from [place]. Pt states could not open his eyes without room feeling like spinning. Before coming in if they can get dramamine otc and use one tablet advised to use. May help with dizziness.  Virtual Visit via Video Note  I connected with David Singh on 01/19/23 at 10:40 AM EDT by a video enabled telemedicine application and verified that I am speaking with the correct person using two identifiers.  Location: Patient: home Provider: office   I discussed the limitations of evaluation and management by telemedicine and the availability of in person appointments. The patient expressed understanding and agreed to proceed.  History of Present Illness: Saturday ear felt funny and scratchy. Sunday had rt ear pain. [facility] to [facility] had wax removed and later saw blood in ear. Pt states he went back to second time  on Tuesday urgent care and he states was told  the ear drum was not ruptured. They only gave ear drops but not oral antibiotic.  Pt wears hearing aids and they think this may have played a role in ear pain.    Last night vomited all night with increasing ear pain. Can't open his eyes due ot spinning sensation. No upper or lower extremity motor deficits.   Assessment and Plan at urgent care. 1. Acute infective otitis externa of right ear (Primary) Other orders - neomycin-polymyxin-dexamethasone (MAXITROL) 0.1 % ophthalmic suspension; Place 4 drops twice daily in right ear for seven days., Normal - ibuprofen (ADVIL,MOTRIN) 800 mg tablet; Take one tablet (800 mg dose) by mouth every 8 (eight) hours as needed for up to 10 days.,  Starting Tue 01/18/2023, Until Fri 01/28/2023 at 2359, Normal     Observations/Objective:(in person) General Mental Status- Alert. General Appearance- Not in acute distress.   Skin General: Color- Normal Color. Moisture- Normal Moisture.  Neck Carotid Arteries- Normal color. Moisture- Normal Moisture. No carotid bruits. No JVD.  Chest and Lung Exam Auscultation: Breath Sounds:-Normal.  Cardiovascular Auscultation:Rythm- Regular. Murmurs & Other Heart Sounds:Auscultation of the heart reveals- No Murmurs.  Abdomen Inspection:-Inspeection Normal. Palpation/Percussion:Note:No mass. Palpation and Percussion of the abdomen reveal- Non Tender, Non Distended + BS, no rebound or guarding.   Neurologic Cranial Nerve exam:- CN III-XII intact(No nystagmus), symmetric smile. Drift Test:- No drift. Romberg Exam:- Negative.  Heal to Toe Gait exam:-Normal. Finger to Nose:- Normal/Intact Strength:- 5/5 equal and symmetric strength both upper and lower extremities.   Heent- no sinus pressure. Rt ear no tragal tenderness. Canal clear. Can see tm due to ear drop fluid accumalated/lying against tm. Mastoid is tender to papation. Left side ear canal clear and normal tm.   Assessment and Plan:   Follow Up Instructions:    I discussed the assessment and treatment plan with the patient. The patient was provided an opportunity to ask questions and all were answered. The patient agreed with the plan and demonstrated an understanding of the instructions.   The patient was advised to call back or seek an in-person evaluation if the symptoms worsen or if the condition fails to improve as anticipated.  Mackie Pai, PA-C    Review of Systems  Constitutional:  Positive for fatigue.  HENT:  Positive for ear pain. Negative for postnasal drip, sinus pressure, sinus pain and trouble swallowing.   Respiratory:  Negative for cough, choking, shortness of breath and wheezing.   Cardiovascular:   Negative for chest pain and palpitations.  Gastrointestinal:  Negative for abdominal pain.  Genitourinary:  Negative for difficulty urinating, dysuria and flank pain.  Musculoskeletal:  Negative for back pain, joint swelling and neck stiffness.  Skin:  Negative for rash.  Neurological:  Positive for dizziness. Negative for facial asymmetry, speech difficulty, weakness and light-headedness.       Room spinning.  Hematological:  Negative for adenopathy. Does not bruise/bleed easily.       Objective:   Physical Exam  General Mental Status- Alert. General Appearance- Not in acute distress.   Skin General: Color- Normal Color. Moisture- Normal Moisture.  Neck Carotid Arteries- Normal color. Moisture- Normal Moisture. No carotid bruits. No JVD.  Chest and Lung Exam Auscultation: Breath Sounds:-Normal.  Cardiovascular Auscultation:Rythm- Regular. Murmurs & Other Heart Sounds:Auscultation of the heart reveals- No Murmurs.  Abdomen Inspection:-Inspeection Normal. Palpation/Percussion:Note:No mass. Palpation and Percussion of the abdomen reveal- Non Tender, Non Distended + BS, no rebound or guarding.    Neurologic Cranial Nerve exam:- CN III-XII intact some  nystagmus), symmetric smile. Drift Test:- No drift. Romberg Exam:- unable to test Heal to Toe Gait exam:-Normal. Finger to Nose:- left side missed nose Strength:- 5/5 equal and symmetric strength both upper and lower extremities.       Assessment & Plan:   Patient Instructions  Recent right ear discomfort progressively worsening over past 3 days. On exam rt mastoid tenderness, severe dizziness with vomiting last night.   2 urgent care visit and now 3rd visit. With the above finding and hx of diabetes have concern for mastoiditis. Thinks vest to be evaluated in ED for labs and probable state CT imaging. I did talke with charge nurse and discussed recent presentation.  Walked down stairs to ED with pt ad wife. Went by  wheel chair.  Follow up with our office after ED evaluation and treatment    Mackie Pai, PA-C   Time spent with patient today was  50 minutes(total time first on virtual then more time in office) which consisted of chart review, discussing diagnosis, work up treatment and documentation. Coordinated transfer to ED. Walked down with pt as at time of inoffice visit lunch and CMA not available.)

## 2023-01-19 NOTE — ED Provider Notes (Signed)
[facility] Provider Note   CSN: [id number] Arrival date & time: 01/19/23  1257     History  Chief Complaint  Patient presents with   Emesis    David Singh is a 72 y.o. male.  Patient sent down from upstairs for concerns for right-sided mastoiditis.  Patient on Saturday started with some right ear pain some symptoms started on Friday.'s been being treated with antibiotic eardrops.  They thought it was an external otitis.  Patient not getting any better today started with vertigo type symptoms at about 9 in the morning no other strokelike symptoms no speech problems no real visual problems no weakness to upper extremities or lower extremities or numbness.  The vertigo is true room spinning.  Made worse by movement of his head.  No complaints in the left ear.  Past medical history is significant for hypertension hyperlipidemia gastroesophageal reflux disease coronary artery disease peripheral vascular disease diabetes.  Past surgical history significant for tonsillectomy coronary angioplasty with stents in 2006.  Patient is a former smoker and quit in 1994.       Home Medications Prior to Admission medications   Medication Sig Start Date End Date Taking? Authorizing Provider  amLODipine (NORVASC) 10 MG tablet TAKE 1 TABLET BY MOUTH DAILY 12/01/22   Copland, Gay Filler, MD  aspirin 81 MG tablet Take 81 mg by mouth daily.    [provider]  busPIRone (BUSPAR) 15 MG tablet Take 1 tablet (15 mg total) by mouth 2 (two) times daily. 02/05/22   Copland, Gay Filler, MD  carvedilol (COREG) 25 MG tablet Take 1 tablet (25 mg total) by mouth 2 (two) times daily. 02/17/22   Copland, Gay Filler, MD  Cholecalciferol (VITAMIN D3) 2000 units TABS Take 2,000 Units by mouth daily.    [provider]  cloNIDine (CATAPRES) 0.2 MG tablet TAKE 1 TABLET BY MOUTH TWICE  DAILY 04/29/22   Copland, Gay Filler, MD  Coenzyme Q10 300 MG CAPS Take 300  mg by mouth daily.     [provider]  Continuous Blood Gluc Receiver (FREESTYLE LIBRE 2 READER) DEVI Use for continuous blood glucose monitoring.  Patient is having symptoms of hypoglycemia 08/16/22   Copland, Gay Filler, MD  Continuous Blood Gluc Sensor (FREESTYLE LIBRE 2 SENSOR) MISC 1 Device by Does not apply route daily. 08/23/21   Copland, Gay Filler, MD  Continuous Blood Gluc Sensor (FREESTYLE LIBRE 2 SENSOR) MISC Use for continuous blood glucose monitoring.  Patient is having symptoms of hypoglycemia 08/16/22   Copland, Gay Filler, MD  Continuous Blood Gluc Sensor (FREESTYLE LIBRE 3 SENSOR) MISC Place 1 sensor on the skin every 14 days. Use to check glucose continuously 08/16/22   Copland, Gay Filler, MD  Cyanocobalamin (VITAMIN B-12 PO) Take 2,500 mg by mouth daily.    [provider]  diphenoxylate-atropine (LOMOTIL) 2.5-0.025 MG tablet Take 1 tablet by mouth every 6 (six) hours as needed for diarrhea or loose stools. 04/09/21   Cirigliano, Vito V, DO  glucose blood (FREESTYLE LITE) test strip Check blood sugar 1-3x daily 06/13/17   Copland, Gay Filler, MD  Lancets (FREESTYLE) lancets Check blood sugar once daily 01/19/17   Copland, Gay Filler, MD  lisinopril (ZESTRIL) 10 MG tablet Take 1-2 tablets (10-20 mg total) by mouth daily. 11/10/22   Copland, Gay Filler, MD  meloxicam (MOBIC) 15 MG tablet Take 1 tablet (15 mg total) by mouth daily as needed for pain. 12/12/20  Copland, Gay Filler, MD  metFORMIN (GLUCOPHAGE) 1000 MG tablet Take 1 tablet (1,000 mg total) by mouth 2 (two) times daily with a meal. 01/11/23   Copland, Gay Filler, MD  MULTIPLE VITAMIN PO Take 1 tablet by mouth daily.     [provider]  niacin 500 MG tablet Take 500 mg by mouth every morning.     [provider]  pantoprazole (PROTONIX) 40 MG tablet TAKE 1 TABLET BY MOUTH DAILY 12/17/22   Copland, Gay Filler, MD  rosuvastatin (CRESTOR) 40 MG tablet TAKE 1 TABLET BY MOUTH DAILY 10/26/22   Copland,  Gay Filler, MD  spironolactone (ALDACTONE) 25 MG tablet TAKE 1 TABLET BY MOUTH DAILY 04/26/22   Lelon Perla, MD  tamsulosin (FLOMAX) 0.4 MG CAPS capsule TAKE 1 CAPSULE EVERY DAY MAY INCREASE TO 2 AFTER 2 WEEKS IF NEED FOR PROSTATE SYMPTOMS 04/15/22   Copland, Gay Filler, MD  tirzepatide [facility]) 2.5 MG/0.5ML Pen INJECT 2.5 MG SUBCUTANEOUSLY WEEKLY 10/11/22   Copland, Gay Filler, MD  tirzepatide [facility]) 2.5 MG/0.5ML Pen Inject 2.5 mg into the skin once a week. 10/11/22   Copland, Gay Filler, MD      Allergies    Dexilant [dexlansoprazole]    Review of Systems   Review of Systems  Constitutional:  Negative for chills and fever.  HENT:  Positive for ear pain. Negative for rhinorrhea and sore throat.   Eyes:  Negative for visual disturbance.  Respiratory:  Negative for cough and shortness of breath.   Cardiovascular:  Negative for chest pain and leg swelling.  Gastrointestinal:  Negative for abdominal pain, diarrhea, nausea and vomiting.  Genitourinary:  Negative for dysuria.  Musculoskeletal:  Negative for back pain and neck pain.  Skin:  Negative for rash.  Neurological:  Positive for dizziness. Negative for light-headedness and headaches.  Hematological:  Does not bruise/bleed easily.  Psychiatric/Behavioral:  Negative for confusion.     Physical Exam Updated Vital Signs BP 124/81 (BP Location: Right Arm)   Pulse (!) 103   Temp 98.8 F (37.1 C) (Oral)   Resp 17   SpO2 93%  Physical Exam Vitals and nursing note reviewed.  Constitutional:      General: He is not in acute distress.    Appearance: He is well-developed. He is ill-appearing.  HENT:     Head: Normocephalic and atraumatic.     Left Ear: Tympanic membrane and ear canal normal.     Ears:     Comments: Right ear canal tender narrowed but not very erythematous.  A lot of fluid in it but he says that the antibiotic drops that he was placing in the ear.  Mild tenderness to palpation to the right mastoid area.  No  obvious erythema or swelling to that area.  Tympanic membrane not visualized due to the narrowing and swelling of the ear canal.    Mouth/Throat:     Mouth: Mucous membranes are moist.  Eyes:     Extraocular Movements: Extraocular movements intact.     Conjunctiva/sclera: Conjunctivae normal.     Pupils: Pupils are equal, round, and reactive to light.  Cardiovascular:     Rate and Rhythm: Normal rate and regular rhythm.     Heart sounds: No murmur heard. Pulmonary:     Effort: Pulmonary effort is normal. No respiratory distress.     Breath sounds: Normal breath sounds.  Abdominal:     Palpations: Abdomen is soft.     Tenderness: There is no abdominal tenderness.  Musculoskeletal:        General: No swelling.     Cervical back: Normal range of motion and neck supple.  Skin:    General: Skin is warm and dry.     Capillary Refill: Capillary refill takes less than 2 seconds.  Neurological:     Mental Status: He is alert and oriented to person, place, and time.     Sensory: No sensory deficit.     Motor: No weakness.     Comments: Patient with vertigo-like room spinning.  Psychiatric:        Mood and Affect: Mood normal.     ED Results / Procedures / Treatments   Labs (all labs ordered are listed, but only abnormal results are displayed) Labs Reviewed - No data to display  EKG None  Radiology No results found.  Procedures Procedures    Medications Ordered in ED Medications - No data to display  ED Course/ Medical Decision Making/ A&P                             Medical Decision Making Amount and/or Complexity of Data Reviewed Labs: ordered. Radiology: ordered.  Risk Prescription drug management.   Patient is ill-appearing.  Patient given IV fluids IV Zofran given p.o. Ativan.  Head CT raises concerns for right mastoiditis.  Based on that we will get dedicated CT scan to the temporal bone with IV contrast.  CBC normal no leukocytosis.  Hemoglobin 11.1  platelets are normal.  Complete metabolic panel still pending.  Patient will require consultation with ear nose and throat and may very well require admission.   Final Clinical Impression(s) / ED Diagnoses Final diagnoses:  None    Rx / DC Orders ED Discharge Orders     None         Fredia Sorrow, MD 01/19/23 1517

## 2023-01-19 NOTE — Discharge Instructions (Addendum)
Please follow-up with [facility]NT in 1 week's time for repeat assessment.  Take ciprofloxacin for the next week twice a day and take Ciprodex drops 4 drops in the right ear 4 times a day.  Can replace your ear wick if it falls out or replace every 3 days.

## 2023-01-19 NOTE — Patient Instructions (Signed)
Recent right ear discomfort progressively worsening over past 3 days. On exam rt mastoid tenderness, severe dizziness with vomiting last night.   2 urgent care visit and now 3rd visit. With the above finding and hx of diabetes have concern for mastoiditis. Thinks vest to be evaluated in ED for labs and probable state CT imaging. I did talke with charge nurse and discussed recent presentation.  Walked down stairs to ED with pt ad wife. Went by wheel chair.  Follow up with our office after ED evaluation and treatment

## 2023-01-19 NOTE — ED Provider Notes (Signed)
  Physical Exam  BP 135/84   Pulse 96   Temp 98.8 F (37.1 C) (Oral)   Resp 14   SpO2 93%     Procedures  Procedures  ED Course / MDM    Medical Decision Making Amount and/or Complexity of Data Reviewed Labs: ordered. Radiology: ordered.  Risk Prescription drug management.   5M, presenting with right mastoiditis, R ear pain started on Friday, saw in clinic today, concern for mastoiditis, vertigo and emesis, CT showing mastoiditis, CT temporal bone ordered. Getting Ativan, Zofran, IVF, and ABX. Likely admission, pending ENT consult.  On my exam, the patient had mild tenderness and swelling behind the ear about the mastoid, evidence of otitis externa present with pus coming out of the external auditory canal, unable to visualize the eardrum due to significant mount of fluid in the EAC.   CT Head: IMPRESSION:  1. No acute intracranial pathology.  2. Partial fluid opacification of the right mastoid air cells.  Correlate for mastoiditis.    CT temporal bone revealed the following: IMPRESSION:  1. Soft tissue thickening involving the right EAC and tympanic  membrane compatible with the history of recent treatment for otitis  externa.  2. Mild opacification of the right middle ear cavity and moderate  right mastoid effusion. No osseous erosion to indicate aggressive  infection or cholesteatoma.  3. Large volume fluid in the left maxillary sinus. Correlate for  acute sinusitis.   ENT Consult: Spoke with Dr. Fredric Dine of [facility], pt with otitis externa, no evidence of mastoiditis on CT. Pt should switch to Ciprodex drops for 10 days, place an ear wick, and start oral Ciprofloxacin for systemic pseudomonal coverage given his hx of diabetes. Follow-up with ENT in one week.        Regan Lemming, MD 01/19/23 [phone]

## 2023-01-19 NOTE — ED Notes (Signed)
Ear drops given to wife for home use. 4gtts, per ear, q4 hours.

## 2023-01-19 NOTE — ED Notes (Signed)
Discharge instructions reviewed with patient. Patient verbalizes understanding, no further questions at this time. Medications/prescriptions and follow up information provided. No acute distress noted at time of departure.  

## 2023-01-22 ENCOUNTER — Other Ambulatory Visit: Payer: Self-pay | Admitting: Family Medicine

## 2023-01-22 DIAGNOSIS — I10 Essential (primary) hypertension: Secondary | ICD-10-CM

## 2023-01-25 NOTE — Progress Notes (Unsigned)
[place] at [facility] [address] [place], [place] [address] [phone]  Date:  01/26/2023   Name:  David Singh   DOB:  Mar 08, 1951   MRN:  [id number]  PCP:  Darreld Mclean, MD    Chief Complaint: No chief complaint on file.   History of Present Illness:  David Singh is a 72 y.o. very pleasant male patient who presents with the following:  Patient seen today for ER follow-up Most recent visit with myself was a routine recheck in February History of diabetes, CAD with stent, hyperlipidemia, hypertension, significant peripheral arterial disease He notes his PAD is stable.  He can walk about the same distance before he gets pain  He was seen in urgent care on 3/25 and 3/26-he was diagnosed with otitis externa of the right ear and started on antibiotic drops He then had a visit with my partner Mackie Pai on 3/27, ever was concerned about mastoiditis and referred him to the ER  In the ER he was diagnosed with right mastoiditis CT temporal bone revealed the following: IMPRESSION:  1. Soft tissue thickening involving the right EAC and tympanic  membrane compatible with the history of recent treatment for otitis  externa.  2. Mild opacification of the right middle ear cavity and moderate  right mastoid effusion. No osseous erosion to indicate aggressive  infection or cholesteatoma.  3. Large volume fluid in the left maxillary sinus. Correlate for  acute sinusitis.    ENT Consult: Spoke with Dr. Fredric Dine of [facility], pt with otitis externa, no evidence of mastoiditis on CT. Pt should switch to Ciprodex drops for 10 days, place an ear wick, and start oral Ciprofloxacin for systemic pseudomonal coverage given his hx of diabetes. Follow-up with ENT in one week.  Lab Results  Component Value Date   HGBA1C 7.3 (H) 12/13/2022    Patient Active Problem List   Diagnosis Date Noted   Primary  osteoarthritis of right knee 03/03/2020   Chronic gout 03/09/2016   Hypertriglyceridemia 01/16/2015   Type 2 diabetes mellitus 01/06/2015   CAD in native artery 01/06/2015   History of coronary artery stent placement 01/06/2015   PAD (peripheral artery disease) 01/06/2015   Hyperlipidemia 01/06/2015   Essential hypertension 01/06/2015    Past Medical History:  Diagnosis Date   CAD (coronary artery disease)    Cancer ([place])    basal cell excision upper right ear 7 yrs ago   Complication of anesthesia    bad pain with anesthesia induction with colonscopy 6 yrs ago   Diabetes mellitus without complication (HCC)    type 2   GERD (gastroesophageal reflux disease)    Hyperlipidemia    Hypertension    Peripheral vascular disease ([place])    partial clogged artery right leg   Sleep apnea    yrs ago sleep study no cpap used    Past Surgical History:  Procedure Laterality Date   ABDOMINAL AORTOGRAM W/LOWER EXTREMITY N/A 12/27/2016   Procedure: Abdominal Aortogram w/Lower Extremity;  Surgeon: Angelia Mould, MD;  Location: [place] [facility];  Service: Cardiovascular;  Laterality: N/A;   CARDIAC SURGERY     CORONARY ANGIOPLASTY WITH STENT PLACEMENT  11/2004   in [place] Right    NASAL SINUS SURGERY  1976   TONSILLECTOMY      Social History   Tobacco Use   Smoking status: Former    Packs/day:  1.50    Years: 26.00    Additional pack years: 0.00    Total pack years: 39.00    Types: Cigarettes    Quit date: 72    Years since quitting: 30.2   Smokeless tobacco: Former  Scientific laboratory technician Use: Never used  Substance Use Topics   Alcohol use: Yes    Alcohol/week: 0.0 standard drinks of alcohol    Comment: 3 ounces on monday   Drug use: No    Family History  Problem Relation Age of Onset   Alcohol abuse Brother    Cancer Brother    Diabetes Brother    Hyperlipidemia Brother    Hypertension Brother    Diabetes Mother    Hypertension Mother     Hyperlipidemia Mother    Heart attack Mother    Hyperlipidemia Father    Hypertension Father     Allergies  Allergen Reactions   Dexilant [Dexlansoprazole]     Pressure in stomach and chest    Medication list has been reviewed and updated.  Current Outpatient Medications on File Prior to Visit  Medication Sig Dispense Refill   amLODipine (NORVASC) 10 MG tablet TAKE 1 TABLET BY MOUTH DAILY 100 tablet 2   aspirin 81 MG tablet Take 81 mg by mouth daily.     busPIRone (BUSPAR) 15 MG tablet Take 1 tablet (15 mg total) by mouth 2 (two) times daily. 180 tablet 1   carvedilol (COREG) 25 MG tablet TAKE 1 TABLET BY MOUTH TWICE  DAILY 200 tablet 2   Cholecalciferol (VITAMIN D3) 2000 units TABS Take 2,000 Units by mouth daily.     ciprofloxacin (CIPRO) 500 MG tablet Take 1 tablet (500 mg total) by mouth every 12 (twelve) hours for 7 days. 14 tablet 0   ciprofloxacin-dexamethasone (CIPRODEX) OTIC suspension Place 4 drops into the right ear 4 (four) times daily for 10 days. 8 mL 0   cloNIDine (CATAPRES) 0.2 MG tablet TAKE 1 TABLET BY MOUTH TWICE  DAILY 200 tablet 2   Coenzyme Q10 300 MG CAPS Take 300 mg by mouth daily.      Continuous Blood Gluc Receiver (FREESTYLE LIBRE 2 READER) DEVI Use for continuous blood glucose monitoring.  Patient is having symptoms of hypoglycemia 2 each 6   Continuous Blood Gluc Sensor (FREESTYLE LIBRE 2 SENSOR) MISC 1 Device by Does not apply route daily. 2 each 6   Continuous Blood Gluc Sensor (FREESTYLE LIBRE 2 SENSOR) MISC Use for continuous blood glucose monitoring.  Patient is having symptoms of hypoglycemia 2 each 6   Continuous Blood Gluc Sensor (FREESTYLE LIBRE 3 SENSOR) MISC Place 1 sensor on the skin every 14 days. Use to check glucose continuously 2 each 6   Cyanocobalamin (VITAMIN B-12 PO) Take 2,500 mg by mouth daily.     diphenoxylate-atropine (LOMOTIL) 2.5-0.025 MG tablet Take 1 tablet by mouth every 6 (six) hours as needed for diarrhea or loose stools. 60  tablet 1   glucose blood (FREESTYLE LITE) test strip Check blood sugar 1-3x daily 100 each 12   Lancets (FREESTYLE) lancets Check blood sugar once daily 100 each 12   lisinopril (ZESTRIL) 10 MG tablet Take 1-2 tablets (10-20 mg total) by mouth daily. 60 tablet 3   meclizine (ANTIVERT) 25 MG tablet Take 1 tablet (25 mg total) by mouth 3 (three) times daily as needed for dizziness. 30 tablet 0   meloxicam (MOBIC) 15 MG tablet Take 1 tablet (15 mg total) by mouth daily as  needed for pain. 90 tablet 1   metFORMIN (GLUCOPHAGE) 1000 MG tablet Take 1 tablet (1,000 mg total) by mouth 2 (two) times daily with a meal. 180 tablet 1   MULTIPLE VITAMIN PO Take 1 tablet by mouth daily.      niacin 500 MG tablet Take 500 mg by mouth every morning.      ondansetron (ZOFRAN-ODT) 4 MG disintegrating tablet Take 1 tablet (4 mg total) by mouth every 8 (eight) hours as needed. 12 tablet 1   pantoprazole (PROTONIX) 40 MG tablet TAKE 1 TABLET BY MOUTH DAILY 100 tablet 2   rosuvastatin (CRESTOR) 40 MG tablet TAKE 1 TABLET BY MOUTH DAILY 100 tablet 2   spironolactone (ALDACTONE) 25 MG tablet TAKE 1 TABLET BY MOUTH DAILY 30 tablet 11   tamsulosin (FLOMAX) 0.4 MG CAPS capsule TAKE 1 CAPSULE EVERY DAY MAY INCREASE TO 2 AFTER 2 WEEKS IF NEED FOR PROSTATE SYMPTOMS 180 capsule 1   tirzepatide (MOUNJARO) 2.5 MG/0.5ML Pen INJECT 2.5 MG SUBCUTANEOUSLY WEEKLY 2 mL 2   tirzepatide (MOUNJARO) 2.5 MG/0.5ML Pen Inject 2.5 mg into the skin once a week. 2 mL 1   No current facility-administered medications on file prior to visit.    Review of Systems:  As per HPI- otherwise negative.   Physical Examination: There were no vitals filed for this visit. There were no vitals filed for this visit. There is no height or weight on file to calculate BMI. Ideal Body Weight:    GEN: no acute distress. HEENT: Atraumatic, Normocephalic.  Ears and Nose: No external deformity. CV: RRR, No M/G/R. No JVD. No thrill. No extra heart  sounds. PULM: CTA B, no wheezes, crackles, rhonchi. No retractions. No resp. distress. No accessory muscle use. ABD: S, NT, ND, +BS. No rebound. No HSM. EXTR: No c/c/e PSYCH: Normally interactive. Conversant.    Assessment and Plan: ***  Signed Lamar Blinks, MD

## 2023-01-26 ENCOUNTER — Encounter: Payer: Self-pay | Admitting: Family Medicine

## 2023-01-26 ENCOUNTER — Ambulatory Visit (INDEPENDENT_AMBULATORY_CARE_PROVIDER_SITE_OTHER): Payer: Medicare Other | Admitting: Family Medicine

## 2023-01-26 VITALS — BP 118/78 | HR 78 | Temp 97.8°F | Resp 18

## 2023-01-26 DIAGNOSIS — D649 Anemia, unspecified: Secondary | ICD-10-CM | POA: Diagnosis not present

## 2023-01-26 DIAGNOSIS — M109 Gout, unspecified: Secondary | ICD-10-CM

## 2023-01-26 DIAGNOSIS — E871 Hypo-osmolality and hyponatremia: Secondary | ICD-10-CM | POA: Diagnosis not present

## 2023-01-26 DIAGNOSIS — H60391 Other infective otitis externa, right ear: Secondary | ICD-10-CM

## 2023-01-26 LAB — BASIC METABOLIC PANEL
BUN: 14 mg/dL (ref 6–23)
CO2: 26 mEq/L (ref 19–32)
Calcium: 9.3 mg/dL (ref 8.4–10.5)
Chloride: 99 mEq/L (ref 96–112)
Creatinine, Ser: 1.01 mg/dL (ref 0.40–1.50)
GFR: 75.04 mL/min (ref >60.00)
Glucose, Bld: 112 mg/dL — ABNORMAL HIGH (ref 70–99)
Potassium: 4 mEq/L (ref 3.5–5.1)
Sodium: 133 mEq/L — ABNORMAL LOW (ref 135–145)

## 2023-01-26 LAB — CBC
HCT: 34.4 % — ABNORMAL LOW (ref 39.0–52.0)
Hemoglobin: 11.2 g/dL — ABNORMAL LOW (ref 13.0–17.0)
MCHC: 32.7 g/dL (ref 30.0–36.0)
MCV: 81.4 fl (ref 78.0–100.0)
Platelets: 316 10*3/uL (ref 150.0–400.0)
RBC: 4.23 Mil/uL (ref 4.22–5.81)
RDW: 15.8 % — ABNORMAL HIGH (ref 11.5–15.5)
WBC: 9.9 10*3/uL (ref 4.0–10.5)

## 2023-01-26 LAB — URIC ACID: Uric Acid, Serum: 4.8 mg/dL (ref 4.0–7.8)

## 2023-01-26 MED ORDER — COLCHICINE 0.6 MG PO TABS: 0.6000 mg | ORAL_TABLET | Freq: Every day | ORAL | 0 refills | Status: DC

## 2023-01-26 NOTE — Patient Instructions (Signed)
It was good to see you today- I am sorry your ear has been hurting I will be in touch with you asap with your labs For gout- take one colchicine just one time

## 2023-01-27 ENCOUNTER — Other Ambulatory Visit (INDEPENDENT_AMBULATORY_CARE_PROVIDER_SITE_OTHER): Payer: Medicare Other

## 2023-01-27 ENCOUNTER — Other Ambulatory Visit: Payer: Self-pay

## 2023-01-27 DIAGNOSIS — D649 Anemia, unspecified: Secondary | ICD-10-CM

## 2023-01-27 DIAGNOSIS — M1711 Unilateral primary osteoarthritis, right knee: Secondary | ICD-10-CM | POA: Diagnosis not present

## 2023-01-27 DIAGNOSIS — D619 Aplastic anemia, unspecified: Secondary | ICD-10-CM

## 2023-01-27 LAB — FERRITIN: Ferritin: 40.4 ng/mL (ref 22.0–322.0)

## 2023-01-28 DIAGNOSIS — H6501 Acute serous otitis media, right ear: Secondary | ICD-10-CM | POA: Diagnosis not present

## 2023-01-28 DIAGNOSIS — H6991 Unspecified Eustachian tube disorder, right ear: Secondary | ICD-10-CM | POA: Diagnosis not present

## 2023-01-31 ENCOUNTER — Encounter: Payer: Self-pay | Admitting: Family Medicine

## 2023-02-06 ENCOUNTER — Other Ambulatory Visit: Payer: Self-pay | Admitting: Family Medicine

## 2023-02-07 ENCOUNTER — Encounter: Payer: Self-pay | Admitting: *Deleted

## 2023-02-08 ENCOUNTER — Encounter: Payer: Self-pay | Admitting: Family Medicine

## 2023-02-10 ENCOUNTER — Ambulatory Visit (INDEPENDENT_AMBULATORY_CARE_PROVIDER_SITE_OTHER): Payer: Medicare Other | Admitting: Family Medicine

## 2023-02-10 VITALS — BP 110/62 | HR 75 | Temp 97.7°F | Resp 18 | Ht 69.0 in | Wt 247.6 lb

## 2023-02-10 DIAGNOSIS — B029 Zoster without complications: Secondary | ICD-10-CM | POA: Diagnosis not present

## 2023-02-10 MED ORDER — HYDROCODONE-ACETAMINOPHEN 5-325 MG PO TABS: 1.0000 | ORAL_TABLET | Freq: Three times a day (TID) | ORAL | 0 refills | Status: AC | PRN

## 2023-02-10 MED ORDER — VALACYCLOVIR HCL 1 G PO TABS: 1000.0000 mg | ORAL_TABLET | Freq: Three times a day (TID) | ORAL | 0 refills | Status: DC

## 2023-02-10 NOTE — Patient Instructions (Addendum)
It was good to see you today- I do think your pain is due to shingles We will treat with valtrex for the viral infection and hydrocodone as needed for pain  Once the rash is cleared up you can look at getting the newer shingles vaccine series, called Shingrix.  You will get this at your pharmacy.  It is a 2 dose series-completed once per lifetime  Please let me know if your pain is not responding to our treatment as above

## 2023-02-10 NOTE — Progress Notes (Signed)
[facility] at [facility] [address], [address] [place], [place] [address] [phone]  Date:  02/10/2023   Name:  David Singh   DOB:  1951-02-15   MRN:  [id number]  PCP:  Pearline Cables, MD    Chief Complaint: L side-back pain (X 4 days worse at night. Radiates down the leg. /Concerns/ questions: 1. Rash on the bottom of the leg. 2. Pt asks if he can restart Mounjaro since he stopped d/t all the hearing issues. )   History of Present Illness:  David Singh is a 72 y.o. very pleasant male patient who presents with the following:  Patient seen today with concern of sciatic nerve pain Most recent visit with myself was earlier this month to follow-up on ear infection-he showed improvement at that time and we had plans to follow-up with ENT  History of diabetes, CAD with stent, hyperlipidemia, hypertension, significant peripheral arterial disease He notes his PAD is stable.  He can walk about the same distance before he gets pain  DM has been under ok control  He noted the pain first about 4 days ago- left leg He tends to bruise and bleed very easily- they have 2 large dogs and he tends to have some scratches on his arms   Lab Results  Component Value Date   HGBA1C 7.3 (H) 12/13/2022     Patient Active Problem List   Diagnosis Date Noted   Primary osteoarthritis of right knee 03/03/2020   Chronic gout 03/09/2016   Hypertriglyceridemia 01/16/2015   Type 2 diabetes mellitus 01/06/2015   CAD in native artery 01/06/2015   History of coronary artery stent placement 01/06/2015   PAD (peripheral artery disease) 01/06/2015   Hyperlipidemia 01/06/2015   Essential hypertension 01/06/2015    Past Medical History:  Diagnosis Date   CAD (coronary artery disease)    Cancer    basal cell excision upper right ear 7 yrs ago   Complication of anesthesia    bad pain with anesthesia induction with colonscopy 6 yrs ago    Diabetes mellitus without complication    type 2   GERD (gastroesophageal reflux disease)    Hyperlipidemia    Hypertension    Peripheral vascular disease    partial clogged artery right leg   Sleep apnea    yrs ago sleep study no cpap used    Past Surgical History:  Procedure Laterality Date   ABDOMINAL AORTOGRAM W/LOWER EXTREMITY N/A 12/27/2016   Procedure: Abdominal Aortogram w/Lower Extremity;  Surgeon: Chuck Hint, MD;  [facility];  Service: Cardiovascular;  Laterality: N/A;   CARDIAC SURGERY     CORONARY ANGIOPLASTY WITH STENT PLACEMENT  11/2004   in [place]   MENISCUS REPAIR Right    NASAL SINUS SURGERY  1976   TONSILLECTOMY      Social History   Tobacco Use   Smoking status: Former    Packs/day: 1.50    Years: 26.00    Additional pack years: 0.00    Total pack years: 39.00    Types: Cigarettes    Quit date: 1994    Years since quitting: 30.3   Smokeless tobacco: Former  Building services engineer Use: Never used  Substance Use Topics   Alcohol use: Yes    Alcohol/week: 0.0 standard drinks of alcohol    Comment: 3 ounces on monday   Drug use: No    Family History  Problem Relation Age of Onset   Alcohol abuse Brother    Cancer Brother    Diabetes Brother    Hyperlipidemia Brother    Hypertension Brother    Diabetes Mother    Hypertension Mother    Hyperlipidemia Mother    Heart attack Mother    Hyperlipidemia Father    Hypertension Father     Allergies  Allergen Reactions   Dexilant [Dexlansoprazole]     Pressure in stomach and chest    Medication list has been reviewed and updated.  Current Outpatient Medications on File Prior to Visit  Medication Sig Dispense Refill   amLODipine (NORVASC) 10 MG tablet TAKE 1 TABLET BY MOUTH DAILY 100 tablet 2   aspirin 81 MG tablet Take 81 mg by mouth daily.     busPIRone (BUSPAR) 15 MG tablet Take 1 tablet (15 mg total) by mouth 2 (two) times daily. 180 tablet 1   carvedilol (COREG)  25 MG tablet TAKE 1 TABLET BY MOUTH TWICE  DAILY 200 tablet 2   Cholecalciferol (VITAMIN D3) 2000 units TABS Take 2,000 Units by mouth daily.     cloNIDine (CATAPRES) 0.2 MG tablet TAKE 1 TABLET BY MOUTH TWICE  DAILY 200 tablet 2   Coenzyme Q10 300 MG CAPS Take 300 mg by mouth daily.      colchicine 0.6 MG tablet Take 1 tablet (0.6 mg total) by mouth daily. Take once for gout- hold the rest for later use 20 tablet 0   Continuous Blood Gluc Receiver (FREESTYLE LIBRE 2 READER) DEVI Use for continuous blood glucose monitoring.  Patient is having symptoms of hypoglycemia 2 each 6   Continuous Blood Gluc Sensor (FREESTYLE LIBRE 2 SENSOR) MISC 1 Device by Does not apply route daily. 2 each 6   Continuous Blood Gluc Sensor (FREESTYLE LIBRE 2 SENSOR) MISC Use for continuous blood glucose monitoring.  Patient is having symptoms of hypoglycemia 2 each 6   Continuous Blood Gluc Sensor (FREESTYLE LIBRE 3 SENSOR) MISC Place 1 sensor on the skin every 14 days. Use to check glucose continuously 2 each 6   Cyanocobalamin (VITAMIN B-12 PO) Take 2,500 mg by mouth daily.     diphenoxylate-atropine (LOMOTIL) 2.5-0.025 MG tablet Take 1 tablet by mouth every 6 (six) hours as needed for diarrhea or loose stools. 60 tablet 1   glucose blood (FREESTYLE LITE) test strip Check blood sugar 1-3x daily 100 each 12   Lancets (FREESTYLE) lancets Check blood sugar once daily 100 each 12   lisinopril (ZESTRIL) 10 MG tablet TAKE 1-2 TABLETS (10-20 MG TOTAL) BY MOUTH DAILY. 180 tablet 1   meclizine (ANTIVERT) 25 MG tablet Take 1 tablet (25 mg total) by mouth 3 (three) times daily as needed for dizziness. 30 tablet 0   meloxicam (MOBIC) 15 MG tablet Take 1 tablet (15 mg total) by mouth daily as needed for pain. 90 tablet 1   metFORMIN (GLUCOPHAGE) 1000 MG tablet Take 1 tablet (1,000 mg total) by mouth 2 (two) times daily with a meal. 180 tablet 1   MULTIPLE VITAMIN PO Take 1 tablet by mouth daily.      niacin 500 MG tablet Take 500 mg  by mouth every morning.      ondansetron (ZOFRAN-ODT) 4 MG disintegrating tablet Take 1 tablet (4 mg total) by mouth every 8 (eight) hours as needed. 12 tablet 1   pantoprazole (PROTONIX) 40 MG tablet TAKE 1 TABLET BY MOUTH DAILY 100 tablet 2   rosuvastatin (CRESTOR) 40 MG tablet  TAKE 1 TABLET BY MOUTH DAILY 100 tablet 2   spironolactone (ALDACTONE) 25 MG tablet TAKE 1 TABLET BY MOUTH DAILY 30 tablet 11   tamsulosin (FLOMAX) 0.4 MG CAPS capsule TAKE 1 CAPSULE EVERY DAY MAY INCREASE TO 2 AFTER 2 WEEKS IF NEED FOR PROSTATE SYMPTOMS 180 capsule 1   tirzepatide (MOUNJARO) 2.5 MG/0.5ML Pen INJECT 2.5 MG SUBCUTANEOUSLY WEEKLY 2 mL 2   tirzepatide (MOUNJARO) 2.5 MG/0.5ML Pen Inject 2.5 mg into the skin once a week. 2 mL 1   No current facility-administered medications on file prior to visit.    Review of Systems:  As per HPI- otherwise negative.   Physical Examination: Vitals:   02/10/23 1006  BP: 110/62  Pulse: 75  Resp: 18  Temp: 97.7 F (36.5 C)  SpO2: 96%   Vitals:   02/10/23 1006  Weight: 247 lb 9.6 oz (112.3 kg)  Height:  (1.753 m)   Body mass index is 36.56 kg/m. Ideal Body Weight: Weight in (lb) to have BMI = 25: 168.9  GEN: no acute distress.  Obese, looks well and his normal self HEENT: Atraumatic, Normocephalic.  Ears and Nose: No external deformity. CV: RRR, No M/G/R. No JVD. No thrill. No extra heart sounds. PULM: CTA B, no wheezes, crackles, rhonchi. No retractions. No resp. distress. No accessory muscle use. EXTR: No c/c/e PSYCH: Normally interactive. Conversant.  Normal thoracolumbar flexion and extension.  Normal bilateral lower extremity strength, sensation, deep tendon reflex.  Normal straight leg raise bilaterally He indicates his left posterior buttock over the sciatic notch as area where he first noted pain, no rash is present Mild vesicular rash c/w shingles on left anterior shin c/w shingles S1  Assessment and Plan: Herpes zoster without  complication - Plan: valACYclovir (VALTREX) 1000 MG tablet, HYDROcodone-acetaminophen (NORCO/VICODIN) 5-325 MG tablet  Patient seen today with back pain, on exam he has a vesicular rash very suggestive of shingles which we suspect is the cause of his pain He did have Zostavax several years ago which is likely why his shingles is so mild Will start him on Valtrex, he requests medication for pain I provided a small amount of hydrocodone Discussed getting Shingrix series once his rash is completely healed He will let me know if not getting better over the next few days He would like to start back on [facility], he has this available at home.  Advised this is fine  Signed Abbe Amsterdam, MD

## 2023-02-16 ENCOUNTER — Ambulatory Visit: Payer: Medicare Other | Admitting: Family Medicine

## 2023-02-28 ENCOUNTER — Telehealth: Payer: Self-pay | Admitting: Family Medicine

## 2023-02-28 DIAGNOSIS — M1990 Unspecified osteoarthritis, unspecified site: Secondary | ICD-10-CM | POA: Diagnosis not present

## 2023-02-28 DIAGNOSIS — I10 Essential (primary) hypertension: Secondary | ICD-10-CM | POA: Diagnosis not present

## 2023-02-28 DIAGNOSIS — R600 Localized edema: Secondary | ICD-10-CM | POA: Diagnosis not present

## 2023-02-28 DIAGNOSIS — D631 Anemia in chronic kidney disease: Secondary | ICD-10-CM | POA: Diagnosis not present

## 2023-02-28 DIAGNOSIS — R0602 Shortness of breath: Secondary | ICD-10-CM | POA: Diagnosis not present

## 2023-02-28 DIAGNOSIS — N281 Cyst of kidney, acquired: Secondary | ICD-10-CM | POA: Diagnosis not present

## 2023-02-28 DIAGNOSIS — N138 Other obstructive and reflux uropathy: Secondary | ICD-10-CM | POA: Diagnosis not present

## 2023-02-28 DIAGNOSIS — M79632 Pain in left forearm: Secondary | ICD-10-CM | POA: Diagnosis not present

## 2023-02-28 DIAGNOSIS — R531 Weakness: Secondary | ICD-10-CM | POA: Diagnosis not present

## 2023-02-28 DIAGNOSIS — I959 Hypotension, unspecified: Secondary | ICD-10-CM | POA: Diagnosis not present

## 2023-02-28 DIAGNOSIS — L03113 Cellulitis of right upper limb: Secondary | ICD-10-CM | POA: Diagnosis not present

## 2023-02-28 DIAGNOSIS — D72829 Elevated white blood cell count, unspecified: Secondary | ICD-10-CM | POA: Diagnosis not present

## 2023-02-28 DIAGNOSIS — E860 Dehydration: Secondary | ICD-10-CM | POA: Diagnosis not present

## 2023-02-28 DIAGNOSIS — B029 Zoster without complications: Secondary | ICD-10-CM | POA: Diagnosis not present

## 2023-02-28 DIAGNOSIS — S51852A Open bite of left forearm, initial encounter: Secondary | ICD-10-CM | POA: Diagnosis not present

## 2023-02-28 DIAGNOSIS — I2510 Atherosclerotic heart disease of native coronary artery without angina pectoris: Secondary | ICD-10-CM | POA: Diagnosis not present

## 2023-02-28 DIAGNOSIS — I129 Hypertensive chronic kidney disease with stage 1 through stage 4 chronic kidney disease, or unspecified chronic kidney disease: Secondary | ICD-10-CM | POA: Diagnosis not present

## 2023-02-28 DIAGNOSIS — Z85828 Personal history of other malignant neoplasm of skin: Secondary | ICD-10-CM | POA: Diagnosis not present

## 2023-02-28 DIAGNOSIS — I739 Peripheral vascular disease, unspecified: Secondary | ICD-10-CM | POA: Diagnosis not present

## 2023-02-28 DIAGNOSIS — A419 Sepsis, unspecified organism: Secondary | ICD-10-CM | POA: Diagnosis not present

## 2023-02-28 DIAGNOSIS — E782 Mixed hyperlipidemia: Secondary | ICD-10-CM | POA: Diagnosis not present

## 2023-02-28 DIAGNOSIS — Z23 Encounter for immunization: Secondary | ICD-10-CM | POA: Diagnosis not present

## 2023-02-28 DIAGNOSIS — L03114 Cellulitis of left upper limb: Secondary | ICD-10-CM | POA: Diagnosis not present

## 2023-02-28 DIAGNOSIS — E1151 Type 2 diabetes mellitus with diabetic peripheral angiopathy without gangrene: Secondary | ICD-10-CM | POA: Diagnosis not present

## 2023-02-28 DIAGNOSIS — R000 Tachycardia, unspecified: Secondary | ICD-10-CM | POA: Diagnosis not present

## 2023-02-28 DIAGNOSIS — R112 Nausea with vomiting, unspecified: Secondary | ICD-10-CM | POA: Diagnosis not present

## 2023-02-28 DIAGNOSIS — N182 Chronic kidney disease, stage 2 (mild): Secondary | ICD-10-CM | POA: Diagnosis not present

## 2023-02-28 DIAGNOSIS — B950 Streptococcus, group A, as the cause of diseases classified elsewhere: Secondary | ICD-10-CM | POA: Diagnosis not present

## 2023-02-28 DIAGNOSIS — N179 Acute kidney failure, unspecified: Secondary | ICD-10-CM | POA: Diagnosis not present

## 2023-02-28 NOTE — Telephone Encounter (Signed)
FYI: This call has been transferred to Access Nurse. Once the result note has been entered staff can address the message at that time.  Patient called in with the following symptoms: dog scratch that has become infected. Pt has had fever of 103 for last couple of nights, diarrhea, headache   Red Word:Fever of 103   Please advise at Mobile [phone] (mobile)  Message is routed to Provider Pool and [facility] Triage

## 2023-02-28 NOTE — Telephone Encounter (Signed)
Initial Comment Caller states that his left upper arm just below the elbow is in pain. He has an infection there. He is having some diarrhea, and also vomiting yesterday and there was no blood seen. He has urinated in the past 8 hours. His doctors name is Dr. Dallas Schimke. His temperature is up and down and it is now 99.34. When his temp goes up, he is unable to stand and walk. It has went up to 103.0 within t he past 2 nights. He also gets incoherent. His Covid test was negative. He also fell last night. Translation No Nurse Assessment Nurse: Mayford Knife, RN, Lupe Carney Date/Time (Eastern Time): 02/28/2023 10:19:39 AM Confirm and document reason for call. If symptomatic, describe symptoms. ---Vomiting, diarrhea. HA. Does have an infection in arm below elbow. Has had fever, becomes weak with fever and did fall yesterday, but did not hit head or pass out. Does the patient have any new or worsening symptoms? ---Yes Will a triage be completed? ---Yes Related visit to physician within the last 2 weeks? ---No Does the PT have any chronic conditions? (i.e. diabetes, asthma, this includes High risk factors for pregnancy, etc.) ---Yes List chronic conditions. ---type 2 diabetes, HTN Is this a behavioral health or substance abuse call? ---No Guidelines Guideline Title Affirmed Question Affirmed Notes Nurse Date/Time (Eastern Time) Weakness (Generalized) and Fatigue Shock suspected (e.g., cold/pale/ clammy skin, too Turner, RN, Rebekah 02/28/2023 10:22:47 AM PLEASE NOTE: All timestamps contained within this report are represented as [place] Standard Time. CONFIDENTIALTY NOTICE: This fax transmission is intended only for the addressee. It contains information that is legally privileged, confidential or otherwise protected from use or disclosure. If you are not the intended recipient, you are strictly prohibited from reviewing, disclosing, copying using or disseminating any of this information or  taking any action in reliance on or regarding this information. If you have received this fax in error, please notify us immediately by telephone so that we can arrange for its return to [place]. Phone: [phone], Toll-Free: [phone], Fax: [phone] Page: 2 of 2 Call Id: [id number] Guidelines Guideline Title Affirmed Question Affirmed Notes Nurse Date/Time Lamount Cohen Time) weak to stand, low BP, rapid pulse) Disp. Time Lamount Cohen Time) Disposition Final User 02/28/2023 10:24:53 AM Call EMS 911 Now Yes Turner, RN, Lupe Carney 02/28/2023 10:25:49 AM 911 Outcome Documentation Turner, RN, Lupe Carney Reason: Declines follow up call from nurse but will call EMS. Final Disposition 02/28/2023 10:24:53 AM Call EMS 911 Now Yes Turner, RN, Lupe Carney Caller Disagree/Comply Comply Caller Understands Yes PreDisposition Call Doctor Care Advice Given Per Guideline CALL EMS 911 NOW: * Immediate medical attention is needed. You need to hang up and call 911 (or an ambulance). CARE ADVICE given per Weakness and Fatigue (Adult) guideline

## 2023-03-01 ENCOUNTER — Encounter: Payer: Self-pay | Admitting: Family Medicine

## 2023-03-01 NOTE — Telephone Encounter (Signed)
Pt wife says he is admitted at [facility] in [place]. Wife says if we need them to call her number at: [phone].

## 2023-03-04 ENCOUNTER — Telehealth: Payer: Self-pay | Admitting: *Deleted

## 2023-03-04 ENCOUNTER — Encounter: Payer: Self-pay | Admitting: *Deleted

## 2023-03-04 NOTE — Transitions of Care (Post Inpatient/ED Visit) (Addendum)
03/04/2023  Name: David Singh MRN: [id number] DOB: Jul 01, 1951  Today's TOC FU Call Status: Today's TOC FU Call Status:: Successful TOC FU Call Competed TOC FU Call Complete Date: 03/04/23  Transition Care Management Follow-up Telephone Call Date of Discharge: 03/03/23 Discharge Facility: Other ([facility]) Name of Other ([facility]) Discharge Facility: [facility] Type of Discharge: Inpatient Admission Primary Inpatient Discharge Diagnosis:: sepsis/ (L) arm cellulitis from dog scratch How have you been since you were released from the hospital?: Better ("Overall better I guess, but the antibiotics made me throw up today and I think my arm is still swollen, maybe worse than it was when I left the hospital yesterday.  I don't want to go to the doctor any sooner if I can't see Dr. [name] Lager") Any questions or concerns?: Yes Patient Questions/Concerns:: possible worsening swelling of (L) UE- patient/ spouse report is not definitive- confirms arm is in dependent position when patient is not sitting/ lying; difficult to ascertain degree of increased swelling from patient and spouse reoprt; and nausea/ vomiting with dual antibiotic therapy Patient Questions/Concerns Addressed: Other: (attempted to obtain earlier appointment than already scheduled for hospital follow up with PCP- was able to get 03/07/23 appointment with APP- but patient refused appointment/ advised to elevate UE/ discussed benefits of OTC probiotics)  Items Reviewed: Did you receive and understand the discharge instructions provided?: Yes Medications obtained,verified, and reconciled?: Yes (Medications Reviewed) (Partial medication review completed- patient adamantly declines full review; confirmed patient obtained/ is taking newly Rx'd antibiotic medications as instructed; self-manages medications)  Spouse reports they did not obtain prescribed Nephro-Vite post-hospital discharge; states she did not realize it was even  prescribed until time of TOC call  Any new allergies since your discharge?: No Dietary orders reviewed?: Yes Type of Diet Ordered:: "Pretty much healthy, diabetic as much as possible" Do you have support at home?: Yes People in Home: spouse Name of Support/Comfort Primary Source: Reports essentially independent in self-care activities; spouse assists as/ if needed/ indicated  Medications Reviewed Today: Medications Reviewed Today     Reviewed by Michaela Corner, RN (Registered Nurse) on 03/04/23 at 1551  Med List Status: <None>   Medication Order Taking? Sig Documenting Provider Last Dose Status Informant  amLODipine (NORVASC) 10 MG tablet [id number] No TAKE 1 TABLET BY MOUTH DAILY  Patient not taking: Reported on 03/04/2023   Copland, Gwenlyn Found, MD Not Taking Active   amoxicillin-clavulanate (AUGMENTIN) 125-31.25 MG/5ML suspension [id number] Yes Take 125 mg by mouth 2 (two) times daily. For dog scratch- UE cellulitis- take for 14 days per hospital discharging provider at [facility], Gwenlyn Found, MD Taking Active Self           Med Note Michaela Corner   Fri Mar 04, 2023  3:48 PM) 03/04/23: reports started by [facility] hospital discharge provider on 03/03/23- to take for 14 days   aspirin 81 MG tablet [id number]  Take 81 mg by mouth daily. [provider]  Active Self  busPIRone (BUSPAR) 15 MG tablet [id number]  Take 1 tablet (15 mg total) by mouth 2 (two) times daily. Copland, Gwenlyn Found, MD  Active   carvedilol (COREG) 25 MG tablet [id number]  TAKE 1 TABLET BY MOUTH TWICE  DAILY Copland, Gwenlyn Found, MD  Active   Cholecalciferol (VITAMIN D3) 2000 units TABS [id number]  Take 2,000 Units by mouth daily. [provider]  Active   cloNIDine (CATAPRES) 0.2 MG tablet [id number]  TAKE 1 TABLET BY MOUTH TWICE  DAILY Copland, Jessica C,  MD  Active   Coenzyme Q10 300 MG CAPS [id number]  Take 300 mg by mouth daily.  [provider]  Active Self  colchicine 0.6 MG tablet [id number]  Take  1 tablet (0.6 mg total) by mouth daily. Take once for gout- hold the rest for later use Copland, Gwenlyn Found, MD  Active   Continuous Blood Gluc Receiver (FREESTYLE LIBRE 2 READER) DEVI [id number]  Use for continuous blood glucose monitoring.  Patient is having symptoms of hypoglycemia Copland, Gwenlyn Found, MD  Active   Continuous Blood Gluc Sensor (FREESTYLE LIBRE 2 SENSOR) MISC [id number]  1 Device by Does not apply route daily. Copland, Gwenlyn Found, MD  Active   Continuous Blood Gluc Sensor (FREESTYLE LIBRE 2 SENSOR) [place] [id number]  Use for continuous blood glucose monitoring.  Patient is having symptoms of hypoglycemia Copland, Gwenlyn Found, MD  Active   Continuous Blood Gluc Sensor (FREESTYLE LIBRE 3 SENSOR) [place] [id number]  Place 1 sensor on the skin every 14 days. Use to check glucose continuously Copland, Gwenlyn Found, MD  Active   Cyanocobalamin (VITAMIN B-12 PO) [id number]  Take 2,500 mg by mouth daily. [provider]  Active   diphenoxylate-atropine (LOMOTIL) 2.5-0.025 MG tablet [id number]  Take 1 tablet by mouth every 6 (six) hours as needed for diarrhea or loose stools. Cirigliano, Vito V, DO  Active   doxycycline (VIBRA-TABS) 100 MG tablet [id number] Yes Take 100 mg by mouth 2 (two) times daily. Take for 14 days- per instructions of [facility] hospital discharging provider Copland, Gwenlyn Found, MD Taking Active Spouse/Significant Other           Med Note Michaela Corner   Fri Mar 04, 2023  3:47 PM) 03/04/23: reports started by [facility] hospital discharge provider on 03/03/23- to take for 14 days  glucose blood (FREESTYLE LITE) test strip [id number]  Check blood sugar 1-3x daily Copland, Gwenlyn Found, MD  Active   Lancets (FREESTYLE) lancets [id number]  Check blood sugar once daily Copland, Gwenlyn Found, MD  Active   lisinopril (ZESTRIL) 10 MG tablet [id number]  TAKE 1-2 TABLETS (10-20 MG TOTAL) BY MOUTH DAILY. Copland, Gwenlyn Found, MD  Active   meclizine (ANTIVERT) 25 MG tablet [id number]  Take 1 tablet (25 mg  total) by mouth 3 (three) times daily as needed for dizziness. Vanetta Mulders, MD  Active   meloxicam [facility]) 15 MG tablet [id number]  Take 1 tablet (15 mg total) by mouth daily as needed for pain. Copland, Gwenlyn Found, MD  Active   metFORMIN (GLUCOPHAGE) 1000 MG tablet [id number]  Take 1 tablet (1,000 mg total) by mouth 2 (two) times daily with a meal. Copland, Gwenlyn Found, MD  Active   MULTIPLE VITAMIN PO [id number]  Take 1 tablet by mouth daily.  [provider]  Active Self  multivitamin (RENA-VIT) TABS tablet [id number] No Take 1 tablet by mouth daily. 03/04/23: reports started by [facility] hospital discharge provider on 03/03/23 Copland, Gwenlyn Found, MD Unknown Active Self           Med Note Michaela Corner   Fri Mar 04, 2023  3:51 PM) 03/04/23: reports started by [facility] hospital discharge provider on 03/03/23- spouse reports was not provided by outpatient pharmacy when she picked up antibiotics- states patient not taking   niacin 500 MG tablet [id number]  Take 500 mg by mouth every morning.  [provider]  Active Self  ondansetron (ZOFRAN-ODT) 4 MG disintegrating tablet [id number]  Take 1 tablet (4 mg total) by mouth  every 8 (eight) hours as needed. Vanetta Mulders, MD  Active   pantoprazole (PROTONIX) 40 MG tablet [id number]  TAKE 1 TABLET BY MOUTH DAILY Copland, Gwenlyn Found, MD  Active   rosuvastatin (CRESTOR) 40 MG tablet [id number]  TAKE 1 TABLET BY MOUTH DAILY Copland, Gwenlyn Found, MD  Active   spironolactone (ALDACTONE) 25 MG tablet [id number]  TAKE 1 TABLET BY MOUTH DAILY Jens Som Madolyn Frieze, MD  Active   tamsulosin (FLOMAX) 0.4 MG CAPS capsule [id number]  TAKE 1 CAPSULE EVERY DAY MAY INCREASE TO 2 AFTER 2 WEEKS IF NEED FOR PROSTATE SYMPTOMS Copland, Gwenlyn Found, MD  Active   tirzepatide [facility]) 2.5 MG/0.5ML Pen [id number]  INJECT 2.5 MG SUBCUTANEOUSLY WEEKLY Copland, Gwenlyn Found, MD  Active   tirzepatide [facility]) 2.5 MG/0.5ML Pen [id number]  Inject 2.5 mg into the skin once a week. Copland,  Gwenlyn Found, MD  Active   valACYclovir (VALTREX) 1000 MG tablet [id number] No Take 1 tablet (1,000 mg total) by mouth 3 (three) times daily.  Patient not taking: Reported on 03/04/2023   Copland, Gwenlyn Found, MD Not Taking Active            Med Note Michaela Corner   Fri Mar 04, 2023  3:45 PM) 03/04/23- spouse reports patient was told not to take by hospital discharging provider at [facility] on 03/03/23            Home Care and Equipment/Supplies: Were Home Health Services Ordered?: No Any new equipment or medical supplies ordered?: No  Functional Questionnaire: Do you need assistance with bathing/showering or dressing?: No (wife assisting as needed after recent hospital visit) Do you need assistance with meal preparation?: No (wife assisting as needed after recent hospital visit) Do you need assistance with eating?: No Do you have difficulty maintaining continence: No (wife assisting as needed after recent hospital visit) Do you need assistance with getting out of bed/getting out of a chair/moving?: No (wife assisting as needed after recent hospital visit) Do you have difficulty managing or taking your medications?: No (wife assisting as needed after recent hospital visit)  Follow up appointments reviewed: PCP Follow-up appointment confirmed?: Yes (offered sooner appointment options for patient based on his report of having problems post-hospital discharge-- patient decined all options, states wants to keep 03/09/23 PCP appointment with Dr. [name] Lager) Date of PCP follow-up appointment?: 03/09/23 Follow-up Provider: PCP Specialist Hospital Follow-up appointment confirmed?: NA (verified not indicated per hospital discharging provider discharge notes) Do you need transportation to your follow-up appointment?: No Do you understand care options if your condition(s) worsen?: Yes-patient verbalized understanding  SDOH Interventions Today    Flowsheet Row Most Recent Value  SDOH Interventions   Food  Insecurity Interventions Intervention Not Indicated  Transportation Interventions Intervention Not Indicated  [wife provides transportation post-recent hospitalization]      TOC Interventions Today    Flowsheet Row Most Recent Value  TOC Interventions   TOC Interventions Discussed/Reviewed TOC Interventions Discussed  [declined ongoing or further care coordination outreach post-TOC call today]      Interventions Today    Flowsheet Row Most Recent Value  Chronic Disease   Chronic disease during today's visit Other  [sepsis/ (L) UE cellulitis from dog scratch]  General Interventions   General Interventions Discussed/Reviewed General Interventions Discussed, Doctor Visits, Durable Medical Equipment (DME)  Doctor Visits Discussed/Reviewed PCP, Doctor Visits Discussed  Durable Medical Equipment (DME) Other  [confirmed using cane "pretty regularly"]  PCP/Specialist Visits Compliance with follow-up visit  Education Interventions   Education  Provided Provided Education  Provided Verbal Education On Medication, Other, When to see the doctor  [potential benefits of starting probiotics in setting of prolonged antibiotic therapy,  side effects of antibiotics- importance of taking antibiotics as prescribed,  benefits of keeping (L) UE elevated to help with swelling when in dependent position]  Nutrition Interventions   Nutrition Discussed/Reviewed Nutrition Discussed  Pharmacy Interventions   Pharmacy Dicussed/Reviewed Pharmacy Topics Discussed      Caryl Pina, RN, BSN, CCRN Alumnus RN CM Care Coordination/ Transition of Care- [facility] Care Management [phone]: direct office

## 2023-03-06 NOTE — Progress Notes (Unsigned)
[facility] [address] [place], [place] [address] [phone]  Date:  03/09/2023   Name:  David Singh   DOB:  Nov 23, 1950   MRN:  [id number]  PCP:  David Cables, MD    Chief Complaint: No chief complaint on file.   History of Present Illness:  David Singh is a 72 y.o. very pleasant male patient who presents with the following:  Seen today for follow-up of hospital admission for sepsis and left arm cellulitis History of diabetes, CAD with stent, hyperlipidemia, hypertension, significant peripheral arterial disease He notes his PAD is stable.  He can walk about the same distance before he gets pain I last saw him about a month ago when he had shingles  Admitted from 5/6 through 5/9 at [facility] LUE cellulitis due to dog scratch with superficial abscess-some improvement but remains edematous at discharge. Patient and his wife insisted he was not bitten by the dog. Procalcitonin 1.46 >> 0.51 ESR 74, CRP 247.5 at discharge. Dog is a Labradoodle with up-to-date shots. Patient up-to-date with tetanus immunization.  Wound culture with heavy growth of Streptococcus pyogenes (group A) Patient initially treated with Zosyn and vancomycin. Vancomycin was discontinued on 03/01/2023 in the context of worsening renal function. I changed his antibiotics to IV cefepime and IV Flagyl to minimize nephrotoxicity. Consulted infectious disease but the consultant was unable to see the patient. However, he reviewed the chart and recommended ceftriaxone 2 g IV every 24 hours and linezolid 600 mg twice daily. ID also recommended Augmentin and doxycycline for 2 weeks at discharge. I would have like to keep patient for at least another night since his inflammatory markers are elevated. Unfortunately they were not checked at the time of admission. However, patient is now afebrile and his hypotension has resolved. He feels much  better and really wants to go home. Wife was taught how to do his dressing changes and he will complete 2 weeks of Augmentin and doxycycline. I have also referred him to outpatient wound clinic in addition to outpatient primary care physician follow-up.  Acute kidney injury due to ATN superimposed on CKD stage 2-resolved and back to baseline. Scr 1.62 >> 1.39 >> 2.08 >> 1.67 (baseline 0.92 six months ago) CrCl 61 >> 40 >> 50 mL/min It was due to ATN as well as a multifactorial process including diuretics, hypoperfusion due to persistent hypotension and the vancomycin/Zosyn combination. Renal ultrasound showed no etiology for his renal failure.   Lab Results  Component Value Date   HGBA1C 7.3 (H) 12/13/2022   I do not see an A1c level from recent hospital admission  Patient Active Problem List   Diagnosis Date Noted   Primary osteoarthritis of right knee 03/03/2020   Chronic gout 03/09/2016   Hypertriglyceridemia 01/16/2015   Type 2 diabetes mellitus (HCC) 01/06/2015   CAD in native artery 01/06/2015   History of coronary artery stent placement 01/06/2015   PAD (peripheral artery disease) (HCC) 01/06/2015   Hyperlipidemia 01/06/2015   Essential hypertension 01/06/2015    Past Medical History:  Diagnosis Date   CAD (coronary artery disease)    Cancer (HCC)    basal cell excision upper right ear 7 yrs ago   Complication of anesthesia    bad pain with anesthesia induction with colonscopy 6 yrs ago   Diabetes mellitus without complication (HCC)    type 2   GERD (gastroesophageal reflux disease)  Hyperlipidemia    Hypertension    Peripheral vascular disease (HCC)    partial clogged artery right leg   Sleep apnea    yrs ago sleep study no cpap used    Past Surgical History:  Procedure Laterality Date   ABDOMINAL AORTOGRAM W/LOWER EXTREMITY N/A 12/27/2016   Procedure: Abdominal Aortogram w/Lower Extremity;  Surgeon: David Hint, MD;  Location: [facility] INVASIVE CV LAB;   Service: Cardiovascular;  Laterality: N/A;   CARDIAC SURGERY     CORONARY ANGIOPLASTY WITH STENT PLACEMENT  11/2004   in [place]   MENISCUS REPAIR Right    NASAL SINUS SURGERY  1976   TONSILLECTOMY      Social History   Tobacco Use   Smoking status: Former    Packs/day: 1.50    Years: 26.00    Additional pack years: 0.00    Total pack years: 39.00    Types: Cigarettes    Quit date: 1994    Years since quitting: 30.3   Smokeless tobacco: Former  Building services engineer Use: Never used  Substance Use Topics   Alcohol use: Yes    Alcohol/week: 0.0 standard drinks of alcohol    Comment: 3 ounces on monday   Drug use: No    Family History  Problem Relation Age of Onset   Alcohol abuse Brother    Cancer Brother    Diabetes Brother    Hyperlipidemia Brother    Hypertension Brother    Diabetes Mother    Hypertension Mother    Hyperlipidemia Mother    Heart attack Mother    Hyperlipidemia Father    Hypertension Father     Allergies  Allergen Reactions   Dexilant [Dexlansoprazole]     Pressure in stomach and chest    Medication list has been reviewed and updated.  Current Outpatient Medications on File Prior to Visit  Medication Sig Dispense Refill   amLODipine (NORVASC) 10 MG tablet TAKE 1 TABLET BY MOUTH DAILY (Patient not taking: Reported on 03/04/2023) 100 tablet 2   amoxicillin-clavulanate (AUGMENTIN) 125-31.25 MG/5ML suspension Take 125 mg by mouth 2 (two) times daily. For dog scratch- UE cellulitis- take for 14 days per hospital discharging provider at [facility]     aspirin 81 MG tablet Take 81 mg by mouth daily.     busPIRone (BUSPAR) 15 MG tablet Take 1 tablet (15 mg total) by mouth 2 (two) times daily. 180 tablet 1   carvedilol (COREG) 25 MG tablet TAKE 1 TABLET BY MOUTH TWICE  DAILY 200 tablet 2   Cholecalciferol (VITAMIN D3) 2000 units TABS Take 2,000 Units by mouth daily.     cloNIDine (CATAPRES) 0.2 MG tablet TAKE 1 TABLET BY MOUTH TWICE  DAILY 200 tablet 2    Coenzyme Q10 300 MG CAPS Take 300 mg by mouth daily.      colchicine 0.6 MG tablet Take 1 tablet (0.6 mg total) by mouth daily. Take once for gout- hold the rest for later use 20 tablet 0   Continuous Blood Gluc Receiver (FREESTYLE LIBRE 2 READER) DEVI Use for continuous blood glucose monitoring.  Patient is having symptoms of hypoglycemia 2 each 6   Continuous Blood Gluc Sensor (FREESTYLE LIBRE 2 SENSOR) MISC 1 Device by Does not apply route daily. 2 each 6   Continuous Blood Gluc Sensor (FREESTYLE LIBRE 2 SENSOR) MISC Use for continuous blood glucose monitoring.  Patient is having symptoms of hypoglycemia 2 each 6   Continuous Blood Gluc Sensor (FREESTYLE LIBRE  3 SENSOR) MISC Place 1 sensor on the skin every 14 days. Use to check glucose continuously 2 each 6   Cyanocobalamin (VITAMIN B-12 PO) Take 2,500 mg by mouth daily.     diphenoxylate-atropine (LOMOTIL) 2.5-0.025 MG tablet Take 1 tablet by mouth every 6 (six) hours as needed for diarrhea or loose stools. 60 tablet 1   doxycycline (VIBRA-TABS) 100 MG tablet Take 100 mg by mouth 2 (two) times daily. Take for 14 days- per instructions of [facility] hospital discharging provider     glucose blood (FREESTYLE LITE) test strip Check blood sugar 1-3x daily 100 each 12   Lancets (FREESTYLE) lancets Check blood sugar once daily 100 each 12   lisinopril (ZESTRIL) 10 MG tablet TAKE 1-2 TABLETS (10-20 MG TOTAL) BY MOUTH DAILY. 180 tablet 1   meclizine (ANTIVERT) 25 MG tablet Take 1 tablet (25 mg total) by mouth 3 (three) times daily as needed for dizziness. 30 tablet 0   meloxicam (MOBIC) 15 MG tablet Take 1 tablet (15 mg total) by mouth daily as needed for pain. 90 tablet 1   metFORMIN (GLUCOPHAGE) 1000 MG tablet Take 1 tablet (1,000 mg total) by mouth 2 (two) times daily with a meal. 180 tablet 1   MULTIPLE VITAMIN PO Take 1 tablet by mouth daily.      multivitamin (RENA-VIT) TABS tablet Take 1 tablet by mouth daily. 03/04/23: reports started by  [facility] hospital discharge provider on 03/03/23     niacin 500 MG tablet Take 500 mg by mouth every morning.      ondansetron (ZOFRAN-ODT) 4 MG disintegrating tablet Take 1 tablet (4 mg total) by mouth every 8 (eight) hours as needed. 12 tablet 1   pantoprazole (PROTONIX) 40 MG tablet TAKE 1 TABLET BY MOUTH DAILY 100 tablet 2   rosuvastatin (CRESTOR) 40 MG tablet TAKE 1 TABLET BY MOUTH DAILY 100 tablet 2   spironolactone (ALDACTONE) 25 MG tablet TAKE 1 TABLET BY MOUTH DAILY 30 tablet 11   tamsulosin (FLOMAX) 0.4 MG CAPS capsule TAKE 1 CAPSULE EVERY DAY MAY INCREASE TO 2 AFTER 2 WEEKS IF NEED FOR PROSTATE SYMPTOMS 180 capsule 1   tirzepatide (MOUNJARO) 2.5 MG/0.5ML Pen INJECT 2.5 MG SUBCUTANEOUSLY WEEKLY 2 mL 2   tirzepatide (MOUNJARO) 2.5 MG/0.5ML Pen Inject 2.5 mg into the skin once a week. 2 mL 1   valACYclovir (VALTREX) 1000 MG tablet Take 1 tablet (1,000 mg total) by mouth 3 (three) times daily. (Patient not taking: Reported on 03/04/2023) 21 tablet 0   No current facility-administered medications on file prior to visit.    Review of Systems:  As per HPI- otherwise negative.   Physical Examination: There were no vitals filed for this visit. There were no vitals filed for this visit. There is no height or weight on file to calculate BMI. Ideal Body Weight:    GEN: no acute distress. HEENT: Atraumatic, Normocephalic.  Ears and Nose: No external deformity. CV: RRR, No M/G/R. No JVD. No thrill. No extra heart sounds. PULM: CTA B, no wheezes, crackles, rhonchi. No retractions. No resp. distress. No accessory muscle use. ABD: S, NT, ND, +BS. No rebound. No HSM. EXTR: No c/c/e PSYCH: Normally interactive. Conversant.    Assessment and Plan: ***  Signed Abbe Amsterdam, MD

## 2023-03-09 ENCOUNTER — Ambulatory Visit (INDEPENDENT_AMBULATORY_CARE_PROVIDER_SITE_OTHER): Payer: Medicare Other | Admitting: Family Medicine

## 2023-03-09 ENCOUNTER — Encounter: Payer: Self-pay | Admitting: Family Medicine

## 2023-03-09 ENCOUNTER — Ambulatory Visit (HOSPITAL_BASED_OUTPATIENT_CLINIC_OR_DEPARTMENT_OTHER)
Admission: RE | Admit: 2023-03-09 | Discharge: 2023-03-09 | Disposition: A | Discharge status: Home / Self Care | Payer: Medicare Other | Source: Ambulatory Visit | Attending: Family Medicine | Admitting: Family Medicine

## 2023-03-09 VITALS — BP 98/58 | HR 80 | Temp 98.0°F | Resp 18 | Ht 69.0 in | Wt 243.4 lb

## 2023-03-09 DIAGNOSIS — E1159 Type 2 diabetes mellitus with other circulatory complications: Secondary | ICD-10-CM | POA: Diagnosis not present

## 2023-03-09 DIAGNOSIS — Z7985 Long-term (current) use of injectable non-insulin antidiabetic drugs: Secondary | ICD-10-CM | POA: Diagnosis not present

## 2023-03-09 DIAGNOSIS — I952 Hypotension due to drugs: Secondary | ICD-10-CM

## 2023-03-09 DIAGNOSIS — M109 Gout, unspecified: Secondary | ICD-10-CM | POA: Diagnosis not present

## 2023-03-09 DIAGNOSIS — L03114 Cellulitis of left upper limb: Secondary | ICD-10-CM | POA: Diagnosis not present

## 2023-03-09 DIAGNOSIS — M7989 Other specified soft tissue disorders: Secondary | ICD-10-CM | POA: Diagnosis not present

## 2023-03-09 LAB — COMPREHENSIVE METABOLIC PANEL
ALT: 23 U/L (ref 0–53)
AST: 19 U/L (ref 0–37)
Albumin: 3.4 g/dL — ABNORMAL LOW (ref 3.5–5.2)
Alkaline Phosphatase: 48 U/L (ref 39–117)
BUN: 9 mg/dL (ref 6–23)
CO2: 28 mEq/L (ref 19–32)
Calcium: 8.9 mg/dL (ref 8.4–10.5)
Chloride: 101 mEq/L (ref 96–112)
Creatinine, Ser: 1.04 mg/dL (ref 0.40–1.50)
GFR: 72.39 mL/min (ref >60.00)
Glucose, Bld: 102 mg/dL — ABNORMAL HIGH (ref 70–99)
Potassium: 4 mEq/L (ref 3.5–5.1)
Sodium: 138 mEq/L (ref 135–145)
Total Bilirubin: 0.4 mg/dL (ref 0.2–1.2)
Total Protein: 6.3 g/dL (ref 6.0–8.3)

## 2023-03-09 LAB — CBC
HCT: 34.1 % — ABNORMAL LOW (ref 39.0–52.0)
Hemoglobin: 11 g/dL — ABNORMAL LOW (ref 13.0–17.0)
MCHC: 32.4 g/dL (ref 30.0–36.0)
MCV: 84 fl (ref 78.0–100.0)
Platelets: 362 10*3/uL (ref 150.0–400.0)
RBC: 4.06 Mil/uL — ABNORMAL LOW (ref 4.22–5.81)
RDW: 17.8 % — ABNORMAL HIGH (ref 11.5–15.5)
WBC: 8.1 10*3/uL (ref 4.0–10.5)

## 2023-03-09 LAB — URIC ACID: Uric Acid, Serum: 4 mg/dL (ref 4.0–7.8)

## 2023-03-09 LAB — HEMOGLOBIN A1C: Hgb A1c MFr Bld: 7.1 % — ABNORMAL HIGH (ref 4.6–6.5)

## 2023-03-09 MED ORDER — ALLOPURINOL 100 MG PO TABS: 100.0000 mg | ORAL_TABLET | Freq: Every day | ORAL | 6 refills | Status: DC

## 2023-03-09 NOTE — Patient Instructions (Addendum)
Hold amlodipine for now- please let me now how your BP looks while holding this medication We can scale back on other blood pressure medications as well if needed I will be in touch with your labs and x-ray asap   For gout flare-continue Tylenol as needed.  I will get an x-ray to make sure no sign of anything else going on with the toe.  Let's use allopurinol 100 mg daily to see if that will help.  We may need to resort to using prednisone if not improving soon  Infection of the left arm appears to be healing.  Continue to use a nonstick bandage to cover the healing area until it is thoroughly scabbed over.  Okay to leave it open to the air for couple of hours while you are at home. Let me know if is not continuing to get better

## 2023-03-14 ENCOUNTER — Encounter: Payer: Self-pay | Admitting: Family Medicine

## 2023-03-16 ENCOUNTER — Ambulatory Visit: Payer: Medicare Other | Admitting: Family Medicine

## 2023-03-21 ENCOUNTER — Other Ambulatory Visit: Payer: Self-pay | Admitting: Family Medicine

## 2023-03-21 ENCOUNTER — Other Ambulatory Visit: Payer: Self-pay | Admitting: Cardiology

## 2023-03-21 DIAGNOSIS — E119 Type 2 diabetes mellitus without complications: Secondary | ICD-10-CM

## 2023-03-22 ENCOUNTER — Other Ambulatory Visit: Payer: Self-pay | Admitting: Family Medicine

## 2023-03-22 DIAGNOSIS — F411 Generalized anxiety disorder: Secondary | ICD-10-CM

## 2023-03-24 DIAGNOSIS — M1711 Unilateral primary osteoarthritis, right knee: Secondary | ICD-10-CM | POA: Diagnosis not present

## 2023-03-29 DIAGNOSIS — H903 Sensorineural hearing loss, bilateral: Secondary | ICD-10-CM | POA: Diagnosis not present

## 2023-04-06 ENCOUNTER — Encounter: Payer: Self-pay | Admitting: Family Medicine

## 2023-04-21 ENCOUNTER — Encounter (INDEPENDENT_AMBULATORY_CARE_PROVIDER_SITE_OTHER): Payer: Medicare Other | Admitting: Family Medicine

## 2023-04-21 DIAGNOSIS — I10 Essential (primary) hypertension: Secondary | ICD-10-CM

## 2023-04-22 DIAGNOSIS — I10 Essential (primary) hypertension: Secondary | ICD-10-CM | POA: Diagnosis not present

## 2023-04-22 NOTE — Telephone Encounter (Signed)
Please see the MyChart message reply(ies) for my assessment and plan.  The patient gave consent for this Medical Advice Message and is aware that it may result in a bill to their insurance company as well as the possibility that this may result in a co-payment or deductible. They are an established patient, but are not seeking medical advice exclusively about a problem treated during an in person or video visit in the last 7 days. I did not recommend an in person or video visit within 7 days of my reply.  I spent a total of 12 minutes cumulative time within 7 days through MyChart messaging Trinidee Schrag, MD  

## 2023-05-02 DIAGNOSIS — M1711 Unilateral primary osteoarthritis, right knee: Secondary | ICD-10-CM | POA: Diagnosis not present

## 2023-05-08 ENCOUNTER — Encounter: Payer: Self-pay | Admitting: Family Medicine

## 2023-05-08 DIAGNOSIS — E1159 Type 2 diabetes mellitus with other circulatory complications: Secondary | ICD-10-CM

## 2023-05-08 DIAGNOSIS — E785 Hyperlipidemia, unspecified: Secondary | ICD-10-CM

## 2023-05-09 MED ORDER — TIRZEPATIDE 5 MG/0.5ML ~~LOC~~ SOAJ
5.0000 mg | SUBCUTANEOUS | 0 refills | Status: DC
Start: 2023-05-09 — End: 2023-05-19

## 2023-05-13 DIAGNOSIS — H9121 Sudden idiopathic hearing loss, right ear: Secondary | ICD-10-CM | POA: Diagnosis not present

## 2023-05-13 DIAGNOSIS — H903 Sensorineural hearing loss, bilateral: Secondary | ICD-10-CM | POA: Diagnosis not present

## 2023-05-13 DIAGNOSIS — H6122 Impacted cerumen, left ear: Secondary | ICD-10-CM | POA: Diagnosis not present

## 2023-05-14 NOTE — Progress Notes (Addendum)
[facility] [address], [address] [place], [place] [address] [phone]  Date:  05/19/2023   Name:  David Singh   DOB:  [birth year]   MRN:  [id number]  PCP:  Pearline Cables, MD    Chief Complaint: Annual Exam (Concerns/ questions: deaf in the R ear. L ear is 76%/Shingrix due- Medicare pt/Foot exam, DM urine due)   History of Present Illness:  David Singh is a 72 y.o. very pleasant male patient who presents with the following:  Patient seen today for physical exam Most recent visit with myself was in May-at that time he had recently been admitted with sepsis and left arm cellulitis- this is now resolved  History of diabetes, CAD with stent, hyperlipidemia, hypertension, significant peripheral arterial disease He notes his PAD is stable.  He can walk about the same distance before he gets pain  Last visit he was recovering from cellulitis, he finished up his antibiotics His blood pressure was low at our last visit, potentially due to recent illness.  We had him hold amlodipine temporarily, his blood pressure did eventually come back up and he is now back on amlodipine as well as lisinopril  He was seen by [facility] last week for sudden hearing loss - he acutally plans to get a cochlear implant as the right ear is totally deaf, the left ear 75% deaf  Shingirx Foot exam due Urine micro due   We tried to get him on Monjaro but it was too expensive- he was able to use it for about 4 months but off and on- no longer using We will try Trulicity and see if his insurnace may cover   Lab Results  Component Value Date   HGBA1C 7.1 (H) 03/09/2023     Patient Active Problem List   Diagnosis Date Noted   Primary osteoarthritis of right knee 03/03/2020   Chronic gout 03/09/2016   Hypertriglyceridemia 01/16/2015   Type 2 diabetes mellitus (HCC) 01/06/2015   CAD in native artery 01/06/2015   History of  coronary artery stent placement 01/06/2015   PAD (peripheral artery disease) (HCC) 01/06/2015   Hyperlipidemia 01/06/2015   Essential hypertension 01/06/2015    Past Medical History:  Diagnosis Date   CAD (coronary artery disease)    Cancer (HCC)    basal cell excision upper right ear 7 yrs ago   Complication of anesthesia    bad pain with anesthesia induction with colonscopy 6 yrs ago   Diabetes mellitus without complication (HCC)    type 2   GERD (gastroesophageal reflux disease)    Hyperlipidemia    Hypertension    Peripheral vascular disease (HCC)    partial clogged artery right leg   Sleep apnea    yrs ago sleep study no cpap used    Past Surgical History:  Procedure Laterality Date   ABDOMINAL AORTOGRAM W/LOWER EXTREMITY N/A 12/27/2016   Procedure: Abdominal Aortogram w/Lower Extremity;  Surgeon: Chuck Hint, MD;  [facility] INVASIVE CV LAB;  Service: Cardiovascular;  Laterality: N/A;   CARDIAC SURGERY     CORONARY ANGIOPLASTY WITH STENT PLACEMENT  11/2004   in [place]   MENISCUS REPAIR Right    NASAL SINUS SURGERY  1976   TONSILLECTOMY      Social History   Tobacco Use   Smoking status: Former    Current packs/day: 0.00    Average packs/day: 1.5 packs/day for 26.0 years (39.0  ttl pk-yrs)    Types: Cigarettes    Start date: 5    Quit date: 1994    Years since quitting: 30.5   Smokeless tobacco: Former  Building services engineer status: Never Used  Substance Use Topics   Alcohol use: Yes    Alcohol/week: 0.0 standard drinks of alcohol    Comment: 3 ounces on monday   Drug use: No    Family History  Problem Relation Age of Onset   Alcohol abuse Brother    Cancer Brother    Diabetes Brother    Hyperlipidemia Brother    Hypertension Brother    Diabetes Mother    Hypertension Mother    Hyperlipidemia Mother    Heart attack Mother    Hyperlipidemia Father    Hypertension Father     Allergies  Allergen Reactions   Dexilant  [Dexlansoprazole]     Pressure in stomach and chest    Medication list has been reviewed and updated.  Current Outpatient Medications on File Prior to Visit  Medication Sig Dispense Refill   aspirin 81 MG tablet Take 81 mg by mouth daily.     busPIRone (BUSPAR) 15 MG tablet TAKE 1 TABLET BY MOUTH TWICE  DAILY 180 tablet 0   carvedilol (COREG) 25 MG tablet TAKE 1 TABLET BY MOUTH TWICE  DAILY 200 tablet 2   Cholecalciferol (VITAMIN D3) 2000 units TABS Take 2,000 Units by mouth daily.     cloNIDine (CATAPRES) 0.2 MG tablet TAKE 1 TABLET BY MOUTH TWICE  DAILY 200 tablet 2   Coenzyme Q10 300 MG CAPS Take 300 mg by mouth daily.      Cyanocobalamin (VITAMIN B-12 PO) Take 2,500 mg by mouth daily.     glucose blood (FREESTYLE LITE) test strip Check blood sugar 1-3x daily 100 each 12   lisinopril (ZESTRIL) 10 MG tablet TAKE 1-2 TABLETS (10-20 MG TOTAL) BY MOUTH DAILY. 180 tablet 1   meloxicam (MOBIC) 15 MG tablet Take 1 tablet (15 mg total) by mouth daily as needed for pain. 90 tablet 1   metFORMIN (GLUCOPHAGE) 1000 MG tablet TAKE 1 TABLET BY MOUTH TWICE  DAILY WITH A MEAL 200 tablet 2   MULTIPLE VITAMIN PO Take 1 tablet by mouth daily.      multivitamin (RENA-VIT) TABS tablet Take 1 tablet by mouth daily. 03/04/23: reports started by [facility] hospital discharge provider on 03/03/23     niacin 500 MG tablet Take 500 mg by mouth every morning.      pantoprazole (PROTONIX) 40 MG tablet TAKE 1 TABLET BY MOUTH DAILY 100 tablet 2   rosuvastatin (CRESTOR) 40 MG tablet TAKE 1 TABLET BY MOUTH DAILY 100 tablet 2   spironolactone (ALDACTONE) 25 MG tablet Take 1 tablet (25 mg total) by mouth daily. NEED OV. 100 tablet 0   allopurinol (ZYLOPRIM) 100 MG tablet Take 1 tablet (100 mg total) by mouth daily. Increase as directed by MD 60 tablet 6   amLODipine (NORVASC) 10 MG tablet TAKE 1 TABLET BY MOUTH DAILY 100 tablet 2   colchicine 0.6 MG tablet Take 1 tablet (0.6 mg total) by mouth daily. Take once for gout- hold  the rest for later use 20 tablet 0   Continuous Blood Gluc Receiver (FREESTYLE LIBRE 2 READER) DEVI Use for continuous blood glucose monitoring.  Patient is having symptoms of hypoglycemia 2 each 6   Continuous Blood Gluc Sensor (FREESTYLE LIBRE 2 SENSOR) MISC 1 Device by Does not apply route daily. 2  each 6   Continuous Blood Gluc Sensor (FREESTYLE LIBRE 2 SENSOR) MISC Use for continuous blood glucose monitoring.  Patient is having symptoms of hypoglycemia 2 each 6   Continuous Blood Gluc Sensor (FREESTYLE LIBRE 3 SENSOR) MISC Place 1 sensor on the skin every 14 days. Use to check glucose continuously 2 each 6   diphenoxylate-atropine (LOMOTIL) 2.5-0.025 MG tablet Take 1 tablet by mouth every 6 (six) hours as needed for diarrhea or loose stools. 60 tablet 1   Lancets (FREESTYLE) lancets Check blood sugar once daily 100 each 12   ondansetron (ZOFRAN-ODT) 4 MG disintegrating tablet Take 1 tablet (4 mg total) by mouth every 8 (eight) hours as needed. 12 tablet 1   tirzepatide (MOUNJARO) 5 MG/0.5ML Pen Inject 5 mg into the skin once a week. 6 mL 0   valACYclovir (VALTREX) 1000 MG tablet Take 1 tablet (1,000 mg total) by mouth 3 (three) times daily. 21 tablet 0   No current facility-administered medications on file prior to visit.    Review of Systems:  As per HPI- otherwise negative.   Physical Examination: Vitals:   05/19/23 1315  BP: 112/62  Pulse: 68  Resp: 18  Temp: 97.7 F (36.5 C)  SpO2: 95%   Vitals:   05/19/23 1315  Weight: 240 lb 12.8 oz (109.2 kg)  Height: 5\' 9"  (1.753 m)   Body mass index is 35.56 kg/m. Ideal Body Weight: Weight in (lb) to have BMI = 25: 168.9  GEN: no acute distress.  Obese, looks well otherwise, hard of hearing HEENT: Atraumatic, Normocephalic.  Ears and Nose: No external deformity. CV: RRR, No M/G/R. No JVD. No thrill. No extra heart sounds. PULM: CTA B, no wheezes, crackles, rhonchi. No retractions. No resp. distress. No accessory muscle  use. ABD: S, NT, ND No rebound. No HSM. EXTR: No c/c/e PSYCH: Normally interactive. Conversant.  Foot exam - no pulses, pes planus. Normal sensation   Assessment and Plan: Physical exam  Type 2 diabetes mellitus with other circulatory complication, without long-term current use of insulin (HCC) - Plan: Microalbumin / creatinine urine ratio, Dulaglutide (TRULICITY) 0.75 MG/0.5ML SOPN  Dyslipidemia  Essential hypertension  Gouty arthritis of right great toe  Special screening, prostate cancer - Plan: PSA  Screening for colon cancer - Plan: Cologuard  Patient seen today for physical exam.  Encouraged healthy diet and exercise routine Lab work is pending as above He was having good results with Mounjaro, but it is now too expensive due to an insurance change.  Prescribed Trulicity for him to try instead, asked him to let me know if he is able to get this filled  Ordered Cologuard, which is due in September  Will plan further follow- up pending labs.  Signed Abbe Amsterdam, MD  Addendum 7/26, received labs as below.  Message to patient Results for orders placed or performed in visit on 05/19/23  Microalbumin / creatinine urine ratio  Result Value Ref Range   Microalb, Ur <0.7 0.0 - 1.9 mg/dL   Creatinine,U 409.8 mg/dL   Microalb Creat Ratio 0.6 0.0 - 30.0 mg/g  PSA  Result Value Ref Range   PSA 1.56 0.10 - 4.00 ng/mL

## 2023-05-19 ENCOUNTER — Ambulatory Visit (INDEPENDENT_AMBULATORY_CARE_PROVIDER_SITE_OTHER): Payer: Medicare Other | Admitting: Family Medicine

## 2023-05-19 VITALS — BP 112/62 | HR 68 | Temp 97.7°F | Resp 18 | Ht 69.0 in | Wt 240.8 lb

## 2023-05-19 DIAGNOSIS — Z0000 Encounter for general adult medical examination without abnormal findings: Secondary | ICD-10-CM

## 2023-05-19 DIAGNOSIS — I10 Essential (primary) hypertension: Secondary | ICD-10-CM | POA: Diagnosis not present

## 2023-05-19 DIAGNOSIS — M109 Gout, unspecified: Secondary | ICD-10-CM | POA: Diagnosis not present

## 2023-05-19 DIAGNOSIS — Z1211 Encounter for screening for malignant neoplasm of colon: Secondary | ICD-10-CM

## 2023-05-19 DIAGNOSIS — E785 Hyperlipidemia, unspecified: Secondary | ICD-10-CM | POA: Diagnosis not present

## 2023-05-19 DIAGNOSIS — Z125 Encounter for screening for malignant neoplasm of prostate: Secondary | ICD-10-CM

## 2023-05-19 DIAGNOSIS — E1159 Type 2 diabetes mellitus with other circulatory complications: Secondary | ICD-10-CM | POA: Diagnosis not present

## 2023-05-19 MED ORDER — TRULICITY 0.75 MG/0.5ML ~~LOC~~ SOAJ: 0.7500 mg | SUBCUTANEOUS | 2 refills | Status: DC

## 2023-05-19 NOTE — Patient Instructions (Signed)
It was good to see you today, I will be in touch with your labs soon as possible.  Because David Singh is too expensive, lets have you try filling a prescription for Trulicity.  Please let me know if you are able to get this prescription filled.  We can go up on the dose as needed and tolerated  Otherwise, please see me in 4 to 6 months to check on your other lab work and diabetes

## 2023-05-20 ENCOUNTER — Encounter: Payer: Self-pay | Admitting: Family Medicine

## 2023-05-23 DIAGNOSIS — M1711 Unilateral primary osteoarthritis, right knee: Secondary | ICD-10-CM | POA: Diagnosis not present

## 2023-05-30 ENCOUNTER — Ambulatory Visit: Payer: Medicare Other | Admitting: Family Medicine

## 2023-06-28 DIAGNOSIS — I2510 Atherosclerotic heart disease of native coronary artery without angina pectoris: Secondary | ICD-10-CM | POA: Diagnosis not present

## 2023-06-28 DIAGNOSIS — E119 Type 2 diabetes mellitus without complications: Secondary | ICD-10-CM | POA: Diagnosis not present

## 2023-06-28 DIAGNOSIS — I10 Essential (primary) hypertension: Secondary | ICD-10-CM | POA: Diagnosis not present

## 2023-06-28 DIAGNOSIS — R42 Dizziness and giddiness: Secondary | ICD-10-CM | POA: Diagnosis not present

## 2023-06-28 DIAGNOSIS — Z7984 Long term (current) use of oral hypoglycemic drugs: Secondary | ICD-10-CM | POA: Diagnosis not present

## 2023-06-28 DIAGNOSIS — Z87891 Personal history of nicotine dependence: Secondary | ICD-10-CM | POA: Diagnosis not present

## 2023-06-28 DIAGNOSIS — H9121 Sudden idiopathic hearing loss, right ear: Secondary | ICD-10-CM | POA: Diagnosis not present

## 2023-06-28 DIAGNOSIS — Z974 Presence of external hearing-aid: Secondary | ICD-10-CM | POA: Diagnosis not present

## 2023-06-28 DIAGNOSIS — G4733 Obstructive sleep apnea (adult) (pediatric): Secondary | ICD-10-CM | POA: Diagnosis not present

## 2023-06-28 DIAGNOSIS — Z7982 Long term (current) use of aspirin: Secondary | ICD-10-CM | POA: Diagnosis not present

## 2023-06-28 DIAGNOSIS — H903 Sensorineural hearing loss, bilateral: Secondary | ICD-10-CM | POA: Diagnosis not present

## 2023-07-08 DIAGNOSIS — I739 Peripheral vascular disease, unspecified: Secondary | ICD-10-CM | POA: Diagnosis not present

## 2023-07-08 DIAGNOSIS — Z01810 Encounter for preprocedural cardiovascular examination: Secondary | ICD-10-CM | POA: Diagnosis not present

## 2023-07-08 DIAGNOSIS — R0602 Shortness of breath: Secondary | ICD-10-CM | POA: Diagnosis not present

## 2023-07-08 DIAGNOSIS — E785 Hyperlipidemia, unspecified: Secondary | ICD-10-CM | POA: Diagnosis not present

## 2023-07-08 DIAGNOSIS — I2510 Atherosclerotic heart disease of native coronary artery without angina pectoris: Secondary | ICD-10-CM | POA: Diagnosis not present

## 2023-07-08 DIAGNOSIS — I10 Essential (primary) hypertension: Secondary | ICD-10-CM | POA: Diagnosis not present

## 2023-07-15 DIAGNOSIS — Z1211 Encounter for screening for malignant neoplasm of colon: Secondary | ICD-10-CM | POA: Diagnosis not present

## 2023-07-21 DIAGNOSIS — M1711 Unilateral primary osteoarthritis, right knee: Secondary | ICD-10-CM | POA: Diagnosis not present

## 2023-07-28 ENCOUNTER — Encounter: Payer: Self-pay | Admitting: Family Medicine

## 2023-07-28 LAB — COLOGUARD: COLOGUARD: NEGATIVE

## 2023-08-03 DIAGNOSIS — H9191 Unspecified hearing loss, right ear: Secondary | ICD-10-CM | POA: Diagnosis not present

## 2023-08-03 DIAGNOSIS — Z45321 Encounter for adjustment and management of cochlear device: Secondary | ICD-10-CM | POA: Diagnosis not present

## 2023-08-03 DIAGNOSIS — Z9621 Cochlear implant status: Secondary | ICD-10-CM | POA: Diagnosis not present

## 2023-08-03 DIAGNOSIS — R9082 White matter disease, unspecified: Secondary | ICD-10-CM | POA: Diagnosis not present

## 2023-08-03 DIAGNOSIS — H903 Sensorineural hearing loss, bilateral: Secondary | ICD-10-CM | POA: Diagnosis not present

## 2023-08-03 DIAGNOSIS — G319 Degenerative disease of nervous system, unspecified: Secondary | ICD-10-CM | POA: Diagnosis not present

## 2023-08-05 DIAGNOSIS — M1711 Unilateral primary osteoarthritis, right knee: Secondary | ICD-10-CM | POA: Diagnosis not present

## 2023-08-08 ENCOUNTER — Telehealth: Payer: Self-pay | Admitting: Family Medicine

## 2023-08-08 NOTE — Telephone Encounter (Signed)
Copied from CRM [id number]. Topic: Medicare AWV >> Aug 08, 2023  1:30 PM Payton Doughty wrote: Reason for CRM: Called LVM 08/08/2023 to schedule Annual Wellness Visit  Verlee Rossetti; Care Guide Ambulatory Clinical Support [place] [facility] Direct Dial: [phone]

## 2023-08-09 NOTE — Progress Notes (Unsigned)
[facility] [address], [address] [place], [place] [address] [phone]  Date:  08/10/2023   Name:  David Singh   DOB:  February 16, 1951   MRN:  [id number]  PCP:  Pearline Cables, MD    Chief Complaint: No chief complaint on file.   History of Present Illness:  David Singh is a 72 y.o. very pleasant male patient who presents with the following:  Patient seen today for follow-up Most recent visit with myself was in July for his physical History of diabetes, CAD with stent, hyperlipidemia, hypertension, significant peripheral arterial disease He notes his PAD is stable.  He can walk about the same distance before he gets pain  Audiology is seeing him for sensorineural hearing loss  Shingrix Flu vaccine Has declined all COVID vaccines  Lab Results  Component Value Date   HGBA1C 7.1 (H) 03/09/2023   Allopurinol Amlodipine Aspirin Buspirone Carvedilol Clonidine Colchicine Trulicity 0.75 Lisinopril Metformin at thousand twice daily Crestor Spironolactone Patient Active Problem List   Diagnosis Date Noted   Primary osteoarthritis of right knee 03/03/2020   Chronic gout 03/09/2016   Hypertriglyceridemia 01/16/2015   Type 2 diabetes mellitus (HCC) 01/06/2015   CAD in native artery 01/06/2015   History of coronary artery stent placement 01/06/2015   PAD (peripheral artery disease) (HCC) 01/06/2015   Hyperlipidemia 01/06/2015   Essential hypertension 01/06/2015    Past Medical History:  Diagnosis Date   CAD (coronary artery disease)    Cancer (HCC)    basal cell excision upper right ear 7 yrs ago   Complication of anesthesia    bad pain with anesthesia induction with colonscopy 6 yrs ago   Diabetes mellitus without complication (HCC)    type 2   GERD (gastroesophageal reflux disease)    Hyperlipidemia    Hypertension    Peripheral vascular disease (HCC)    partial clogged artery  right leg   Sleep apnea    yrs ago sleep study no cpap used    Past Surgical History:  Procedure Laterality Date   ABDOMINAL AORTOGRAM W/LOWER EXTREMITY N/A 12/27/2016   Procedure: Abdominal Aortogram w/Lower Extremity;  Surgeon: Chuck Hint, MD;  Location: [facility] INVASIVE CV LAB;  Service: Cardiovascular;  Laterality: N/A;   CARDIAC SURGERY     CORONARY ANGIOPLASTY WITH STENT PLACEMENT  11/2004   in [place]   MENISCUS REPAIR Right    NASAL SINUS SURGERY  1976   TONSILLECTOMY      Social History   Tobacco Use   Smoking status: Former    Current packs/day: 0.00    Average packs/day: 1.5 packs/day for 26.0 years (39.0 ttl pk-yrs)    Types: Cigarettes    Start date: 23    Quit date: 1994    Years since quitting: 30.8   Smokeless tobacco: Former  Building services engineer status: Never Used  Substance Use Topics   Alcohol use: Yes    Alcohol/week: 0.0 standard drinks of alcohol    Comment: 3 ounces on monday   Drug use: No    Family History  Problem Relation Age of Onset   Alcohol abuse Brother    Cancer Brother    Diabetes Brother    Hyperlipidemia Brother    Hypertension Brother    Diabetes Mother    Hypertension Mother    Hyperlipidemia Mother    Heart attack Mother    Hyperlipidemia Father  Hypertension Father     Allergies  Allergen Reactions   Dexilant [Dexlansoprazole]     Pressure in stomach and chest    Medication list has been reviewed and updated.  Current Outpatient Medications on File Prior to Visit  Medication Sig Dispense Refill   allopurinol (ZYLOPRIM) 100 MG tablet Take 1 tablet (100 mg total) by mouth daily. Increase as directed by MD 60 tablet 6   amLODipine (NORVASC) 10 MG tablet TAKE 1 TABLET BY MOUTH DAILY 100 tablet 2   aspirin 81 MG tablet Take 81 mg by mouth daily.     busPIRone (BUSPAR) 15 MG tablet TAKE 1 TABLET BY MOUTH TWICE  DAILY 180 tablet 0   carvedilol (COREG) 25 MG tablet TAKE 1 TABLET BY MOUTH TWICE  DAILY 200 tablet  2   Cholecalciferol (VITAMIN D3) 2000 units TABS Take 2,000 Units by mouth daily.     cloNIDine (CATAPRES) 0.2 MG tablet TAKE 1 TABLET BY MOUTH TWICE  DAILY 200 tablet 2   Coenzyme Q10 300 MG CAPS Take 300 mg by mouth daily.      colchicine 0.6 MG tablet Take 1 tablet (0.6 mg total) by mouth daily. Take once for gout- hold the rest for later use 20 tablet 0   Cyanocobalamin (VITAMIN B-12 PO) Take 2,500 mg by mouth daily.     diphenoxylate-atropine (LOMOTIL) 2.5-0.025 MG tablet Take 1 tablet by mouth every 6 (six) hours as needed for diarrhea or loose stools. 60 tablet 1   Dulaglutide (TRULICITY) 0.75 MG/0.5ML SOPN Inject 0.75 mg into the skin once a week. 2 mL 2   glucose blood (FREESTYLE LITE) test strip Check blood sugar 1-3x daily 100 each 12   lisinopril (ZESTRIL) 10 MG tablet TAKE 1-2 TABLETS (10-20 MG TOTAL) BY MOUTH DAILY. 180 tablet 1   meloxicam (MOBIC) 15 MG tablet Take 1 tablet (15 mg total) by mouth daily as needed for pain. 90 tablet 1   metFORMIN (GLUCOPHAGE) 1000 MG tablet TAKE 1 TABLET BY MOUTH TWICE  DAILY WITH A MEAL 200 tablet 2   MULTIPLE VITAMIN PO Take 1 tablet by mouth daily.      multivitamin (RENA-VIT) TABS tablet Take 1 tablet by mouth daily. 03/04/23: reports started by [facility] hospital discharge provider on 03/03/23     niacin 500 MG tablet Take 500 mg by mouth every morning.      ondansetron (ZOFRAN-ODT) 4 MG disintegrating tablet Take 1 tablet (4 mg total) by mouth every 8 (eight) hours as needed. 12 tablet 1   pantoprazole (PROTONIX) 40 MG tablet TAKE 1 TABLET BY MOUTH DAILY 100 tablet 2   rosuvastatin (CRESTOR) 40 MG tablet TAKE 1 TABLET BY MOUTH DAILY 100 tablet 2   spironolactone (ALDACTONE) 25 MG tablet Take 1 tablet (25 mg total) by mouth daily. NEED OV. 100 tablet 0   valACYclovir (VALTREX) 1000 MG tablet Take 1 tablet (1,000 mg total) by mouth 3 (three) times daily. 21 tablet 0   No current facility-administered medications on file prior to visit.    Review  of Systems:  As per HPI- otherwise negative.   Physical Examination: There were no vitals filed for this visit. There were no vitals filed for this visit. There is no height or weight on file to calculate BMI. Ideal Body Weight:    GEN: no acute distress. HEENT: Atraumatic, Normocephalic.  Ears and Nose: No external deformity. CV: RRR, No M/G/R. No JVD. No thrill. No extra heart sounds. PULM: CTA B, no wheezes,  crackles, rhonchi. No retractions. No resp. distress. No accessory muscle use. ABD: S, NT, ND, +BS. No rebound. No HSM. EXTR: No c/c/e PSYCH: Normally interactive. Conversant.    Assessment and Plan: ***  Signed Abbe Amsterdam, MD

## 2023-08-10 ENCOUNTER — Encounter: Payer: Self-pay | Admitting: Family Medicine

## 2023-08-10 ENCOUNTER — Ambulatory Visit (INDEPENDENT_AMBULATORY_CARE_PROVIDER_SITE_OTHER): Payer: Medicare Other | Admitting: Family Medicine

## 2023-08-10 ENCOUNTER — Ambulatory Visit (HOSPITAL_BASED_OUTPATIENT_CLINIC_OR_DEPARTMENT_OTHER)
Admission: RE | Admit: 2023-08-10 | Discharge: 2023-08-10 | Disposition: A | Discharge status: Home / Self Care | Payer: Medicare Other | Source: Ambulatory Visit | Attending: Family Medicine | Admitting: Family Medicine

## 2023-08-10 VITALS — BP 122/80 | HR 61 | Temp 97.8°F | Resp 18 | Ht 69.0 in | Wt 244.4 lb

## 2023-08-10 DIAGNOSIS — R222 Localized swelling, mass and lump, trunk: Secondary | ICD-10-CM

## 2023-08-10 DIAGNOSIS — Z01810 Encounter for preprocedural cardiovascular examination: Secondary | ICD-10-CM | POA: Diagnosis not present

## 2023-08-10 DIAGNOSIS — D509 Iron deficiency anemia, unspecified: Secondary | ICD-10-CM | POA: Diagnosis not present

## 2023-08-10 DIAGNOSIS — E785 Hyperlipidemia, unspecified: Secondary | ICD-10-CM | POA: Diagnosis not present

## 2023-08-10 DIAGNOSIS — I2510 Atherosclerotic heart disease of native coronary artery without angina pectoris: Secondary | ICD-10-CM | POA: Diagnosis not present

## 2023-08-10 DIAGNOSIS — I10 Essential (primary) hypertension: Secondary | ICD-10-CM

## 2023-08-10 DIAGNOSIS — Z862 Personal history of diseases of the blood and blood-forming organs and certain disorders involving the immune mechanism: Secondary | ICD-10-CM | POA: Diagnosis not present

## 2023-08-10 DIAGNOSIS — R0602 Shortness of breath: Secondary | ICD-10-CM | POA: Diagnosis not present

## 2023-08-10 DIAGNOSIS — Z7984 Long term (current) use of oral hypoglycemic drugs: Secondary | ICD-10-CM

## 2023-08-10 DIAGNOSIS — N644 Mastodynia: Secondary | ICD-10-CM | POA: Diagnosis not present

## 2023-08-10 DIAGNOSIS — E1159 Type 2 diabetes mellitus with other circulatory complications: Secondary | ICD-10-CM

## 2023-08-10 DIAGNOSIS — I700 Atherosclerosis of aorta: Secondary | ICD-10-CM | POA: Diagnosis not present

## 2023-08-10 NOTE — Patient Instructions (Signed)
Good to see you today- I will be in touch with your chest x-ray and your labs Please let me know if the nipple pain does not resolve in the next few weeks- we can look further in that case

## 2023-08-11 ENCOUNTER — Encounter: Payer: Self-pay | Admitting: Family Medicine

## 2023-08-11 DIAGNOSIS — Z862 Personal history of diseases of the blood and blood-forming organs and certain disorders involving the immune mechanism: Secondary | ICD-10-CM

## 2023-08-11 LAB — COMPREHENSIVE METABOLIC PANEL
ALT: 21 U/L (ref 0–53)
AST: 19 U/L (ref 0–37)
Albumin: 4.2 g/dL (ref 3.5–5.2)
Alkaline Phosphatase: 45 U/L (ref 39–117)
BUN: 18 mg/dL (ref 6–23)
CO2: 28 meq/L (ref 19–32)
Calcium: 9.7 mg/dL (ref 8.4–10.5)
Chloride: 102 meq/L (ref 96–112)
Creatinine, Ser: 1.19 mg/dL (ref 0.40–1.50)
GFR: 61.4 mL/min (ref >60.00)
Glucose, Bld: 83 mg/dL (ref 70–99)
Potassium: 4.6 meq/L (ref 3.5–5.1)
Sodium: 139 meq/L (ref 135–145)
Total Bilirubin: 0.4 mg/dL (ref 0.2–1.2)
Total Protein: 6.5 g/dL (ref 6.0–8.3)

## 2023-08-11 LAB — CBC
HCT: 37.1 % — ABNORMAL LOW (ref 39.0–52.0)
Hemoglobin: 12.2 g/dL — ABNORMAL LOW (ref 13.0–17.0)
MCHC: 32.8 g/dL (ref 30.0–36.0)
MCV: 87.4 fL (ref 78.0–100.0)
Platelets: 164 10*3/uL (ref 150.0–400.0)
RBC: 4.24 Mil/uL (ref 4.22–5.81)
RDW: 14.3 % (ref 11.5–15.5)
WBC: 5.8 10*3/uL (ref 4.0–10.5)

## 2023-08-11 LAB — FERRITIN: Ferritin: 10.2 ng/mL — ABNORMAL LOW (ref 22.0–322.0)

## 2023-08-11 NOTE — Addendum Note (Signed)
Addended by: Abbe Amsterdam C on: 08/11/2023 12:31 PM   Modules accepted: Orders

## 2023-08-15 NOTE — Addendum Note (Signed)
Addended by: Abbe Amsterdam C on: 08/15/2023 05:11 PM   Modules accepted: Orders

## 2023-08-16 DIAGNOSIS — I739 Peripheral vascular disease, unspecified: Secondary | ICD-10-CM | POA: Diagnosis not present

## 2023-08-18 ENCOUNTER — Ambulatory Visit: Payer: Medicare Other | Admitting: *Deleted

## 2023-08-18 DIAGNOSIS — Z0000 Encounter for general adult medical examination without abnormal findings: Secondary | ICD-10-CM | POA: Diagnosis not present

## 2023-08-18 NOTE — Progress Notes (Signed)
Subjective:   David Singh is a 72 y.o. male who presents for Medicare Annual/Subsequent preventive examination.  Visit Complete: Virtual I connected with  David Singh on 08/18/23 by a audio enabled telemedicine application and verified that I am speaking with the correct person using two identifiers.  Patient Location: Home  Provider Location: Office/Clinic  I discussed the limitations of evaluation and management by telemedicine. The patient expressed understanding and agreed to proceed.  Vital Signs: Because this visit was a virtual/telehealth visit, some criteria may be missing or patient reported. Any vitals not documented were not able to be obtained and vitals that have been documented are patient reported.  Patient Medicare AWV questionnaire was completed by the patient on 08/15/23; I have confirmed that all information answered by patient is correct and no changes since this date.  Cardiac Risk Factors include: advanced age (>8men, >36 women);male gender;diabetes mellitus;dyslipidemia;hypertension;obesity (BMI >30kg/m2)     Objective:    There were no vitals filed for this visit. There is no height or weight on file to calculate BMI.     08/18/2023    3:22 PM 01/19/2023    1:04 PM 08/05/2022    9:04 AM 06/02/2021   11:10 AM 05/30/2020   10:21 AM 11/10/2019    5:38 PM 12/28/2017    2:26 PM  Advanced Directives  Does Patient Have a Medical Advance Directive? Yes No Yes Yes Yes No Yes  Type of Advance Directive Living will  Healthcare Power of [place];Living will Healthcare Power of [place];Living will Healthcare Power of [place];Living will  Healthcare Power of [place];Living will  Does patient want to make changes to medical advance directive? No - Patient declined  No - Patient declined  No - Patient declined    Copy of Healthcare Power of Attorney in Chart?   No - copy requested No - copy requested No - copy requested    Would patient like  information on creating a medical advance directive?      No - Patient declined     Current Medications (verified) Outpatient Encounter Medications as of 08/18/2023  Medication Sig   allopurinol (ZYLOPRIM) 100 MG tablet Take 1 tablet (100 mg total) by mouth daily. Increase as directed by MD   amLODipine (NORVASC) 10 MG tablet TAKE 1 TABLET BY MOUTH DAILY   aspirin 81 MG tablet Take 81 mg by mouth daily.   busPIRone (BUSPAR) 15 MG tablet TAKE 1 TABLET BY MOUTH TWICE  DAILY   carvedilol (COREG) 25 MG tablet TAKE 1 TABLET BY MOUTH TWICE  DAILY   Cholecalciferol (VITAMIN D3) 2000 units TABS Take 2,000 Units by mouth daily.   cloNIDine (CATAPRES) 0.2 MG tablet TAKE 1 TABLET BY MOUTH TWICE  DAILY   Coenzyme Q10 300 MG CAPS Take 300 mg by mouth daily.    Cyanocobalamin (VITAMIN B-12 PO) Take 2,500 mg by mouth daily.   glucose blood (FREESTYLE LITE) test strip Check blood sugar 1-3x daily   lisinopril (ZESTRIL) 10 MG tablet TAKE 1-2 TABLETS (10-20 MG TOTAL) BY MOUTH DAILY.   meloxicam (MOBIC) 15 MG tablet Take 1 tablet (15 mg total) by mouth daily as needed for pain.   metFORMIN (GLUCOPHAGE) 1000 MG tablet TAKE 1 TABLET BY MOUTH TWICE  DAILY WITH A MEAL   MULTIPLE VITAMIN PO Take 1 tablet by mouth daily.    niacin 500 MG tablet Take 500 mg by mouth every morning.    rosuvastatin (CRESTOR) 40 MG tablet TAKE 1 TABLET BY  MOUTH DAILY   spironolactone (ALDACTONE) 25 MG tablet Take 1 tablet (25 mg total) by mouth daily. NEED OV.   [DISCONTINUED] colchicine 0.6 MG tablet Take 1 tablet (0.6 mg total) by mouth daily. Take once for gout- hold the rest for later use   [DISCONTINUED] diphenoxylate-atropine (LOMOTIL) 2.5-0.025 MG tablet Take 1 tablet by mouth every 6 (six) hours as needed for diarrhea or loose stools.   [DISCONTINUED] multivitamin (RENA-VIT) TABS tablet Take 1 tablet by mouth daily. 03/04/23: reports started by [facility] hospital discharge provider on 03/03/23   [DISCONTINUED] ondansetron  (ZOFRAN-ODT) 4 MG disintegrating tablet Take 1 tablet (4 mg total) by mouth every 8 (eight) hours as needed.   [DISCONTINUED] pantoprazole (PROTONIX) 40 MG tablet TAKE 1 TABLET BY MOUTH DAILY   [DISCONTINUED] valACYclovir (VALTREX) 1000 MG tablet Take 1 tablet (1,000 mg total) by mouth 3 (three) times daily.   No facility-administered encounter medications on file as of 08/18/2023.    Allergies (verified) Dexilant [dexlansoprazole]   History: Past Medical History:  Diagnosis Date   Anxiety 1975   Arthritis 2019   CAD (coronary artery disease)    Cancer (HCC)    basal cell excision upper right ear 7 yrs ago   Cataract 2020   Not bad enough to remove!   Complication of anesthesia    bad pain with anesthesia induction with colonscopy 6 yrs ago   Diabetes mellitus without complication (HCC)    type 2   GERD (gastroesophageal reflux disease)    Hyperlipidemia    Hypertension    Peripheral vascular disease (HCC)    partial clogged artery right leg   Sleep apnea    yrs ago sleep study no cpap used   Past Surgical History:  Procedure Laterality Date   ABDOMINAL AORTOGRAM W/LOWER EXTREMITY N/A 12/27/2016   Procedure: Abdominal Aortogram w/Lower Extremity;  Surgeon: Chuck Hint, MD;  Location: [facility] INVASIVE CV LAB;  Service: Cardiovascular;  Laterality: N/A;   CARDIAC SURGERY     CORONARY ANGIOPLASTY WITH STENT PLACEMENT  11/2004   in [place]   MENISCUS REPAIR Right    NASAL SINUS SURGERY  1976   TONSILLECTOMY     Family History  Problem Relation Age of Onset   Alcohol abuse Brother    Cancer Brother    Diabetes Brother    Hyperlipidemia Brother    Hypertension Brother    Early death Brother    Diabetes Mother    Hypertension Mother    Hyperlipidemia Mother    Heart attack Mother    Anxiety disorder Mother    Heart disease Mother    Hyperlipidemia Father    Hypertension Father    Cancer Father    Cancer Brother    Social History   Socioeconomic History    Marital status: Married    Spouse name: Not on file   Number of children: 2   Years of education: Not on file   Highest education level: Associate degree: occupational, Scientist, product/process development, or vocational program  Occupational History   Not on file  Tobacco Use   Smoking status: Former    Current packs/day: 0.00    Average packs/day: 1.5 packs/day for 26.0 years (39.0 ttl pk-yrs)    Types: Cigarettes    Start date: 78    Quit date: 1994    Years since quitting: 30.8   Smokeless tobacco: Former  Building services engineer status: Never Used  Substance and Sexual Activity   Alcohol use: Yes  Comment: About 12 drinks a year!   Drug use: No   Sexual activity: Yes  Other Topics Concern   Not on file  Social History Narrative   Not on file   Social Determinants of Health   Financial Resource Strain: Patient Declined (08/15/2023)   Overall Financial Resource Strain (CARDIA)    Difficulty of Paying Living Expenses: Patient declined  Food Insecurity: Unknown (08/15/2023)   Hunger Vital Sign    Worried About Running Out of Food in the Last Year: Never true    Ran Out of Food in the Last Year: Patient declined  Transportation Needs: No Transportation Needs (08/15/2023)   PRAPARE - Administrator, Civil Service (Medical): No    Lack of Transportation (Non-Medical): No  Physical Activity: Inactive (08/15/2023)   Exercise Vital Sign    Days of Exercise per Week: 0 days    Minutes of Exercise per Session: 0 min  Stress: No Stress Concern Present (08/15/2023)   [employer] of Occupational Health - Occupational Stress Questionnaire    Feeling of Stress : Not at all  Recent Concern: Stress - Stress Concern Present (08/09/2023)   [employer] of Occupational Health - Occupational Stress Questionnaire    Feeling of Stress : To some extent  Social Connections: Unknown (08/15/2023)   Social Connection and Isolation Panel [NHANES]    Frequency of Communication with Friends and  Family: Patient declined    Frequency of Social Gatherings with Friends and Family: Patient declined    Attends Religious Services: Patient declined    Database administrator or Organizations: Yes    Attends Engineer, structural: Patient declined    Marital Status: Married    Tobacco Counseling Counseling given: Not Answered   Clinical Intake:  Pre-visit preparation completed: Yes  Pain : No/denies pain  Nutritional Risks: None Diabetes: Yes CBG done?: No Did pt. bring in CBG monitor from home?: No  How often do you need to have someone help you when you read instructions, pamphlets, or other written materials from your doctor or pharmacy?: 1 - Never  Interpreter Needed?: No  Information entered by :: [employer], CMA   Activities of Daily Living    08/15/2023    5:00 PM  In your present state of health, do you have any difficulty performing the following activities:  Hearing? 1  Comment wears hearing aids  Vision? 0  Difficulty concentrating or making decisions? 0  Walking or climbing stairs? 1  Dressing or bathing? 0  Doing errands, shopping? 1  Preparing Food and eating ? N  Using the Toilet? N  In the past six months, have you accidently leaked urine? Y  Do you have problems with loss of bowel control? N  Managing your Medications? N  Managing your Finances? N  Housekeeping or managing your Housekeeping? Y    Patient Care Team: Copland, Gwenlyn Found, MD as PCP - General (Family Medicine) Jens Som Madolyn Frieze, MD as Consulting Physician (Cardiology) Ronne Binning Mardene Celeste, MD as Consulting Physician (Urology)  Indicate any recent Medical Services you may have received from other than [facility] providers in the past year (date may be approximate).     Assessment:   This is a routine wellness examination for [place].  Hearing/Vision screen No results found.   Goals Addressed   None    Depression Screen    08/18/2023    3:24 PM 05/19/2023    1:35 PM  12/13/2022   10:29 AM 08/05/2022  9:05 AM 02/17/2022    8:17 AM 06/02/2021   11:14 AM 05/20/2021   10:49 AM  PHQ 2/9 Scores  PHQ - 2 Score 0 1 0 1 0 0 4  PHQ- 9 Score       11    Fall Risk    08/15/2023    5:00 PM 05/19/2023    1:34 PM 12/13/2022   10:29 AM 08/05/2022    9:05 AM 02/17/2022    8:17 AM  Fall Risk   Falls in the past year? 1 1 0 1 0  Comment    tripped by dog   Number falls in past yr: 1 1 0 0 0  Injury with Fall? 0 0 0 0 0  Risk for fall due to : History of fall(s) History of fall(s) No Fall Risks No Fall Risks   Follow up Falls evaluation completed Falls evaluation completed Falls evaluation completed Falls evaluation completed     MEDICARE RISK AT HOME: Medicare Risk at Home Any stairs in or around the home?: Yes If so, are there any without handrails?: No Home free of loose throw rugs in walkways, pet beds, electrical cords, etc?: No Adequate lighting in your home to reduce risk of falls?: Yes Life alert?: No Use of a cane, walker or w/c?: Yes Grab bars in the bathroom?: No Shower chair or bench in shower?: No Elevated toilet seat or a handicapped toilet?: No  TIMED UP AND GO:  Was the test performed?  No    Cognitive Function:        08/18/2023    3:27 PM 08/05/2022    9:15 AM  6CIT Screen  What Year? 0 points 0 points  What month? 0 points 0 points  What time? 0 points 0 points  Count back from 20 0 points 0 points  Months in reverse 2 points 2 points  Repeat phrase 2 points 2 points  Total Score 4 points 4 points    Immunizations Immunization History  Administered Date(s) Administered   Influenza Split 06/11/2020, 09/23/2021, 08/13/2022   Influenza, High Dose Seasonal PF 06/23/2017, 06/25/2018, 06/14/2019   Influenza-Unspecified 07/24/2015, 07/22/2016, 06/23/2017, 06/24/2023   Pneumococcal Conjugate-13 01/10/2017   Pneumococcal Polysaccharide-23 05/22/2018   Tdap 12/09/2016, 02/28/2023   Zoster Recombinant(Shingrix) 04/22/2023,  06/24/2023   Zoster, Live 07/24/2015    TDAP status: Up to date  Flu Vaccine status: Up to date  Pneumococcal vaccine status: Up to date  Covid-19 vaccine status: Information provided on how to obtain vaccines.   Qualifies for Shingles Vaccine? Yes   Zostavax completed Yes   Shingrix Completed?: Yes  Screening Tests Health Maintenance  Topic Date Due   COVID-19 Vaccine (1) Never done   Medicare Annual Wellness (AWV)  08/06/2023   OPHTHALMOLOGY EXAM  08/21/2023   HEMOGLOBIN A1C  09/09/2023   Diabetic kidney evaluation - Urine ACR  05/18/2024   FOOT EXAM  05/18/2024   Diabetic kidney evaluation - eGFR measurement  08/09/2024   Fecal DNA (Cologuard)  07/14/2026   DTaP/Tdap/Td (3 - Td or Tdap) 02/27/2033   Pneumonia Vaccine 9+ Years old  Completed   INFLUENZA VACCINE  Completed   Hepatitis C Screening  Completed   Zoster Vaccines- Shingrix  Completed   HPV VACCINES  Aged Out   Colonoscopy  Discontinued    Health Maintenance  Health Maintenance Due  Topic Date Due   COVID-19 Vaccine (1) Never done   Medicare Annual Wellness (AWV)  08/06/2023    Colorectal cancer  screening: Type of screening: Cologuard. Completed 07/15/23. Repeat every 3 years  Lung Cancer Screening: (Low Dose CT Chest recommended if Age 24-80 years, 20 pack-year currently smoking OR have quit w/in 15years.) does not qualify.   Additional Screening:  Hepatitis C Screening: does qualify; Completed 12/09/16  Vision Screening: Recommended annual ophthalmology exams for early detection of glaucoma and other disorders of the eye. Is the patient up to date with their annual eye exam?  Yes  Who is the provider or what is the name of the office in which the patient attends annual eye exams? [facility] If pt is not established with a provider, would they like to be referred to a provider to establish care? No .   Dental Screening: Recommended annual dental exams for proper oral  hygiene  Diabetic Foot Exam: Diabetic Foot Exam: Completed 05/19/23  Community Resource Referral / Chronic Care Management: CRR required this visit?  No   CCM required this visit?  No     Plan:     I have personally reviewed and noted the following in the patient's chart:   Medical and social history Use of alcohol, tobacco or illicit drugs  Current medications and supplements including opioid prescriptions. Patient is not currently taking opioid prescriptions. Functional ability and status Nutritional status Physical activity Advanced directives List of other physicians Hospitalizations, surgeries, and ER visits in previous 12 months Vitals Screenings to include cognitive, depression, and falls Referrals and appointments  In addition, I have reviewed and discussed with patient certain preventive protocols, quality metrics, and best practice recommendations. A written personalized care plan for preventive services as well as general preventive health recommendations were provided to patient.     Donne Anon, CMA   08/18/2023   After Visit Summary: (MyChart) Due to this being a telephonic visit, the after visit summary with patients personalized plan was offered to patient via MyChart   Nurse Notes: None

## 2023-08-18 NOTE — Patient Instructions (Signed)
David Singh , Thank you for taking time to come for your Medicare Wellness Visit. I appreciate your ongoing commitment to your health goals. Please review the following plan we discussed and let me know if I can assist you in the future.     This is a list of the screening recommended for you and due dates:  Health Maintenance  Topic Date Due   COVID-19 Vaccine (1) Never done   Eye exam for diabetics  08/21/2023   Hemoglobin A1C  09/09/2023   Yearly kidney health urinalysis for diabetes  05/18/2024   Complete foot exam   05/18/2024   Yearly kidney function blood test for diabetes  08/09/2024   Medicare Annual Wellness Visit  08/17/2024   Cologuard (Stool DNA test)  07/14/2026   DTaP/Tdap/Td vaccine (3 - Td or Tdap) 02/27/2033   Pneumonia Vaccine  Completed   Flu Shot  Completed   Hepatitis C Screening  Completed   Zoster (Shingles) Vaccine  Completed   HPV Vaccine  Aged Out   Colon Cancer Screening  Discontinued    Next appointment: Follow up in one year for your annual wellness visit.   Preventive Care 32 Years and Older, Male Preventive care refers to lifestyle choices and visits with your health care provider that can promote health and wellness. What does preventive care include? A yearly physical exam. This is also called an annual well check. Dental exams once or twice a year. Routine eye exams. Ask your health care provider how often you should have your eyes checked. Personal lifestyle choices, including: Daily care of your teeth and gums. Regular physical activity. Eating a healthy diet. Avoiding tobacco and drug use. Limiting alcohol use. Practicing safe sex. Taking low doses of aspirin every day. Taking vitamin and mineral supplements as recommended by your health care provider. What happens during an annual well check? The services and screenings done by your health care provider during your annual well check will depend on your age, overall health, lifestyle  risk factors, and family history of disease. Counseling  Your health care provider may ask you questions about your: Alcohol use. Tobacco use. Drug use. Emotional well-being. Home and relationship well-being. Sexual activity. Eating habits. History of falls. Memory and ability to understand (cognition). Work and work Astronomer. Screening  You may have the following tests or measurements: Height, weight, and BMI. Blood pressure. Lipid and cholesterol levels. These may be checked every 5 years, or more frequently if you are over 3 years old. Skin check. Lung cancer screening. You may have this screening every year starting at age 71 if you have a 30-pack-year history of smoking and currently smoke or have quit within the past 15 years. Fecal occult blood test (FOBT) of the stool. You may have this test every year starting at age 47. Flexible sigmoidoscopy or colonoscopy. You may have a sigmoidoscopy every 5 years or a colonoscopy every 10 years starting at age 4. Prostate cancer screening. Recommendations will vary depending on your family history and other risks. Hepatitis C blood test. Hepatitis B blood test. Sexually transmitted disease (STD) testing. Diabetes screening. This is done by checking your blood sugar (glucose) after you have not eaten for a while (fasting). You may have this done every 1-3 years. Abdominal aortic aneurysm (AAA) screening. You may need this if you are a current or former smoker. Osteoporosis. You may be screened starting at age 14 if you are at high risk. Talk with your health care provider about  your test results, treatment options, and if necessary, the need for more tests. Vaccines  Your health care provider may recommend certain vaccines, such as: Influenza vaccine. This is recommended every year. Tetanus, diphtheria, and acellular pertussis (Tdap, Td) vaccine. You may need a Td booster every 10 years. Zoster vaccine. You may need this after age  1. Pneumococcal 13-valent conjugate (PCV13) vaccine. One dose is recommended after age 60. Pneumococcal polysaccharide (PPSV23) vaccine. One dose is recommended after age 63. Talk to your health care provider about which screenings and vaccines you need and how often you need them. This information is not intended to replace advice given to you by your health care provider. Make sure you discuss any questions you have with your health care provider. Document Released: 11/07/2015 Document Revised: 06/30/2016 Document Reviewed: 08/12/2015 Elsevier Interactive Patient Education  2017 [employer].  Fall Prevention in the Home Falls can cause injuries. They can happen to people of all ages. There are many things you can do to make your home safe and to help prevent falls. What can I do on the outside of my home? Regularly fix the edges of walkways and driveways and fix any cracks. Remove anything that might make you trip as you walk through a door, such as a raised step or threshold. Trim any bushes or trees on the path to your home. Use bright outdoor lighting. Clear any walking paths of anything that might make someone trip, such as rocks or tools. Regularly check to see if handrails are loose or broken. Make sure that both sides of any steps have handrails. Any raised decks and porches should have guardrails on the edges. Have any leaves, snow, or ice cleared regularly. Use sand or salt on walking paths during winter. Clean up any spills in your garage right away. This includes oil or grease spills. What can I do in the bathroom? Use night lights. Install grab bars by the toilet and in the tub and shower. Do not use towel bars as grab bars. Use non-skid mats or decals in the tub or shower. If you need to sit down in the shower, use a plastic, non-slip stool. Keep the floor dry. Clean up any water that spills on the floor as soon as it happens. Remove soap buildup in the tub or shower  regularly. Attach bath mats securely with double-sided non-slip rug tape. Do not have throw rugs and other things on the floor that can make you trip. What can I do in the bedroom? Use night lights. Make sure that you have a light by your bed that is easy to reach. Do not use any sheets or blankets that are too big for your bed. They should not hang down onto the floor. Have a firm chair that has side arms. You can use this for support while you get dressed. Do not have throw rugs and other things on the floor that can make you trip. What can I do in the kitchen? Clean up any spills right away. Avoid walking on wet floors. Keep items that you use a lot in easy-to-reach places. If you need to reach something above you, use a strong step stool that has a grab bar. Keep electrical cords out of the way. Do not use floor polish or wax that makes floors slippery. If you must use wax, use non-skid floor wax. Do not have throw rugs and other things on the floor that can make you trip. What can I do with  my stairs? Do not leave any items on the stairs. Make sure that there are handrails on both sides of the stairs and use them. Fix handrails that are broken or loose. Make sure that handrails are as long as the stairways. Check any carpeting to make sure that it is firmly attached to the stairs. Fix any carpet that is loose or worn. Avoid having throw rugs at the top or bottom of the stairs. If you do have throw rugs, attach them to the floor with carpet tape. Make sure that you have a light switch at the top of the stairs and the bottom of the stairs. If you do not have them, ask someone to add them for you. What else can I do to help prevent falls? Wear shoes that: Do not have high heels. Have rubber bottoms. Are comfortable and fit you well. Are closed at the toe. Do not wear sandals. If you use a stepladder: Make sure that it is fully opened. Do not climb a closed stepladder. Make sure that  both sides of the stepladder are locked into place. Ask someone to hold it for you, if possible. Clearly mark and make sure that you can see: Any grab bars or handrails. First and last steps. Where the edge of each step is. Use tools that help you move around (mobility aids) if they are needed. These include: Canes. Walkers. Scooters. Crutches. Turn on the lights when you go into a dark area. Replace any light bulbs as soon as they burn out. Set up your furniture so you have a clear path. Avoid moving your furniture around. If any of your floors are uneven, fix them. If there are any pets around you, be aware of where they are. Review your medicines with your doctor. Some medicines can make you feel dizzy. This can increase your chance of falling. Ask your doctor what other things that you can do to help prevent falls. This information is not intended to replace advice given to you by your health care provider. Make sure you discuss any questions you have with your health care provider. Document Released: 08/07/2009 Document Revised: 03/18/2016 Document Reviewed: 11/15/2014 Elsevier Interactive Patient Education  2017 [employer].

## 2023-08-19 DIAGNOSIS — Z862 Personal history of diseases of the blood and blood-forming organs and certain disorders involving the immune mechanism: Secondary | ICD-10-CM | POA: Diagnosis not present

## 2023-08-19 DIAGNOSIS — D649 Anemia, unspecified: Secondary | ICD-10-CM | POA: Diagnosis not present

## 2023-08-19 DIAGNOSIS — D513 Other dietary vitamin B12 deficiency anemia: Secondary | ICD-10-CM | POA: Diagnosis not present

## 2023-08-23 DIAGNOSIS — M1711 Unilateral primary osteoarthritis, right knee: Secondary | ICD-10-CM | POA: Diagnosis not present

## 2023-08-24 DIAGNOSIS — Z01818 Encounter for other preprocedural examination: Secondary | ICD-10-CM | POA: Diagnosis not present

## 2023-08-24 DIAGNOSIS — M1711 Unilateral primary osteoarthritis, right knee: Secondary | ICD-10-CM | POA: Diagnosis not present

## 2023-08-26 DIAGNOSIS — H25013 Cortical age-related cataract, bilateral: Secondary | ICD-10-CM | POA: Diagnosis not present

## 2023-08-26 DIAGNOSIS — H5203 Hypermetropia, bilateral: Secondary | ICD-10-CM | POA: Diagnosis not present

## 2023-08-26 DIAGNOSIS — H524 Presbyopia: Secondary | ICD-10-CM | POA: Diagnosis not present

## 2023-08-26 DIAGNOSIS — H02834 Dermatochalasis of left upper eyelid: Secondary | ICD-10-CM | POA: Diagnosis not present

## 2023-08-26 DIAGNOSIS — H52203 Unspecified astigmatism, bilateral: Secondary | ICD-10-CM | POA: Diagnosis not present

## 2023-08-26 DIAGNOSIS — E119 Type 2 diabetes mellitus without complications: Secondary | ICD-10-CM | POA: Diagnosis not present

## 2023-08-26 DIAGNOSIS — H02831 Dermatochalasis of right upper eyelid: Secondary | ICD-10-CM | POA: Diagnosis not present

## 2023-08-26 DIAGNOSIS — H57813 Brow ptosis, bilateral: Secondary | ICD-10-CM | POA: Diagnosis not present

## 2023-08-26 DIAGNOSIS — H2513 Age-related nuclear cataract, bilateral: Secondary | ICD-10-CM | POA: Diagnosis not present

## 2023-08-26 LAB — HM DIABETES EYE EXAM

## 2023-08-30 DIAGNOSIS — Z01810 Encounter for preprocedural cardiovascular examination: Secondary | ICD-10-CM | POA: Diagnosis not present

## 2023-08-30 DIAGNOSIS — I2510 Atherosclerotic heart disease of native coronary artery without angina pectoris: Secondary | ICD-10-CM | POA: Diagnosis not present

## 2023-08-30 DIAGNOSIS — E785 Hyperlipidemia, unspecified: Secondary | ICD-10-CM | POA: Diagnosis not present

## 2023-08-30 DIAGNOSIS — I10 Essential (primary) hypertension: Secondary | ICD-10-CM | POA: Diagnosis not present

## 2023-09-05 ENCOUNTER — Other Ambulatory Visit: Payer: Self-pay | Admitting: Cardiology

## 2023-09-05 DIAGNOSIS — G8929 Other chronic pain: Secondary | ICD-10-CM | POA: Diagnosis not present

## 2023-09-05 DIAGNOSIS — M25561 Pain in right knee: Secondary | ICD-10-CM | POA: Diagnosis not present

## 2023-09-05 DIAGNOSIS — M1711 Unilateral primary osteoarthritis, right knee: Secondary | ICD-10-CM | POA: Diagnosis not present

## 2023-09-05 DIAGNOSIS — M25861 Other specified joint disorders, right knee: Secondary | ICD-10-CM | POA: Diagnosis not present

## 2023-09-07 DIAGNOSIS — Z7902 Long term (current) use of antithrombotics/antiplatelets: Secondary | ICD-10-CM | POA: Diagnosis not present

## 2023-09-07 DIAGNOSIS — Z87891 Personal history of nicotine dependence: Secondary | ICD-10-CM | POA: Diagnosis not present

## 2023-09-07 DIAGNOSIS — I10 Essential (primary) hypertension: Secondary | ICD-10-CM | POA: Diagnosis not present

## 2023-09-07 DIAGNOSIS — Z79899 Other long term (current) drug therapy: Secondary | ICD-10-CM | POA: Diagnosis not present

## 2023-09-07 DIAGNOSIS — M25761 Osteophyte, right knee: Secondary | ICD-10-CM | POA: Diagnosis not present

## 2023-09-07 DIAGNOSIS — I2510 Atherosclerotic heart disease of native coronary artery without angina pectoris: Secondary | ICD-10-CM | POA: Diagnosis not present

## 2023-09-07 DIAGNOSIS — Z7982 Long term (current) use of aspirin: Secondary | ICD-10-CM | POA: Diagnosis not present

## 2023-09-07 DIAGNOSIS — Z7984 Long term (current) use of oral hypoglycemic drugs: Secondary | ICD-10-CM | POA: Diagnosis not present

## 2023-09-07 DIAGNOSIS — Z01810 Encounter for preprocedural cardiovascular examination: Secondary | ICD-10-CM | POA: Diagnosis not present

## 2023-09-07 DIAGNOSIS — M1711 Unilateral primary osteoarthritis, right knee: Secondary | ICD-10-CM | POA: Diagnosis not present

## 2023-09-07 DIAGNOSIS — E1151 Type 2 diabetes mellitus with diabetic peripheral angiopathy without gangrene: Secondary | ICD-10-CM | POA: Diagnosis not present

## 2023-09-07 DIAGNOSIS — Z955 Presence of coronary angioplasty implant and graft: Secondary | ICD-10-CM | POA: Diagnosis not present

## 2023-09-14 ENCOUNTER — Ambulatory Visit: Payer: Medicare Other | Admitting: Family Medicine

## 2023-09-16 DIAGNOSIS — M25561 Pain in right knee: Secondary | ICD-10-CM | POA: Diagnosis not present

## 2023-09-16 DIAGNOSIS — M1711 Unilateral primary osteoarthritis, right knee: Secondary | ICD-10-CM | POA: Diagnosis not present

## 2023-09-16 DIAGNOSIS — G8929 Other chronic pain: Secondary | ICD-10-CM | POA: Diagnosis not present

## 2023-09-16 DIAGNOSIS — M25861 Other specified joint disorders, right knee: Secondary | ICD-10-CM | POA: Diagnosis not present

## 2023-09-21 DIAGNOSIS — M25861 Other specified joint disorders, right knee: Secondary | ICD-10-CM | POA: Diagnosis not present

## 2023-09-21 DIAGNOSIS — G8929 Other chronic pain: Secondary | ICD-10-CM | POA: Diagnosis not present

## 2023-09-21 DIAGNOSIS — M25561 Pain in right knee: Secondary | ICD-10-CM | POA: Diagnosis not present

## 2023-09-21 DIAGNOSIS — M1711 Unilateral primary osteoarthritis, right knee: Secondary | ICD-10-CM | POA: Diagnosis not present

## 2023-09-21 DIAGNOSIS — Z96651 Presence of right artificial knee joint: Secondary | ICD-10-CM | POA: Diagnosis not present

## 2023-09-21 DIAGNOSIS — Z471 Aftercare following joint replacement surgery: Secondary | ICD-10-CM | POA: Diagnosis not present

## 2023-09-26 ENCOUNTER — Other Ambulatory Visit: Payer: Self-pay | Admitting: Cardiology

## 2023-09-26 ENCOUNTER — Other Ambulatory Visit: Payer: Self-pay | Admitting: Family Medicine

## 2023-09-26 DIAGNOSIS — F411 Generalized anxiety disorder: Secondary | ICD-10-CM

## 2023-09-28 DIAGNOSIS — M25861 Other specified joint disorders, right knee: Secondary | ICD-10-CM | POA: Diagnosis not present

## 2023-09-28 DIAGNOSIS — Z96651 Presence of right artificial knee joint: Secondary | ICD-10-CM | POA: Diagnosis not present

## 2023-09-28 DIAGNOSIS — G8929 Other chronic pain: Secondary | ICD-10-CM | POA: Diagnosis not present

## 2023-09-28 DIAGNOSIS — M1711 Unilateral primary osteoarthritis, right knee: Secondary | ICD-10-CM | POA: Diagnosis not present

## 2023-09-28 DIAGNOSIS — M25561 Pain in right knee: Secondary | ICD-10-CM | POA: Diagnosis not present

## 2023-09-28 DIAGNOSIS — Z471 Aftercare following joint replacement surgery: Secondary | ICD-10-CM | POA: Diagnosis not present

## 2023-09-28 DIAGNOSIS — M25661 Stiffness of right knee, not elsewhere classified: Secondary | ICD-10-CM | POA: Diagnosis not present

## 2023-09-30 DIAGNOSIS — M25861 Other specified joint disorders, right knee: Secondary | ICD-10-CM | POA: Diagnosis not present

## 2023-09-30 DIAGNOSIS — M25561 Pain in right knee: Secondary | ICD-10-CM | POA: Diagnosis not present

## 2023-09-30 DIAGNOSIS — Z471 Aftercare following joint replacement surgery: Secondary | ICD-10-CM | POA: Diagnosis not present

## 2023-09-30 DIAGNOSIS — Z96651 Presence of right artificial knee joint: Secondary | ICD-10-CM | POA: Diagnosis not present

## 2023-09-30 DIAGNOSIS — G8929 Other chronic pain: Secondary | ICD-10-CM | POA: Diagnosis not present

## 2023-09-30 DIAGNOSIS — M1711 Unilateral primary osteoarthritis, right knee: Secondary | ICD-10-CM | POA: Diagnosis not present

## 2023-09-30 DIAGNOSIS — M25661 Stiffness of right knee, not elsewhere classified: Secondary | ICD-10-CM | POA: Diagnosis not present

## 2023-10-02 ENCOUNTER — Encounter: Payer: Self-pay | Admitting: Family Medicine

## 2023-10-03 MED ORDER — SPIRONOLACTONE 25 MG PO TABS: 25.0000 mg | ORAL_TABLET | Freq: Every day | ORAL | 3 refills | Status: DC

## 2023-10-05 DIAGNOSIS — M25561 Pain in right knee: Secondary | ICD-10-CM | POA: Diagnosis not present

## 2023-10-05 DIAGNOSIS — Z471 Aftercare following joint replacement surgery: Secondary | ICD-10-CM | POA: Diagnosis not present

## 2023-10-05 DIAGNOSIS — M1711 Unilateral primary osteoarthritis, right knee: Secondary | ICD-10-CM | POA: Diagnosis not present

## 2023-10-05 DIAGNOSIS — M25661 Stiffness of right knee, not elsewhere classified: Secondary | ICD-10-CM | POA: Diagnosis not present

## 2023-10-05 DIAGNOSIS — G8929 Other chronic pain: Secondary | ICD-10-CM | POA: Diagnosis not present

## 2023-10-05 DIAGNOSIS — M25861 Other specified joint disorders, right knee: Secondary | ICD-10-CM | POA: Diagnosis not present

## 2023-10-05 DIAGNOSIS — Z96651 Presence of right artificial knee joint: Secondary | ICD-10-CM | POA: Diagnosis not present

## 2023-10-07 DIAGNOSIS — M25561 Pain in right knee: Secondary | ICD-10-CM | POA: Diagnosis not present

## 2023-10-07 DIAGNOSIS — Z96651 Presence of right artificial knee joint: Secondary | ICD-10-CM | POA: Diagnosis not present

## 2023-10-07 DIAGNOSIS — M1711 Unilateral primary osteoarthritis, right knee: Secondary | ICD-10-CM | POA: Diagnosis not present

## 2023-10-07 DIAGNOSIS — Z471 Aftercare following joint replacement surgery: Secondary | ICD-10-CM | POA: Diagnosis not present

## 2023-10-07 DIAGNOSIS — M25861 Other specified joint disorders, right knee: Secondary | ICD-10-CM | POA: Diagnosis not present

## 2023-10-07 DIAGNOSIS — M25661 Stiffness of right knee, not elsewhere classified: Secondary | ICD-10-CM | POA: Diagnosis not present

## 2023-10-07 DIAGNOSIS — G8929 Other chronic pain: Secondary | ICD-10-CM | POA: Diagnosis not present

## 2023-10-12 DIAGNOSIS — Z96651 Presence of right artificial knee joint: Secondary | ICD-10-CM | POA: Diagnosis not present

## 2023-10-12 DIAGNOSIS — M25661 Stiffness of right knee, not elsewhere classified: Secondary | ICD-10-CM | POA: Diagnosis not present

## 2023-10-12 DIAGNOSIS — M1711 Unilateral primary osteoarthritis, right knee: Secondary | ICD-10-CM | POA: Diagnosis not present

## 2023-10-12 DIAGNOSIS — M25861 Other specified joint disorders, right knee: Secondary | ICD-10-CM | POA: Diagnosis not present

## 2023-10-12 DIAGNOSIS — M25561 Pain in right knee: Secondary | ICD-10-CM | POA: Diagnosis not present

## 2023-10-12 DIAGNOSIS — Z471 Aftercare following joint replacement surgery: Secondary | ICD-10-CM | POA: Diagnosis not present

## 2023-10-12 DIAGNOSIS — G8929 Other chronic pain: Secondary | ICD-10-CM | POA: Diagnosis not present

## 2023-10-14 DIAGNOSIS — M25861 Other specified joint disorders, right knee: Secondary | ICD-10-CM | POA: Diagnosis not present

## 2023-10-14 DIAGNOSIS — G8929 Other chronic pain: Secondary | ICD-10-CM | POA: Diagnosis not present

## 2023-10-14 DIAGNOSIS — M25661 Stiffness of right knee, not elsewhere classified: Secondary | ICD-10-CM | POA: Diagnosis not present

## 2023-10-14 DIAGNOSIS — M1711 Unilateral primary osteoarthritis, right knee: Secondary | ICD-10-CM | POA: Diagnosis not present

## 2023-10-14 DIAGNOSIS — Z471 Aftercare following joint replacement surgery: Secondary | ICD-10-CM | POA: Diagnosis not present

## 2023-10-14 DIAGNOSIS — M25561 Pain in right knee: Secondary | ICD-10-CM | POA: Diagnosis not present

## 2023-10-14 DIAGNOSIS — Z96651 Presence of right artificial knee joint: Secondary | ICD-10-CM | POA: Diagnosis not present

## 2023-10-21 DIAGNOSIS — M25861 Other specified joint disorders, right knee: Secondary | ICD-10-CM | POA: Diagnosis not present

## 2023-10-21 DIAGNOSIS — M25561 Pain in right knee: Secondary | ICD-10-CM | POA: Diagnosis not present

## 2023-10-21 DIAGNOSIS — M1711 Unilateral primary osteoarthritis, right knee: Secondary | ICD-10-CM | POA: Diagnosis not present

## 2023-10-21 DIAGNOSIS — M25661 Stiffness of right knee, not elsewhere classified: Secondary | ICD-10-CM | POA: Diagnosis not present

## 2023-10-21 DIAGNOSIS — Z471 Aftercare following joint replacement surgery: Secondary | ICD-10-CM | POA: Diagnosis not present

## 2023-10-21 DIAGNOSIS — G8929 Other chronic pain: Secondary | ICD-10-CM | POA: Diagnosis not present

## 2023-10-21 DIAGNOSIS — Z96651 Presence of right artificial knee joint: Secondary | ICD-10-CM | POA: Diagnosis not present

## 2023-10-27 DIAGNOSIS — Z96651 Presence of right artificial knee joint: Secondary | ICD-10-CM | POA: Diagnosis not present

## 2023-10-27 DIAGNOSIS — M1711 Unilateral primary osteoarthritis, right knee: Secondary | ICD-10-CM | POA: Diagnosis not present

## 2023-10-27 DIAGNOSIS — G8929 Other chronic pain: Secondary | ICD-10-CM | POA: Diagnosis not present

## 2023-10-27 DIAGNOSIS — Z471 Aftercare following joint replacement surgery: Secondary | ICD-10-CM | POA: Diagnosis not present

## 2023-10-27 DIAGNOSIS — M25561 Pain in right knee: Secondary | ICD-10-CM | POA: Diagnosis not present

## 2023-10-27 DIAGNOSIS — M25661 Stiffness of right knee, not elsewhere classified: Secondary | ICD-10-CM | POA: Diagnosis not present

## 2023-10-27 DIAGNOSIS — M25861 Other specified joint disorders, right knee: Secondary | ICD-10-CM | POA: Diagnosis not present

## 2023-11-03 DIAGNOSIS — G8929 Other chronic pain: Secondary | ICD-10-CM | POA: Diagnosis not present

## 2023-11-03 DIAGNOSIS — M1711 Unilateral primary osteoarthritis, right knee: Secondary | ICD-10-CM | POA: Diagnosis not present

## 2023-11-03 DIAGNOSIS — M25861 Other specified joint disorders, right knee: Secondary | ICD-10-CM | POA: Diagnosis not present

## 2023-11-03 DIAGNOSIS — M25661 Stiffness of right knee, not elsewhere classified: Secondary | ICD-10-CM | POA: Diagnosis not present

## 2023-11-03 DIAGNOSIS — Z471 Aftercare following joint replacement surgery: Secondary | ICD-10-CM | POA: Diagnosis not present

## 2023-11-03 DIAGNOSIS — Z96651 Presence of right artificial knee joint: Secondary | ICD-10-CM | POA: Diagnosis not present

## 2023-11-03 DIAGNOSIS — M25561 Pain in right knee: Secondary | ICD-10-CM | POA: Diagnosis not present

## 2023-11-10 DIAGNOSIS — M1711 Unilateral primary osteoarthritis, right knee: Secondary | ICD-10-CM | POA: Diagnosis not present

## 2023-11-10 DIAGNOSIS — G8929 Other chronic pain: Secondary | ICD-10-CM | POA: Diagnosis not present

## 2023-11-10 DIAGNOSIS — Z96651 Presence of right artificial knee joint: Secondary | ICD-10-CM | POA: Diagnosis not present

## 2023-11-10 DIAGNOSIS — Z471 Aftercare following joint replacement surgery: Secondary | ICD-10-CM | POA: Diagnosis not present

## 2023-11-10 DIAGNOSIS — M25661 Stiffness of right knee, not elsewhere classified: Secondary | ICD-10-CM | POA: Diagnosis not present

## 2023-11-10 DIAGNOSIS — M25861 Other specified joint disorders, right knee: Secondary | ICD-10-CM | POA: Diagnosis not present

## 2023-11-10 DIAGNOSIS — M25561 Pain in right knee: Secondary | ICD-10-CM | POA: Diagnosis not present

## 2023-11-17 DIAGNOSIS — Z471 Aftercare following joint replacement surgery: Secondary | ICD-10-CM | POA: Diagnosis not present

## 2023-11-17 DIAGNOSIS — M25861 Other specified joint disorders, right knee: Secondary | ICD-10-CM | POA: Diagnosis not present

## 2023-11-17 DIAGNOSIS — Z96651 Presence of right artificial knee joint: Secondary | ICD-10-CM | POA: Diagnosis not present

## 2023-11-17 DIAGNOSIS — M25661 Stiffness of right knee, not elsewhere classified: Secondary | ICD-10-CM | POA: Diagnosis not present

## 2023-11-17 DIAGNOSIS — G8929 Other chronic pain: Secondary | ICD-10-CM | POA: Diagnosis not present

## 2023-11-17 DIAGNOSIS — M1711 Unilateral primary osteoarthritis, right knee: Secondary | ICD-10-CM | POA: Diagnosis not present

## 2023-11-17 DIAGNOSIS — M25561 Pain in right knee: Secondary | ICD-10-CM | POA: Diagnosis not present

## 2023-11-24 DIAGNOSIS — M25561 Pain in right knee: Secondary | ICD-10-CM | POA: Diagnosis not present

## 2023-11-24 DIAGNOSIS — M1711 Unilateral primary osteoarthritis, right knee: Secondary | ICD-10-CM | POA: Diagnosis not present

## 2023-11-24 DIAGNOSIS — M25861 Other specified joint disorders, right knee: Secondary | ICD-10-CM | POA: Diagnosis not present

## 2023-11-24 DIAGNOSIS — Z96651 Presence of right artificial knee joint: Secondary | ICD-10-CM | POA: Diagnosis not present

## 2023-11-24 DIAGNOSIS — G8929 Other chronic pain: Secondary | ICD-10-CM | POA: Diagnosis not present

## 2023-11-24 DIAGNOSIS — M25661 Stiffness of right knee, not elsewhere classified: Secondary | ICD-10-CM | POA: Diagnosis not present

## 2023-11-24 DIAGNOSIS — Z471 Aftercare following joint replacement surgery: Secondary | ICD-10-CM | POA: Diagnosis not present

## 2023-11-27 ENCOUNTER — Other Ambulatory Visit: Payer: Self-pay | Admitting: Family Medicine

## 2023-11-27 DIAGNOSIS — M109 Gout, unspecified: Secondary | ICD-10-CM

## 2023-12-01 DIAGNOSIS — Z96651 Presence of right artificial knee joint: Secondary | ICD-10-CM | POA: Diagnosis not present

## 2023-12-01 DIAGNOSIS — M25661 Stiffness of right knee, not elsewhere classified: Secondary | ICD-10-CM | POA: Diagnosis not present

## 2023-12-01 DIAGNOSIS — M25561 Pain in right knee: Secondary | ICD-10-CM | POA: Diagnosis not present

## 2023-12-01 DIAGNOSIS — Z471 Aftercare following joint replacement surgery: Secondary | ICD-10-CM | POA: Diagnosis not present

## 2023-12-01 DIAGNOSIS — M25861 Other specified joint disorders, right knee: Secondary | ICD-10-CM | POA: Diagnosis not present

## 2023-12-01 DIAGNOSIS — G8929 Other chronic pain: Secondary | ICD-10-CM | POA: Diagnosis not present

## 2023-12-01 DIAGNOSIS — M1711 Unilateral primary osteoarthritis, right knee: Secondary | ICD-10-CM | POA: Diagnosis not present

## 2023-12-08 DIAGNOSIS — M25561 Pain in right knee: Secondary | ICD-10-CM | POA: Diagnosis not present

## 2023-12-08 DIAGNOSIS — Z96651 Presence of right artificial knee joint: Secondary | ICD-10-CM | POA: Diagnosis not present

## 2023-12-08 DIAGNOSIS — Z471 Aftercare following joint replacement surgery: Secondary | ICD-10-CM | POA: Diagnosis not present

## 2023-12-08 DIAGNOSIS — G8929 Other chronic pain: Secondary | ICD-10-CM | POA: Diagnosis not present

## 2023-12-08 DIAGNOSIS — M25861 Other specified joint disorders, right knee: Secondary | ICD-10-CM | POA: Diagnosis not present

## 2023-12-08 DIAGNOSIS — M25661 Stiffness of right knee, not elsewhere classified: Secondary | ICD-10-CM | POA: Diagnosis not present

## 2023-12-08 DIAGNOSIS — M1711 Unilateral primary osteoarthritis, right knee: Secondary | ICD-10-CM | POA: Diagnosis not present

## 2024-01-05 ENCOUNTER — Other Ambulatory Visit: Payer: Self-pay | Admitting: Family Medicine

## 2024-01-05 DIAGNOSIS — M109 Gout, unspecified: Secondary | ICD-10-CM

## 2024-01-09 ENCOUNTER — Encounter: Payer: Self-pay | Admitting: Family Medicine

## 2024-01-17 ENCOUNTER — Other Ambulatory Visit: Payer: Self-pay | Admitting: Family Medicine

## 2024-01-18 DIAGNOSIS — Z87891 Personal history of nicotine dependence: Secondary | ICD-10-CM | POA: Diagnosis not present

## 2024-01-18 DIAGNOSIS — H9191 Unspecified hearing loss, right ear: Secondary | ICD-10-CM | POA: Diagnosis not present

## 2024-01-18 DIAGNOSIS — Z01818 Encounter for other preprocedural examination: Secondary | ICD-10-CM | POA: Diagnosis not present

## 2024-01-22 ENCOUNTER — Other Ambulatory Visit: Payer: Self-pay | Admitting: Family Medicine

## 2024-01-26 DIAGNOSIS — H748X1 Other specified disorders of right middle ear and mastoid: Secondary | ICD-10-CM | POA: Diagnosis not present

## 2024-01-26 DIAGNOSIS — Z87891 Personal history of nicotine dependence: Secondary | ICD-10-CM | POA: Diagnosis not present

## 2024-01-26 DIAGNOSIS — H903 Sensorineural hearing loss, bilateral: Secondary | ICD-10-CM | POA: Diagnosis not present

## 2024-01-26 DIAGNOSIS — I10 Essential (primary) hypertension: Secondary | ICD-10-CM | POA: Diagnosis not present

## 2024-01-26 DIAGNOSIS — I2510 Atherosclerotic heart disease of native coronary artery without angina pectoris: Secondary | ICD-10-CM | POA: Diagnosis not present

## 2024-01-26 DIAGNOSIS — M179 Osteoarthritis of knee, unspecified: Secondary | ICD-10-CM | POA: Diagnosis not present

## 2024-01-26 DIAGNOSIS — H9041 Sensorineural hearing loss, unilateral, right ear, with unrestricted hearing on the contralateral side: Secondary | ICD-10-CM | POA: Diagnosis not present

## 2024-01-26 DIAGNOSIS — E1151 Type 2 diabetes mellitus with diabetic peripheral angiopathy without gangrene: Secondary | ICD-10-CM | POA: Diagnosis not present

## 2024-01-26 DIAGNOSIS — Z7984 Long term (current) use of oral hypoglycemic drugs: Secondary | ICD-10-CM | POA: Diagnosis not present

## 2024-02-02 ENCOUNTER — Encounter: Payer: Self-pay | Admitting: Family Medicine

## 2024-02-02 ENCOUNTER — Other Ambulatory Visit: Payer: Self-pay | Admitting: Family Medicine

## 2024-02-02 DIAGNOSIS — M109 Gout, unspecified: Secondary | ICD-10-CM

## 2024-02-02 DIAGNOSIS — F411 Generalized anxiety disorder: Secondary | ICD-10-CM

## 2024-02-02 DIAGNOSIS — I10 Essential (primary) hypertension: Secondary | ICD-10-CM

## 2024-02-03 MED ORDER — ROSUVASTATIN CALCIUM 40 MG PO TABS: 40.0000 mg | ORAL_TABLET | Freq: Every day | ORAL | 0 refills | Status: DC

## 2024-02-10 NOTE — Progress Notes (Addendum)
 [facility] [address] [place], [place] [address] [phone]  Date:  02/13/2024   Name:  David Singh   DOB:  1951/02/01   MRN:  [id number]  PCP:  Kaylee Partridge, MD    Chief Complaint: No chief complaint on file.   History of Present Illness:  David Singh is a 73 y.o. very pleasant male patient who presents with the following:  Patient seen today for periodic follow-up.  Most recent visit with myself was in October History of diabetes, CAD with stent, hyperlipidemia, hypertension, significant peripheral arterial disease He notes his PAD is stable.  He can walk about the same distance before he gets pain   At our visit in the fall he was planned to have a knee replacement and was also looking at getting a possible cochlear implant  We also found significant iron deficiency in the fall-I placed a referral to hematology for assessment especially prior to his knee replacement He had a Lexiscan  in November which was reassuring He underwent right knee replacement September 07, 2023  Eye exam Can update A1c He has declined all COVID vaccination He has received his shingles and pneumonia vaccines, flu shot up-to-date Recommend dose of RSV  Lab Results  Component Value Date   HGBA1C 7.1 (H) 03/09/2023     Patient Active Problem List   Diagnosis Date Noted   Primary osteoarthritis of right knee 03/03/2020   Chronic gout 03/09/2016   Hypertriglyceridemia 01/16/2015   Type 2 diabetes mellitus (HCC) 01/06/2015   CAD in native artery 01/06/2015   History of coronary artery stent placement 01/06/2015   PAD (peripheral artery disease) (HCC) 01/06/2015   Hyperlipidemia 01/06/2015   Essential hypertension 01/06/2015    Past Medical History:  Diagnosis Date   Anxiety 1975   Arthritis 2019   CAD (coronary artery disease)    Cancer (HCC)    basal cell excision upper right ear 7 yrs ago    Cataract 2020   Not bad enough to remove!   Complication of anesthesia    bad pain with anesthesia induction with colonscopy 6 yrs ago   Diabetes mellitus without complication (HCC)    type 2   GERD (gastroesophageal reflux disease)    Hyperlipidemia    Hypertension    Peripheral vascular disease (HCC)    partial clogged artery right leg   Sleep apnea    yrs ago sleep study no cpap used    Past Surgical History:  Procedure Laterality Date   ABDOMINAL AORTOGRAM W/LOWER EXTREMITY N/A 12/27/2016   Procedure: Abdominal Aortogram w/Lower Extremity;  Surgeon: Dannis Dy, MD;  Location: [facility] INVASIVE CV LAB;  Service: Cardiovascular;  Laterality: N/A;   CARDIAC SURGERY     CORONARY ANGIOPLASTY WITH STENT PLACEMENT  11/2004   in [place]    MENISCUS REPAIR Right    NASAL SINUS SURGERY  1976   TONSILLECTOMY      Social History   Tobacco Use   Smoking status: Former    Current packs/day: 0.00    Average packs/day: 1.5 packs/day for 26.0 years (39.0 ttl pk-yrs)    Types: Cigarettes    Start date: 74    Quit date: 1994    Years since quitting: 31.3   Smokeless tobacco: Former  Building services engineer status: Never Used  Substance Use Topics   Alcohol use: Yes    Comment: About 12 drinks a year!  Drug use: No    Family History  Problem Relation Age of Onset   Alcohol abuse Brother    Cancer Brother    Diabetes Brother    Hyperlipidemia Brother    Hypertension Brother    Early death Brother    Diabetes Mother    Hypertension Mother    Hyperlipidemia Mother    Heart attack Mother    Anxiety disorder Mother    Heart disease Mother    Hyperlipidemia Father    Hypertension Father    Cancer Father    Cancer Brother     Allergies  Allergen Reactions   Dexilant [Dexlansoprazole]     Pressure in stomach and chest    Medication list has been reviewed and updated.  Current Outpatient Medications on File Prior to Visit  Medication Sig Dispense Refill    allopurinol  (ZYLOPRIM ) 100 MG tablet TAKE 1 TABLET (100 MG TOTAL) BY MOUTH DAILY. INCREASE AS DIRECTED BY MD 90 tablet 0   amLODipine  (NORVASC ) 10 MG tablet TAKE 1 TABLET BY MOUTH DAILY 100 tablet 2   aspirin 81 MG tablet Take 81 mg by mouth daily.     busPIRone  (BUSPAR ) 15 MG tablet Take 1 tablet (15 mg total) by mouth 2 (two) times daily. 180 tablet 0   carvedilol  (COREG ) 25 MG tablet Take 1 tablet (25 mg total) by mouth 2 (two) times daily. 180 tablet 0   Cholecalciferol (VITAMIN D3) 2000 units TABS Take 2,000 Units by mouth daily.     cloNIDine  (CATAPRES ) 0.2 MG tablet Take 1 tablet (0.2 mg total) by mouth 2 (two) times daily. 180 tablet 0   Coenzyme Q10 300 MG CAPS Take 300 mg by mouth daily.      Cyanocobalamin (VITAMIN B-12 PO) Take 2,500 mg by mouth daily.     glucose blood (FREESTYLE LITE) test strip Check blood sugar 1-3x daily 100 each 12   lisinopril  (ZESTRIL ) 10 MG tablet Take 1-2 tablets (10-20 mg total) by mouth daily. 180 tablet 0   meloxicam  (MOBIC ) 15 MG tablet Take 1 tablet (15 mg total) by mouth daily as needed for pain. 90 tablet 1   metFORMIN  (GLUCOPHAGE ) 1000 MG tablet TAKE 1 TABLET BY MOUTH TWICE  DAILY WITH A MEAL 200 tablet 2   MULTIPLE VITAMIN PO Take 1 tablet by mouth daily.      niacin  500 MG tablet Take 500 mg by mouth every morning.      pantoprazole  (PROTONIX ) 40 MG tablet Take 1 tablet (40 mg total) by mouth daily. 90 tablet 0   rosuvastatin  (CRESTOR ) 40 MG tablet Take 1 tablet (40 mg total) by mouth daily. 90 tablet 0   spironolactone  (ALDACTONE ) 25 MG tablet Take 1 tablet (25 mg total) by mouth daily. 90 tablet 3   No current facility-administered medications on file prior to visit.    Review of Systems:  ***  Physical Examination: There were no vitals filed for this visit. There were no vitals filed for this visit. There is no height or weight on file to calculate BMI. Ideal Body Weight:    ***  Assessment and Plan: ***  Signed Gates Kasal, MD

## 2024-02-10 NOTE — Patient Instructions (Addendum)
 It was good to see you today  I will be in touch with your labs asap  Recommend getting a one-time dose of RSV vaccine at your pharmacy- to prevent a respiratory virus   Assuming all is well please see me in about 6 months

## 2024-02-13 ENCOUNTER — Encounter: Payer: Self-pay | Admitting: Family Medicine

## 2024-02-13 ENCOUNTER — Ambulatory Visit (INDEPENDENT_AMBULATORY_CARE_PROVIDER_SITE_OTHER): Admitting: Family Medicine

## 2024-02-13 VITALS — BP 122/76 | HR 69 | Temp 97.8°F | Resp 16 | Ht 69.0 in | Wt 254.0 lb

## 2024-02-13 DIAGNOSIS — E1159 Type 2 diabetes mellitus with other circulatory complications: Secondary | ICD-10-CM

## 2024-02-13 DIAGNOSIS — Z862 Personal history of diseases of the blood and blood-forming organs and certain disorders involving the immune mechanism: Secondary | ICD-10-CM

## 2024-02-13 DIAGNOSIS — I10 Essential (primary) hypertension: Secondary | ICD-10-CM | POA: Diagnosis not present

## 2024-02-13 DIAGNOSIS — E785 Hyperlipidemia, unspecified: Secondary | ICD-10-CM | POA: Diagnosis not present

## 2024-02-13 LAB — BASIC METABOLIC PANEL WITH GFR
BUN: 19 mg/dL (ref 6–23)
CO2: 23 meq/L (ref 19–32)
Calcium: 9.6 mg/dL (ref 8.4–10.5)
Chloride: 101 meq/L (ref 96–112)
Creatinine, Ser: 1.16 mg/dL (ref 0.40–1.50)
GFR: 63.08 mL/min (ref >60.00)
Glucose, Bld: 123 mg/dL — ABNORMAL HIGH (ref 70–99)
Potassium: 4.5 meq/L (ref 3.5–5.1)
Sodium: 134 meq/L — ABNORMAL LOW (ref 135–145)

## 2024-02-13 LAB — CBC
HCT: 35.4 % — ABNORMAL LOW (ref 39.0–52.0)
Hemoglobin: 11.7 g/dL — ABNORMAL LOW (ref 13.0–17.0)
MCHC: 33.1 g/dL (ref 30.0–36.0)
MCV: 81.3 fl (ref 78.0–100.0)
Platelets: 164 10*3/uL (ref 150.0–400.0)
RBC: 4.36 Mil/uL (ref 4.22–5.81)
RDW: 15.8 % — ABNORMAL HIGH (ref 11.5–15.5)
WBC: 3.8 10*3/uL — ABNORMAL LOW (ref 4.0–10.5)

## 2024-02-13 LAB — HEMOGLOBIN A1C: Hgb A1c MFr Bld: 7.3 % — ABNORMAL HIGH (ref 4.6–6.5)

## 2024-02-13 LAB — FERRITIN: Ferritin: 12.5 ng/mL — ABNORMAL LOW (ref 22.0–322.0)

## 2024-02-14 ENCOUNTER — Encounter: Payer: Self-pay | Admitting: Family Medicine

## 2024-02-18 ENCOUNTER — Encounter: Payer: Self-pay | Admitting: Family Medicine

## 2024-02-18 DIAGNOSIS — I739 Peripheral vascular disease, unspecified: Secondary | ICD-10-CM

## 2024-02-21 MED ORDER — CILOSTAZOL 50 MG PO TABS: 50.0000 mg | ORAL_TABLET | Freq: Two times a day (BID) | ORAL | 3 refills | Status: DC

## 2024-02-21 MED ORDER — CILOSTAZOL 50 MG PO TABS: 50.0000 mg | ORAL_TABLET | Freq: Two times a day (BID) | ORAL | 3 refills | Status: AC

## 2024-02-21 NOTE — Addendum Note (Signed)
 Addended by: Kaylee Partridge on: 02/21/2024 08:32 AM   Modules accepted: Orders

## 2024-02-21 NOTE — Addendum Note (Signed)
 Addended by: Gates Kasal C on: 02/21/2024 05:58 AM   Modules accepted: Orders

## 2024-02-24 ENCOUNTER — Other Ambulatory Visit: Payer: Self-pay

## 2024-02-24 ENCOUNTER — Other Ambulatory Visit: Payer: Self-pay | Admitting: Family Medicine

## 2024-02-24 DIAGNOSIS — I10 Essential (primary) hypertension: Secondary | ICD-10-CM

## 2024-02-24 MED ORDER — AMLODIPINE BESYLATE 10 MG PO TABS: 10.0000 mg | ORAL_TABLET | Freq: Every day | ORAL | 1 refills | Status: DC

## 2024-03-08 DIAGNOSIS — M25511 Pain in right shoulder: Secondary | ICD-10-CM | POA: Diagnosis not present

## 2024-03-28 DIAGNOSIS — H903 Sensorineural hearing loss, bilateral: Secondary | ICD-10-CM | POA: Diagnosis not present

## 2024-03-28 DIAGNOSIS — R42 Dizziness and giddiness: Secondary | ICD-10-CM | POA: Diagnosis not present

## 2024-03-28 DIAGNOSIS — H9121 Sudden idiopathic hearing loss, right ear: Secondary | ICD-10-CM | POA: Diagnosis not present

## 2024-04-06 ENCOUNTER — Other Ambulatory Visit: Payer: Self-pay | Admitting: Family Medicine

## 2024-04-09 ENCOUNTER — Ambulatory Visit: Payer: Self-pay

## 2024-04-09 ENCOUNTER — Ambulatory Visit (INDEPENDENT_AMBULATORY_CARE_PROVIDER_SITE_OTHER): Admitting: Family Medicine

## 2024-04-09 VITALS — BP 124/74 | HR 74 | Temp 97.8°F | Resp 16 | Ht 69.0 in | Wt 246.6 lb

## 2024-04-09 DIAGNOSIS — M79644 Pain in right finger(s): Secondary | ICD-10-CM | POA: Diagnosis not present

## 2024-04-09 DIAGNOSIS — M25512 Pain in left shoulder: Secondary | ICD-10-CM | POA: Diagnosis not present

## 2024-04-09 NOTE — Telephone Encounter (Signed)
 Patient was booked first availability 04/16/24.  Patient is requesting a sooner appointment.  Please assist with request.   Patient fell 04/08/24 and feels like the finger might be broken. Patient doesn't have xray yet as his pcp could not take them without a large extra cost.  Please call the patient back with any sooner openings- [phone]

## 2024-04-09 NOTE — Patient Instructions (Addendum)
 We will be in touch regarding your X-ray.   Ice/cold pack over area for 10-15 min twice daily.  Heat (pad or rice pillow in microwave) over affected area, 10-15 minutes twice daily.   OK to take Tylenol  1000 mg (2 extra strength tabs) or 975 mg (3 regular strength tabs) every 6 hours as needed.  Please get your X-ray done at the [facility] [place]: [address], [place], [place] [address] [phone]  You do not need an appointment for this location.   Let us  know if you need anything.  Biceps Tendon Disruption (Proximal) Rehab Do exercises exactly as told by your health care provider and adjust them as directed. It is normal to feel mild stretching, pulling, tightness, or discomfort as you do these exercises, but you should stop right away if you feel sudden pain or your pain gets worse.  Stretching and range of motion exercises These exercises warm up your muscles and joints and improve the movement and flexibility of your arm and shoulder. These exercises also help to relieve pain and stiffness. Exercise A: Shoulder flexion, standing   Stand facing a wall. Put your left / right hand on the wall. Slide your left / right hand up the wall. Stop when you feel a stretch in your shoulder, or when you reach the angle recommended by your health care provider. Use your other hand to help raise your arm, if needed. As your hand gets higher, you may need to step closer to the wall. Avoid shrugging your shoulder while you raise your arm. To do this, keep your shoulder blade tucked down toward your spine. Hold for 30 seconds. Slowly return to the starting position. Use your other arm to help, if needed. Repeat 2 times. Complete this exercise 3 times per week. Exercise B: Pendulum   Stand near a wall or a surface that you can hold onto for balance. Bend at the waist and let your left / right arm hang straight down. Use your other arm to support you. Relax your arm and shoulder  muscles, and move your hips and your trunk so your left / right arm swings freely. Your arm should swing because of the motion of your body, not because you are using your arm or shoulder muscles. Keep moving so your arm swings in the following directions, as told by your health care provider: Side to side. Forward and backward. In clockwise and counterclockwise circles. Slowly return to the starting position. Repeat 2 times. Complete this exercise 3 times per week.  Strengthening exercises These exercises build strength and endurance in your arm and shoulder. Endurance is the ability to use your muscles for a long time, even after your muscles get tired. Exercise C: Elbow flexion, neutral  Sit on a stable chair without armrests, or stand. Hold a 3-5 lb weight in your left / right hand, or hold an exercise band with both hands. Your palms should face each other at the starting position. Bend your left / right elbow and move your hand up toward your shoulder. Lead with your thumb, and keep your palm facing the same direction. Keep your other arm straight down, in the starting position. Slowly return to the starting position. Repeat 2-3 times. Complete this exercise 3 times per week. Exercise D: Forearm supination   Sit with your left / right forearm on a table. Your elbow should be below shoulder height. Rest your hand over the edge of the table so your palm  faces down. If directed, hold a hammer with your left / right hand. Without moving your elbow, slowly rotate your hand so your palm faces up toward the ceiling. If you are holding a hammer, begin by holding the hammer near the head. When this exercise gets easier for you, hold the hammer farther down the handle. Hold for 3 seconds. Slowly return to the starting position. Repeat 2 times. Complete this exercise 3 times per week. Exercise E: Scapular retraction   Sit in a stable chair without armrests, or stand. Secure an exercise band  to a stable object in front of you so the band is at shoulder height. Hold one end of the exercise band in each hand. Squeeze your shoulder blades together and move your elbows slightly behind you. Do not shrug your shoulders. Hold for 3 seconds. Slowly return to the starting position. Repeat 2 times. Complete this exercise 3 times per week. Exercise F: Scapular protraction, supine   Lie on your back on a firm surface. Hold a 3-5 lb weight in your left / right hand. Raise your left / right arm straight into the air so your hand is directly above your shoulder joint. Push the weight into the air so your shoulder lifts off of the surface that you are lying on. Do not move your head, neck, or back. Hold for 3 seconds. Slowly return to the starting position. Let your muscles relax completely before you repeat this exercise. Repeat 2 times. Complete this exercise 3 times per week. This information is not intended to replace advice given to you by your health care provider. Make sure you discuss any questions you have with your health care provider. Document Released: 10/11/2005 Document Revised: 06/17/2016 Document Reviewed: 09/19/2015 Elsevier Interactive Patient Education  2017 [employer].

## 2024-04-09 NOTE — Telephone Encounter (Signed)
 FYI Only or Action Required?: FYI only for provider  Patient was last seen in primary care on 02/13/2024 by Copland, Skipper Dumas, MD. Called Nurse Triage reporting Arm Injury. Symptoms began yesterday. Interventions attempted: OTC medications: Tylenol , icy hot, hemp cream and Rest, hydration, or home remedies. Symptoms are: gradually worsening.  Triage Disposition: See HCP Within 4 Hours (Or PCP Triage)  Patient/caregiver understands and will follow disposition?: Yes  Copied from CRM [id number]. Topic: Clinical - Red Word Triage >> Apr 09, 2024 11:15 AM Alethia Huxley E wrote: [facility] that prompted transfer to Nurse Triage: Fall. Patient fell yesterday and his left arm took most of the weight and now he cannot lift his left arm without causing pain. OTC medication not fully helping with pain. Reason for Disposition  [1] SEVERE pain AND [2] not improved 2 hours after pain medicine/ice packs  Answer Assessment - Initial Assessment Questions 1. MECHANISM: How did the injury happen?     04/08/24 fell, landing on left arm.  2. ONSET: When did the injury happen? (Minutes or hours ago)      04/08/24 3. LOCATION: Where is the injury located? Which arm?     left 4. APPEARANCE of INJURY: What does the injury look like?      Mild bruising 5. SEVERITY: Can you use the arm normally?      No-able to move side to side, unable to lift without extreme pain.  6. SWELLING or BRUISING: is there any swelling or bruising? If Yes, ask: How large is it? (e.g., inches, centimeters)      Mild bruising 7. PAIN: Is there pain? If Yes, ask: How bad is the pain?    (Scale 1-10; or mild, moderate, severe)   - NONE (0): No pain.   - MILD (1-3): Doesn't interfere with normal activities.   - MODERATE (4-7): Interferes with normal activities (e.g., work or school) or awakens from sleep.   - SEVERE (8-10): Excruciating pain, unable to do any normal activities, unable to hold a cup of water.     Severe  8.  OTHER SYMPTOMS: Do you have any other symptoms?  (e.g., numbness in hand)     Abrasion  Protocols used: Arm Injury-A-AH

## 2024-04-09 NOTE — Progress Notes (Signed)
 Musculoskeletal Exam  Patient: [facility] DOB: Mar 06, 1951  DOS: 04/09/2024  SUBJECTIVE:  Chief Complaint:   Chief Complaint  Patient presents with   Fall    Left Arm Pain     David Singh is a 72 y.o.  male for evaluation and treatment of L shoulder and R middle finger pain.   Onset:  1 day ago. No inj or change in activity.  Location: Middle right finger, left upper shoulder Character:  throbbing  Progression of issue:  is unchanged Associated symptoms: Swelling and slight decreased range of motion of the middle finger; pain with range of motion on the left shoulder No bruising, redness, catching/locking. Treatment: to date has been ice, OTC NSAIDS, and acetaminophen .   Neurovascular symptoms: no  Past Medical History:  Diagnosis Date   Anxiety 1975   Arthritis 2019   CAD (coronary artery disease)    Cancer (HCC)    basal cell excision upper right ear 7 yrs ago   Cataract 2020   Not bad enough to remove!   Complication of anesthesia    bad pain with anesthesia induction with colonscopy 6 yrs ago   Diabetes mellitus without complication (HCC)    type 2   GERD (gastroesophageal reflux disease)    Hyperlipidemia    Hypertension    Peripheral vascular disease (HCC)    partial clogged artery right leg   Sleep apnea    yrs ago sleep study no cpap used    Objective: VITAL SIGNS: BP 124/74 (BP Location: Left Arm, Patient Position: Sitting)   Pulse 74   Temp 97.8 F (36.6 C) (Oral)   Resp 16   Ht 5' 9 (1.753 m)   Wt 246 lb 9.6 oz (111.9 kg)   SpO2 95%   BMI 36.42 kg/m  Constitutional: Well formed, well developed. No acute distress. Thorax & Lungs: No accessory muscle use Musculoskeletal: Left shoulder.   Decreased active and passive range of motion. Tenderness to palpation: no Deformity: no Ecchymosis: no Tests positive: Speeds Tests negative: Neer's, Hawkins, crossover, internal rotation/liftoff There is also TTP and soft tissue edema  noted over the third PIP.  Decreased range of motion secondary to swelling. Neurologic: Normal sensory function. No focal deficits noted.  Grip strength adequate Psychiatric: Normal mood. Age appropriate judgment and insight. Alert & oriented x 3.    Assessment:  Acute pain of left shoulder  Pain of right middle finger - Plan: DG Finger Middle Right  Plan: Suspect proximal biceps tendon involvement.  Stretches/exercises, heat, ice, Tylenol .  He will go back on meloxicam . Check x-ray to rule out fracture involving the joint.  Consider buddy taping.  Ice, Tylenol . F/u as originally scheduled. The patient voiced understanding and agreement to the plan.   Shellie Dials [place], DO 04/09/24  3:06 PM

## 2024-04-10 ENCOUNTER — Encounter: Payer: Self-pay | Admitting: Family Medicine

## 2024-04-10 DIAGNOSIS — R413 Other amnesia: Secondary | ICD-10-CM

## 2024-04-10 NOTE — Telephone Encounter (Signed)
 The patient called back. He was read the directive from the nurse. He decided to keep the appointment and he was added to the wait list. There were no more questions

## 2024-04-10 NOTE — Telephone Encounter (Signed)
 No sooner appointments available with requested Provider.  Patient can be scheduled with any Orthopedic provider for evaluation of this injury.

## 2024-04-10 NOTE — Telephone Encounter (Signed)
 Called and Left voicemail for pt to call back to get directive below.

## 2024-04-16 DIAGNOSIS — M25512 Pain in left shoulder: Secondary | ICD-10-CM | POA: Diagnosis not present

## 2024-04-16 DIAGNOSIS — M79644 Pain in right finger(s): Secondary | ICD-10-CM | POA: Diagnosis not present

## 2024-04-16 NOTE — Progress Notes (Signed)
 Orthopedics Consult Note     Assessment:  Reviewed clinical exam, radiographs and discussed with the patient treatment recommendations for right middle finger PIP sprain including observation, immobilization, home exercises furthermore discussed treatment options for left shoulder concern for rotator cuff tear including activity modification and observation ice home exercise program steroid injection MRI at the present time patient elects to proceed     Plan:  1) right middle finger PIP sprain brace at night, home exercises for range of motion follow-up in 4 weeks 2) left shoulder ice, home exercise program follow-up in 2 weeks      History of Present Illness:  Chief Complaint: Right middle finger and left shoulder pain after fall  73 year old right-hand-dominant male presents today with complaint of right middle finger pain and left shoulder pain after a fall that he sustained on 04/08/2024.  He rates his current pain a 4 at rest but a 9 when he attempts to move his finger.  He has been wearing an AlumaFoam style splint to protect it.  Patient noticed after the fall he has pain when trying to lift his left shoulder overhead.  He describes his pain as sharp and aching.  Better with the splint to protect his finger trying to bend the finger or if he hits the finger it hurts.  No previous surgery.  Shoulder pain is 0-4 when its at its side 6 out of 10 when trying to lift his arm.  He reports he had no swelling in his shoulder.    History and Physical standard intake form from today's or previous visit is reviewed, pertinent information is noted and otherwise scanned into the patient's permanent medical record for future use.  Allergies  Dexlansoprazole   Medications    Current Medications[1]   Past Medical History  Medical History[2]   Past Surgical History  Surgical History[3]   Family History  Family History[4]   Social History:  Social History[5]   Review of  Systems  Review of Systems     Vital Signs  BP 124/75   Pulse 65   Temp 98 F (36.7 C)   Ht 1.753 m (5' 9.02)   Wt 111 kg (245 lb)   SpO2 98%   BMI 36.16 kg/m  Body mass index is 36.16 kg/m.    Physical Exam  Right middle finger: Edema at the proximal interphalangeal joint no erythema no open wounds no abrasions.  Sensations intact radially and ulnarly capillary refills less than 2 seconds.  Nontender palpation of the DIP joint stable to radial ulnar stress.  Tender palpation at the PIP joint stable to radial and ulnar stress.  Decreased active range of motion  Left shoulder: No atrophy protracted shoulders.  No open wounds no abrasions sensations intact to light touch axillary nerve, superficial radial median ulnar nerve distribution.  Radial pulse 2+ active range of motion shoulder flexion 160 abduction 130 external rotation 40 internal rotation 70.  Passive range of motion equals active range of motion.  Strength shoulder flexion is 5\5 abduction 4\5 internal rotation external rotation 55.  Hawkins negative Neer's positive.  XRays:  Radiology Results (last 14 days)     Procedure Component Value Units Date/Time   XR Shoulder Minimum 2 Views Left [[id number]] Resulted: 04/16/24 1203   Order Status: Completed Updated: 04/16/24 1203   Narrative:     No acute fractures, dislocations, abnormal lesions.  Grade 2 AC separation   XR Fingers Minimum 2 Views Right [[id number]] Resulted: 04/16/24 1203  Order Status: Completed Updated: 04/16/24 1203   Narrative:     No acute fractures, dislocations, abnormal lesions.  Congruent proximal  interphalangeal joint and distal interphalangeal joint       Radiology Results (last 72 hours)     ** No results found for the last 72 hours. **          Problem List  Problem List[6]      David Singh, ATC 04/16/2024 11:16 AM  Note - This record has been created using [employer]. Chart creation errors have been sought,  but may not always have been located. Such creation errors do not reflect on the standard of medical care.        [1] Current Outpatient Medications  Medication Sig Dispense Refill  . allopurinoL  (ZYLOPRIM ) 100 mg tablet Take 100 mg by mouth daily.    . amLODIPine  (NORVASC ) 10 mg tablet Take 10 mg by mouth Once Daily.    SABRA aspirin 81 mg EC tablet Take 81 mg by mouth daily.    SABRA b complex vitamins cap capsule Take 1 capsule by mouth Once Daily.    . bacitracin (AK-TRACIN) 500 unit/gram oint Apply to incision behind the ear twice daily 3.5 g 0  . busPIRone  (BUSPAR ) 15 mg tablet Take 15 mg by mouth 2 (two) times a day.    . carvediloL  (COREG ) 25 mg tablet Take 25 mg by mouth in the morning and 25 mg in the evening. Take with meals.    . cholecalciferol (VITAMIN D3) 2,000 unit tablet Take 2,000 Units by mouth Once Daily.    . cilostazoL  (PLETAL ) 50 mg tablet Take 50 mg by mouth 2 (two) times a day. 200 tablet 2  . cloNIDine  (CATAPRES ) 0.2 mg tablet Take 0.2 mg by mouth 2 (two) times a day.    . coenzyme Q-10 (Co Q-10) 300 mg capsule Take 300 mg by mouth Once Daily.    . cyanocobalamin, vitamin B-12, (Vitamin B-12) 5,000 mcg Place 5,000 mcg under the tongue daily.    . ergocalciferol (VITAMIN D2) 1,250 mcg (50,000 unit) capsule Take 1 capsule (50,000 Units total) by mouth once a week for 6 doses. 6 capsule 0  . fluticasone propionate (FLONASE) 50 mcg/spray nasal spray 2 sprays into each nostril once daily or 1 spray into each nostril twice daily as needed. 48 mL 15  . lisinopriL  (PRINIVIL ) 10 mg tablet Take 10 mg by mouth daily. TAKE 1-2 TABLETS (10-20 MG TOTAL) BY MOUTH DAILY. (Patient not taking: Reported on 03/28/2024)    . lisinopriL  (PRINIVIL ) 40 mg tablet Take 40 mg by mouth 2 (two) times a day.    . meloxicam  (MOBIC ) 15 mg tablet Take 15 mg by mouth as needed for mild pain (1-3).    . metFORMIN  (GLUCOPHAGE ) 1,000 mg tablet Take 1,000 mg by mouth 2 (two) times a day. 180 tablet 3  .  multivitamin (THERAGRAN) tab tablet Take 1 tablet by mouth Once Daily.    . niacin  (NIACOR ) 500 mg tablet Take 500 mg by mouth Once Daily.    SABRA omega-3 acid ethyl esters (LOVAZA) 1 gram capsule Take 1 capsule by mouth daily.    . pantoprazole  (PROTONIX ) 40 mg EC tablet Take 40 mg by mouth Once Daily.    . rosuvastatin  (CRESTOR ) 40 mg tablet Take 40 mg by mouth Once Daily.    . sertraline (ZOLOFT) 50 mg tablet Take 50 mg by mouth every morning.    . spironolactone  (ALDACTONE ) 25 mg tablet  Take 25 mg by mouth Once Daily.    . traMADoL (ULTRAM) 50 mg tablet Take 1 tablet (50 mg total) by mouth every 6 (six) hours as needed for moderate pain (4-6). 8 tablet 0  . zinc gluconate 50 mg tab tablet Take 50 mg by mouth daily.     No current facility-administered medications for this visit.  [2] Past Medical History: Diagnosis Date  . Adverse effect of anesthesia    Had a colonascpe what ever they gave me made my head feel like it was going to explode, it hurt my head,I thought I was dying!  SABRA Anxiety   . Arthritis    knees and right thumb  . Basal cell carcinoma (BCC) of right ear    Excised  . BPH (benign prostatic hyperplasia)   . Cataract   . Cervical spondylosis   . Chronic gout   . Chronic nasal congestion   . Coronary artery disease   . GERD (gastroesophageal reflux disease)   . Heart block   . High cholesterol   . HL (hearing loss) 01/2023   Double ear infection total hearing loss right ear.  . Hypertension with goal to be determined   . Joint pain    Arthritis  . Obesity   . OSA (obstructive sleep apnea)    could not tolerate Cpap  . PAD (peripheral artery disease)   . Stented coronary artery 11/2007   [place]   . Syncope   . Type 2 diabetes mellitus    (CMD)    Had it most my life  [3] Past Surgical History: Procedure Laterality Date  . COLONOSCOPY     Procedure: COLONOSCOPY  . CORONARY ANGIOPLASTY WITH STENT PLACEMENT    . KNEE SURGERY Right    Procedure: KNEE  SURGERY; Arthroscopic  . NOSE SURGERY     Procedure: NOSE SURGERY; Septoplasty  . OTHER SURGICAL HISTORY Right    Procedure: OTHER SURGICAL HISTORY (abdominal aortogram); [facility], patient states unsuccessful  . PROSTATE SURGERY     Enlarged, shaved down.  SABRA SKIN CANCER EXCISION     Procedure: SKIN CANCER EXCISION; right ear  . TONSILLECTOMY     Procedure: TONSILLECTOMY  . TOTAL KNEE ARTHROPLASTY Right 09/07/2023   ARTHROPLASTY KNEE TOTAL REPLACEMENT - SDD performed by Vinie Carlin Fonder, MD at [facility] OR  . TYMPANOMASTOIDECTOMY W/ OSSICULOPLASTY Right 01/26/2024   TYMPANOMASTOIDECTOMY performed by Tilford Luann Bowler, MD at [facility] OR  . UPPER GASTROINTESTINAL ENDOSCOPY     Procedure: UPPER GASTROINTESTINAL ENDOSCOPY  [4] Family History Problem Relation Name Age of Onset  . Hypertension Mother Mother   . Heart attack Mother Mother   . Diabetes Mother Mother   . Hyperlipidemia Mother Mother   . Lung disease Father Father   . Hypertension Father Father   . Hyperlipidemia Father Father   . Alcohol abuse Brother Younger brother   . Prostate cancer Brother    [5] Social History Socioeconomic History  . Marital status: Married  Tobacco Use  . Smoking status: Former    Current packs/day: 0.00    Types: Cigarettes    Quit date: 01/02/1998    Years since quitting: 26.3  . Smokeless tobacco: Never  Substance and Sexual Activity  . Alcohol use: Yes    Alcohol/week: 1.0 standard drink of alcohol    Comment: seldom  . Drug use: Not Currently   Social Drivers of Health   Food Insecurity: No Food Insecurity (04/09/2024)   Received from [facility]   Food  vital sign   . Within the past 12 months, you worried that your food would run out before you got money to buy more: Never true   . Within the past 12 months, the food you bought just didn't last and you didn't have money to get more: Never true  Transportation Needs: No Transportation Needs (04/09/2024)   Received from [facility] - Transportation   . In the past 12 months, has lack of transportation kept you from medical appointments or from getting medications?: No   . In the past 12 months, has lack of transportation kept you from meetings, work, or from getting things needed for daily living?: No  Safety: Low Risk  (09/07/2023)   Safety   . How often does anyone, including family and friends, physically hurt you?: Never   . How often does anyone, including family and friends, insult or talk down to you?: Never   . How often does anyone, including family and friends, threaten you with harm?: Never   . How often does anyone, including family and friends, scream or curse at you?: Never  Living Situation: Unknown (04/09/2024)   Received from [facility] Situation   . In the last 12 months, was there a time when you were not able to pay the mortgage or rent on time?: Patient declined   . In the past 12 months, how many times have you moved where you were living?: 0   . At any time in the past 12 months, were you homeless or living in a shelter (including now)?: Patient declined  [6] Patient Active Problem List Diagnosis  . PAD (peripheral artery disease) (HCC)  . CAD in native artery  . Chronic gout  . Essential hypertension  . History of coronary artery stent placement  . Hypertriglyceridemia  . Type 2 diabetes mellitus with diabetic peripheral angiopathy without gangrene, without long-term current use of insulin (HCC)  . DDD (degenerative disc disease), cervical  . Cervical spondylosis  . Mixed hyperlipidemia  . Morbid obesity (HCC)  . Type 2 diabetes mellitus without complication, without long-term current use of insulin (HCC)  . Nuclear sclerotic cataract of both eyes  . Cortical age-related cataract, both eyes  . Dermatochalasis of both upper eyelids  . Brow ptosis  . Hyperopia with astigmatism and presbyopia, bilateral  . Sensorineural hearing loss (SNHL) of both ears  . Impacted  cerumen of left ear  . Sensorineural hearing loss (SNHL) of right ear with restricted hearing of left ear  . Sensorineural hearing loss (SNHL), bilateral  . Chronic pain of right knee  . Decreased range of motion of right knee  . Osteoarthritis, knee  . Impaired mobility and ADLs  . Abnormality of gait and mobility

## 2024-04-19 ENCOUNTER — Encounter: Payer: Self-pay | Admitting: Family Medicine

## 2024-04-19 DIAGNOSIS — R2681 Unsteadiness on feet: Secondary | ICD-10-CM

## 2024-04-23 NOTE — Telephone Encounter (Signed)

## 2024-04-27 ENCOUNTER — Other Ambulatory Visit: Payer: Self-pay | Admitting: Family Medicine

## 2024-04-27 DIAGNOSIS — I10 Essential (primary) hypertension: Secondary | ICD-10-CM

## 2024-05-18 ENCOUNTER — Other Ambulatory Visit: Payer: Self-pay | Admitting: Family Medicine

## 2024-05-18 DIAGNOSIS — R413 Other amnesia: Secondary | ICD-10-CM | POA: Diagnosis not present

## 2024-05-18 DIAGNOSIS — F411 Generalized anxiety disorder: Secondary | ICD-10-CM

## 2024-05-18 DIAGNOSIS — M109 Gout, unspecified: Secondary | ICD-10-CM

## 2024-05-18 DIAGNOSIS — I10 Essential (primary) hypertension: Secondary | ICD-10-CM

## 2024-05-18 DIAGNOSIS — H9193 Unspecified hearing loss, bilateral: Secondary | ICD-10-CM | POA: Diagnosis not present

## 2024-05-21 DIAGNOSIS — S63692D Other sprain of right middle finger, subsequent encounter: Secondary | ICD-10-CM | POA: Diagnosis not present

## 2024-05-21 DIAGNOSIS — M7542 Impingement syndrome of left shoulder: Secondary | ICD-10-CM | POA: Diagnosis not present

## 2024-05-22 DIAGNOSIS — G4733 Obstructive sleep apnea (adult) (pediatric): Secondary | ICD-10-CM | POA: Diagnosis not present

## 2024-06-03 ENCOUNTER — Other Ambulatory Visit: Payer: Self-pay | Admitting: Family Medicine

## 2024-06-29 DIAGNOSIS — R413 Other amnesia: Secondary | ICD-10-CM | POA: Diagnosis not present

## 2024-07-09 DIAGNOSIS — R413 Other amnesia: Secondary | ICD-10-CM | POA: Diagnosis not present

## 2024-07-21 ENCOUNTER — Other Ambulatory Visit: Payer: Self-pay | Admitting: Family Medicine

## 2024-07-21 DIAGNOSIS — M109 Gout, unspecified: Secondary | ICD-10-CM

## 2024-07-21 DIAGNOSIS — I10 Essential (primary) hypertension: Secondary | ICD-10-CM

## 2024-07-21 DIAGNOSIS — F411 Generalized anxiety disorder: Secondary | ICD-10-CM

## 2024-07-23 DIAGNOSIS — G918 Other hydrocephalus: Secondary | ICD-10-CM | POA: Diagnosis not present

## 2024-07-30 ENCOUNTER — Ambulatory Visit: Admitting: Family Medicine

## 2024-08-06 DIAGNOSIS — M25512 Pain in left shoulder: Secondary | ICD-10-CM | POA: Diagnosis not present

## 2024-08-06 DIAGNOSIS — M25812 Other specified joint disorders, left shoulder: Secondary | ICD-10-CM | POA: Diagnosis not present

## 2024-08-07 NOTE — Progress Notes (Signed)
 David Singh                                          MRN: [id number]   08/07/2024   The [facility] Quality Team Specialist reviewed this patient medical record for the purposes of chart review for care gap closure. The following were reviewed: chart review for care gap closure-kidney health evaluation for diabetes:eGFR  and uACR. Appt 11/5    [facility] Quality Team

## 2024-08-17 DIAGNOSIS — I739 Peripheral vascular disease, unspecified: Secondary | ICD-10-CM | POA: Diagnosis not present

## 2024-08-17 DIAGNOSIS — I745 Embolism and thrombosis of iliac artery: Secondary | ICD-10-CM | POA: Diagnosis not present

## 2024-08-20 ENCOUNTER — Ambulatory Visit: Admitting: Family Medicine

## 2024-08-26 NOTE — Patient Instructions (Incomplete)
 It was good to see you again today, I will be in touch with your labs I would recommend getting 1 dose of RSV vaccine at your pharmacy

## 2024-08-26 NOTE — Progress Notes (Deleted)
 [facility] [address] [place], [place] [address] [phone]  Date:  08/29/2024   Name:  David Singh   DOB:  16-Apr-1951   MRN:  [id number]  PCP:  David Harlene BROCKS, MD    Chief Complaint: No chief complaint on file.   History of Present Illness:  [name] is a 73 y.o. very pleasant male patient who presents with the following:  Patient seen today for physical exam.  I saw him most recently in April for periodic follow-up History of diabetes, CAD with stent, hyperlipidemia, hypertension, significant peripheral arterial disease.  He is also now being followed by neurosurgery through [facility] for hydrocephalus and also neurology for cognitive decline He notes his PAD is stable.  He can walk about the same distance before he gets pain He had a knee replacement about a year ago which went well He was looking into cochlear implant but it turned out he was not a good candidate A1c in the spring looked reasonable, 7.3% He was low on iron-likely exacerbated by knee replacement surgery.  I did want him to see hematology, and have referred him to hematology at [facility] in [place]-?  If he end up being seen He did follow-up with vascular surgery through [facility] last month-they noted ABIs are stable, minimal limitations from his claudication at doing better after his knee replacement - PAD with nonlifestyle limiting claudication - Cont ASA and statin. F/u in 12 months with ABIs.  - HTN - Controlled, continue medication - HLD - Controlled, continue medication HPI:  [name] with hx of HTN, HLD, DM2, CAD s/p stenting, and obesity who is being seen in follow-up for claudication s/p prior unsuccessful right iliac recanalization.   He also saw neurosurgery through [facility] in September of this year to follow-up abnormal head CT-ventricular enlargement.  On exam he has been noted to have cognitive  decline mpression: The patient was seen with Dr. Scharlene. He appears to have hydrocephalus ex vacuo and does not appear to have normal pressure hydrocephalus. He does appear to have cognitive issues that will need to be addressed and followed. He does not require any neurosurgical intervention at present. The patient and wife are so advised. The patient is willing to undergo MRI without sedation and open bore MRI or close bore MRI as long with his whole body does not have to go into the machine. All questions were answered to the best of our ability. No neurosurgery follow-up needed. Assessment:  1. Hydrocephalus ex vacuo (CMD)  Plan: Follow-up with neurology and follow-up with neurosurgery if neurology workup for hydrocephalus is so indicated after LP and/or MRI with and without contrast shows findings consistent with normal pressure hydrocephalus.   - He was referred to neurosurgery by neurology who saw him also in September.  They thought he might have normal pressure hydrocephalus but neurosurgery did not feel this was the case  -A1c can be updated - Eye exam - Patient has refused COVID vaccination - Needs urine micro Flu shot, pneumonia, Shingrix all up-to-date Cologuard up-to-date  Amlodipine  Clonidine  Lisinopril  Spironolactone  25 Crestor  40 Carvedilol  Pletal  BuSpar  Metformin  one thousand twice daily  In April we completed a BMP, ferritin low at 12.5, CBC, A1c  Patient Active Problem List   Diagnosis Date Noted   Primary osteoarthritis of right knee 03/03/2020   Chronic gout 03/09/2016   Hypertriglyceridemia 01/16/2015   Type 2 diabetes mellitus (HCC) 01/06/2015  CAD in native artery 01/06/2015   History of coronary artery stent placement 01/06/2015   PAD (peripheral artery disease) 01/06/2015   Hyperlipidemia 01/06/2015   Essential hypertension 01/06/2015    Past Medical History:  Diagnosis Date   Anxiety 1975   Arthritis 2019   CAD (coronary artery disease)     Cancer (HCC)    basal cell excision upper right ear 7 yrs ago   Cataract 2020   Not bad enough to remove!   Complication of anesthesia    bad pain with anesthesia induction with colonscopy 6 yrs ago   Diabetes mellitus without complication (HCC)    type 2   GERD (gastroesophageal reflux disease)    Hyperlipidemia    Hypertension    Peripheral vascular disease    partial clogged artery right leg   Sleep apnea    yrs ago sleep study no cpap used    Past Surgical History:  Procedure Laterality Date   ABDOMINAL AORTOGRAM W/LOWER EXTREMITY N/A 12/27/2016   Procedure: Abdominal Aortogram w/Lower Extremity;  Surgeon: David GORMAN Blade, MD;  Location: [facility] INVASIVE CV LAB;  Service: Cardiovascular;  Laterality: N/A;   CARDIAC SURGERY     CORONARY ANGIOPLASTY WITH STENT PLACEMENT  11/2004   in [place]    MENISCUS REPAIR Right    NASAL SINUS SURGERY  1976   TONSILLECTOMY      Social History   Tobacco Use   Smoking status: Former    Current packs/day: 0.00    Average packs/day: 1.5 packs/day for 26.0 years (39.0 ttl pk-yrs)    Types: Cigarettes    Start date: 44    Quit date: 1994    Years since quitting: 31.8   Smokeless tobacco: Former  Building Services Engineer status: Never Used  Substance Use Topics   Alcohol use: Yes    Comment: About 12 drinks a year!   Drug use: No    Family History  Problem Relation Age of Onset   Alcohol abuse Brother    Cancer Brother    Diabetes Brother    Hyperlipidemia Brother    Hypertension Brother    Early death Brother    Diabetes Mother    Hypertension Mother    Hyperlipidemia Mother    Heart attack Mother    Anxiety disorder Mother    Heart disease Mother    Hyperlipidemia Father    Hypertension Father    Cancer Father    Cancer Brother     Allergies  Allergen Reactions   Dexilant [Dexlansoprazole]     Pressure in stomach and chest    Medication list has been reviewed and updated.  Current Outpatient Medications on  File Prior to Visit  Medication Sig Dispense Refill   allopurinol  (ZYLOPRIM ) 100 MG tablet TAKE 1 TABLET BY MOUTH DAILY. INCREASE AS DIRECTED BY MD 90 tablet 0   amLODipine  (NORVASC ) 10 MG tablet TAKE 1 TABLET BY MOUTH EVERY DAY 90 tablet 0   aspirin 81 MG tablet Take 81 mg by mouth daily.     busPIRone  (BUSPAR ) 15 MG tablet TAKE 1 TABLET BY MOUTH TWICE A DAY 180 tablet 0   carvedilol  (COREG ) 25 MG tablet TAKE 1 TABLET BY MOUTH TWICE A DAY 180 tablet 0   Cholecalciferol (VITAMIN D3) 2000 units TABS Take 2,000 Units by mouth daily.     cilostazol  (PLETAL ) 50 MG tablet Take 1 tablet (50 mg total) by mouth 2 (two) times daily. 180 tablet 3   cloNIDine  (CATAPRES ) 0.2  MG tablet TAKE 1 TABLET BY MOUTH 2 TIMES DAILY. 180 tablet 0   Coenzyme Q10 300 MG CAPS Take 300 mg by mouth daily.      Cyanocobalamin (VITAMIN B-12 PO) Take 2,500 mg by mouth daily.     glucose blood (FREESTYLE LITE) test strip Check blood sugar 1-3x daily 100 each 12   lisinopril  (ZESTRIL ) 10 MG tablet Take 1-2 tablets (10-20 mg total) by mouth daily. 180 tablet 1   meloxicam  (MOBIC ) 15 MG tablet Take 1 tablet (15 mg total) by mouth daily as needed for pain. 90 tablet 1   metFORMIN  (GLUCOPHAGE ) 1000 MG tablet TAKE 1 TABLET BY MOUTH TWICE  DAILY WITH A MEAL 200 tablet 2   MULTIPLE VITAMIN PO Take 1 tablet by mouth daily.      niacin  500 MG tablet Take 500 mg by mouth every morning.      pantoprazole  (PROTONIX ) 40 MG tablet Take 1 tablet (40 mg total) by mouth daily. 90 tablet 1   rosuvastatin  (CRESTOR ) 40 MG tablet TAKE 1 TABLET BY MOUTH EVERY DAY 90 tablet 0   spironolactone  (ALDACTONE ) 25 MG tablet Take 1 tablet (25 mg total) by mouth daily. 90 tablet 3   No current facility-administered medications on file prior to visit.    Review of Systems:  As per HPI- otherwise negative.   Physical Examination: There were no vitals filed for this visit. There were no vitals filed for this visit. There is no height or weight on file  to calculate BMI. Ideal Body Weight:    GEN: no acute distress. HEENT: Atraumatic, Normocephalic.  Ears and Nose: No external deformity. CV: RRR, No M/G/R. No JVD. No thrill. No extra heart sounds. PULM: CTA B, no wheezes, crackles, rhonchi. No retractions. No resp. distress. No accessory muscle use. ABD: S, NT, ND, +BS. No rebound. No HSM. EXTR: No c/c/e PSYCH: Normally interactive. Conversant.    Assessment and Plan: No diagnosis found.  Assessment & Plan   Signed Harlene Schroeder, MD

## 2024-08-27 ENCOUNTER — Ambulatory Visit: Admitting: Family Medicine

## 2024-08-29 ENCOUNTER — Encounter: Admitting: Family Medicine

## 2024-08-29 DIAGNOSIS — I10 Essential (primary) hypertension: Secondary | ICD-10-CM

## 2024-08-29 DIAGNOSIS — E785 Hyperlipidemia, unspecified: Secondary | ICD-10-CM

## 2024-08-29 DIAGNOSIS — I739 Peripheral vascular disease, unspecified: Secondary | ICD-10-CM

## 2024-08-29 DIAGNOSIS — R413 Other amnesia: Secondary | ICD-10-CM

## 2024-08-29 DIAGNOSIS — Z125 Encounter for screening for malignant neoplasm of prostate: Secondary | ICD-10-CM

## 2024-08-29 DIAGNOSIS — D509 Iron deficiency anemia, unspecified: Secondary | ICD-10-CM

## 2024-08-29 DIAGNOSIS — E1159 Type 2 diabetes mellitus with other circulatory complications: Secondary | ICD-10-CM

## 2024-08-31 ENCOUNTER — Telehealth: Payer: Self-pay | Admitting: Pharmacist

## 2024-08-31 NOTE — Progress Notes (Signed)
 Pharmacy Quality Measure Review  This patient is appearing on a report for being at risk of failing the adherence measure for cholesterol (statin) medications this calendar year.   Medication: rosuvastatin  40mg   Last fill date: 06/04/2024 for 90 day supply  Patient also is taking lisinopril  10mg  - directions are 1 or 2 tablets daily but patient is only taking 10mg  daily - home blood pressure has been 120's to 130's  / 80's  Discussed blood glucose as well. He has metformin  on his med list but has not filled in 2025. Patient states he has plenty of metformin  on hand - he is taking 1000mg  only once a day (directions are twice a day) last A1c was 7.1%. Home blood glucose as been 90 to 120 fasting and 130 to 180 after meals.   Reviewed medication indication, dosing, and goals of therapy.  and Contacted pharmacy to facilitate refills. Patient requested only that rosuvastatin  be refilled.   Reminded patient about follow up with Dr Watt, There was an issue with appt this past week. He will call office to reschedule.   Madelin Ray, PharmD Clinical Pharmacist [facility] Primary [facility]  Population Health [phone]
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
## 2024-09-05 ENCOUNTER — Other Ambulatory Visit: Payer: Self-pay | Admitting: Family Medicine

## 2024-09-05 DIAGNOSIS — E119 Type 2 diabetes mellitus without complications: Secondary | ICD-10-CM

## 2024-09-06 ENCOUNTER — Encounter: Payer: Self-pay | Admitting: Family Medicine

## 2024-09-06 ENCOUNTER — Ambulatory Visit: Admitting: Family Medicine

## 2024-09-06 VITALS — BP 118/78 | HR 78 | Ht 69.0 in | Wt 244.0 lb

## 2024-09-06 DIAGNOSIS — I10 Essential (primary) hypertension: Secondary | ICD-10-CM | POA: Diagnosis not present

## 2024-09-06 DIAGNOSIS — Z7984 Long term (current) use of oral hypoglycemic drugs: Secondary | ICD-10-CM

## 2024-09-06 DIAGNOSIS — I739 Peripheral vascular disease, unspecified: Secondary | ICD-10-CM

## 2024-09-06 DIAGNOSIS — D509 Iron deficiency anemia, unspecified: Secondary | ICD-10-CM | POA: Diagnosis not present

## 2024-09-06 DIAGNOSIS — E785 Hyperlipidemia, unspecified: Secondary | ICD-10-CM

## 2024-09-06 DIAGNOSIS — E1159 Type 2 diabetes mellitus with other circulatory complications: Secondary | ICD-10-CM | POA: Diagnosis not present

## 2024-09-06 DIAGNOSIS — E1129 Type 2 diabetes mellitus with other diabetic kidney complication: Secondary | ICD-10-CM

## 2024-09-06 LAB — FERRITIN: Ferritin: 14.3 ng/mL — ABNORMAL LOW (ref 22.0–322.0)

## 2024-09-06 LAB — LIPID PANEL
Cholesterol: 120 mg/dL (ref 0–200)
HDL: 44 mg/dL (ref >39.00)
LDL Cholesterol: 49 mg/dL (ref 0–99)
NonHDL: 75.76
Total CHOL/HDL Ratio: 3
Triglycerides: 136 mg/dL (ref 0.0–149.0)
VLDL: 27.2 mg/dL (ref 0.0–40.0)

## 2024-09-06 LAB — COMPREHENSIVE METABOLIC PANEL WITH GFR
ALT: 21 U/L (ref 0–53)
AST: 17 U/L (ref 0–37)
Albumin: 4.2 g/dL (ref 3.5–5.2)
Alkaline Phosphatase: 39 U/L (ref 39–117)
BUN: 13 mg/dL (ref 6–23)
CO2: 23 meq/L (ref 19–32)
Calcium: 9.3 mg/dL (ref 8.4–10.5)
Chloride: 100 meq/L (ref 96–112)
Creatinine, Ser: 1.13 mg/dL (ref 0.40–1.50)
GFR: 64.84 mL/min (ref >60.00)
Glucose, Bld: 114 mg/dL — ABNORMAL HIGH (ref 70–99)
Potassium: 4.4 meq/L (ref 3.5–5.1)
Sodium: 133 meq/L — ABNORMAL LOW (ref 135–145)
Total Bilirubin: 0.5 mg/dL (ref 0.2–1.2)
Total Protein: 6.6 g/dL (ref 6.0–8.3)

## 2024-09-06 LAB — CBC
HCT: 38.7 % — ABNORMAL LOW (ref 39.0–52.0)
Hemoglobin: 13.1 g/dL (ref 13.0–17.0)
MCHC: 34 g/dL (ref 30.0–36.0)
MCV: 85.2 fl (ref 78.0–100.0)
Platelets: 165 K/uL (ref 150.0–400.0)
RBC: 4.54 Mil/uL (ref 4.22–5.81)
RDW: 15.7 % — ABNORMAL HIGH (ref 11.5–15.5)
WBC: 5.4 K/uL (ref 4.0–10.5)

## 2024-09-06 LAB — MICROALBUMIN / CREATININE URINE RATIO
Creatinine,U: 98.8 mg/dL
Microalb Creat Ratio: 31.1 mg/g — ABNORMAL HIGH (ref 0.0–30.0)
Microalb, Ur: 3.1 mg/dL — ABNORMAL HIGH (ref 0.0–1.9)

## 2024-09-06 LAB — HEMOGLOBIN A1C: Hgb A1c MFr Bld: 7.2 % — ABNORMAL HIGH (ref 4.6–6.5)

## 2024-09-06 NOTE — Patient Instructions (Signed)
 Good to see you !  I will be in touch with your labs Avoid driving at night, in bad weather, or if you feel tired.  Recommend sticking to driving in familiar areas   Please let me know if your symptoms are changing or getting worse

## 2024-09-07 ENCOUNTER — Encounter: Payer: Self-pay | Admitting: Family Medicine

## 2024-09-07 MED ORDER — EMPAGLIFLOZIN 10 MG PO TABS
10.0000 mg | ORAL_TABLET | Freq: Every day | ORAL | 3 refills | Status: AC
Start: 2024-09-07 — End: ?

## 2024-09-09 NOTE — Addendum Note (Signed)
 Addended by: WATT RAISIN C on: 09/09/2024 08:00 PM   Modules accepted: Orders

## 2024-09-12 ENCOUNTER — Telehealth: Payer: Self-pay | Admitting: Pharmacist

## 2024-09-12 ENCOUNTER — Encounter: Payer: Self-pay | Admitting: Family Medicine

## 2024-09-12 NOTE — Progress Notes (Signed)
 09/12/2024 Name: David Singh MRN: [id number] DOB: [birth year]  Chief Complaint  Patient presents with   Medication Problem    Cost of Jardiance    David Singh is a 73 y.o. year old male who presented for a telephone visit.   They were referred to the pharmacist by their PCP for assistance in managing medication access.    Subjective: Patient was prescribed Jardiance at recent follow up visit with PCP. He reports that cost of Jardiance was too high. For 90 day supply it was $390+   Medication Access/Adherence  Current Pharmacy:  [id number] - [place], [place] - [address] [place] [address] Phone: [phone] Fax: [phone]   Patient reports affordability concerns with their medications: Yes  Patient reports access/transportation concerns to their pharmacy: No  Patient reports adherence concerns with their medications:  Yes  - due to cost     Objective:  Lab Results  Component Value Date   HGBA1C 7.2 (H) 09/06/2024    Lab Results  Component Value Date   CREATININE 1.13 09/06/2024   BUN 13 09/06/2024   NA 133 (L) 09/06/2024   K 4.4 09/06/2024   CL 100 09/06/2024   CO2 23 09/06/2024    Lab Results  Component Value Date   CHOL 120 09/06/2024   HDL 44.00 09/06/2024   LDLCALC 49 09/06/2024   LDLDIRECT 53.0 02/17/2022   TRIG 136.0 09/06/2024   CHOLHDL 3 09/06/2024    Medications Reviewed Today     Reviewed by Carla Milling, RPH-CPP (Pharmacist) on 09/12/24 at 0824  Med List Status: <None>   Medication Order Taking? Sig Documenting Provider Last Dose Status Informant  allopurinol  (ZYLOPRIM ) 100 MG tablet [id number]  TAKE 1 TABLET BY MOUTH DAILY. INCREASE AS DIRECTED BY MD Copland, Harlene BROCKS, MD  Active   amLODipine  (NORVASC ) 10 MG tablet [id number]  TAKE 1 TABLET BY MOUTH EVERY DAY Copland, Jessica C, MD  Active   aspirin 81 MG tablet [id number]  Take 81 mg by mouth daily. [provider]  Active Self  busPIRone  (BUSPAR ) 15 MG tablet [id number]  TAKE 1 TABLET BY MOUTH TWICE A DAY Copland, Jessica C, MD  Active   carvedilol  (COREG ) 25 MG tablet [id number]  TAKE 1 TABLET BY MOUTH TWICE A DAY Copland, Jessica C, MD  Active   Cholecalciferol (VITAMIN D3) 2000 units TABS [id number]  Take 2,000 Units by mouth daily. [provider]  Active   cilostazol  (PLETAL ) 50 MG tablet [id number]  Take 1 tablet (50 mg total) by mouth 2 (two) times daily. Copland, Harlene BROCKS, MD  Active   cloNIDine  (CATAPRES ) 0.2 MG tablet [id number]  TAKE 1 TABLET BY MOUTH 2 TIMES DAILY. Copland, Harlene BROCKS, MD  Active   Coenzyme Q10 300 MG CAPS [id number]  Take 300 mg by mouth daily.  [provider]  Active Self  Cyanocobalamin (VITAMIN B-12 PO) [id number]  Take 2,500 mg by mouth daily. [provider]  Active   empagliflozin (JARDIANCE) 10 MG TABS tablet [id number]  Take 1 tablet (10 mg total) by mouth daily. Copland, Harlene BROCKS, MD  Active   glucose blood (FREESTYLE LITE) test strip [id number]  Check blood sugar 1-3x daily Copland, Harlene BROCKS, MD  Active   lisinopril  (ZESTRIL ) 10 MG tablet [id number]  Take 1-2 tablets (10-20 mg total) by mouth daily.  Patient taking differently: Take 10 mg by mouth daily.   Copland, Harlene BROCKS, MD  Active   meloxicam  (MOBIC ) 15  MG tablet [id number]  Take 1 tablet (15 mg total) by mouth daily as needed for pain. Copland, Harlene BROCKS, MD  Active   metFORMIN  (GLUCOPHAGE ) 1000 MG tablet [id number]  TAKE 1 TABLET BY MOUTH TWICE A DAY WITH MEALS Copland, Jessica C, MD  Active   MULTIPLE VITAMIN PO [id number]  Take 1 tablet by mouth daily.  [provider]  Active Self  niacin  500 MG tablet [id number]  Take 500 mg by mouth every morning.  [provider]  Active Self  pantoprazole  (PROTONIX ) 40 MG tablet [id number]  Take 1 tablet (40 mg total) by mouth daily. Copland, Harlene BROCKS, MD  Active   rosuvastatin  (CRESTOR ) 40 MG tablet [id number]  TAKE 1 TABLET BY MOUTH  EVERY DAY Copland, Jessica C, MD  Active   spironolactone  (ALDACTONE ) 25 MG tablet [id number]  Take 1 tablet (25 mg total) by mouth daily. Copland, Harlene BROCKS, MD  Active               Assessment/Plan:   Medication Management / Access:  Reviewed his 2025 [facility] policy [phone] - he has a deductible to meet for any tier 3 or higher medications of $255. So the first time he fills Jardiance the cost will be $255 + $47 = $302 for 30 days or $255+$141 = $396 for 90 day supply.  Cost for Farxiga or dapagliflozin would be similar.   Patient states he will have [employer] - plan 1 for 2026. This plan does not have a deductible and cost for Jardiance would be $47 / month.  Discussed that there is another HTA plan for diabetes and heart care and cost for Jardiance would be $0. Patient will discuss with his insurance agent.  Screened for LIS, Jardiance patient assistance program and Farxiga patient assistance program - household income was above program cut off.   There is an SGLT2 that is available from [facility] pharmacy for about $60 per month - [place] for diabetes treatment. Patient declined this option but he is open to starting Jardiance in 2026 when cost will be lower.   Follow Up Plan: January 2026.   Madelin Ray, PharmD Clinical Pharmacist [facility]

## 2024-09-13 ENCOUNTER — Other Ambulatory Visit: Payer: Self-pay | Admitting: Family Medicine

## 2024-09-13 DIAGNOSIS — E119 Type 2 diabetes mellitus without complications: Secondary | ICD-10-CM

## 2024-09-13 MED ORDER — METFORMIN HCL 1000 MG PO TABS: 1000.0000 mg | ORAL_TABLET | Freq: Two times a day (BID) | ORAL | 2 refills | Status: AC

## 2024-10-12 ENCOUNTER — Other Ambulatory Visit: Payer: Self-pay | Admitting: Family Medicine

## 2024-10-31 ENCOUNTER — Other Ambulatory Visit: Payer: Self-pay | Admitting: Family Medicine

## 2024-10-31 DIAGNOSIS — I10 Essential (primary) hypertension: Secondary | ICD-10-CM

## 2024-11-13 ENCOUNTER — Encounter: Payer: Self-pay | Admitting: Pharmacist

## 2024-11-13 NOTE — Progress Notes (Signed)
 Pharmacy Quality Measure Review  This patient is appearing on a report for failing the adherence measure for hypertension (ACEi/ARB) medications in 2025.    Medication: lisinopril  10mg  - directions are take 1 or 2 tablets by mouth daily but patient reports he take 10mg  or 1 tablet daily Last fill date: 10/30/2024 for 90 day supply or #180 per current directions.   Will collaborate with provider to facilitate refill needs and update Rx at pharmacy.   Madelin Ray, PharmD Clinical Pharmacist [facility]  Population Health [phone]

## 2024-11-15 ENCOUNTER — Other Ambulatory Visit: Payer: Self-pay | Admitting: Family Medicine

## 2024-11-15 DIAGNOSIS — I10 Essential (primary) hypertension: Secondary | ICD-10-CM

## 2024-11-22 ENCOUNTER — Other Ambulatory Visit: Payer: Self-pay | Admitting: Family Medicine
